# Patient Record
Sex: Female | Born: 1990
Health system: Southern US, Community
[De-identification: ages and names within clinical notes are randomized; demographics above are authoritative.]

## PROBLEM LIST (undated history)

## (undated) DIAGNOSIS — K219 Gastro-esophageal reflux disease without esophagitis: Secondary | ICD-10-CM

## (undated) DIAGNOSIS — B999 Unspecified infectious disease: Secondary | ICD-10-CM

## (undated) DIAGNOSIS — K589 Irritable bowel syndrome without diarrhea: Secondary | ICD-10-CM

## (undated) DIAGNOSIS — E669 Obesity, unspecified: Secondary | ICD-10-CM

## (undated) DIAGNOSIS — F32A Depression, unspecified: Secondary | ICD-10-CM

## (undated) DIAGNOSIS — T8859XA Other complications of anesthesia, initial encounter: Secondary | ICD-10-CM

## (undated) DIAGNOSIS — J189 Pneumonia, unspecified organism: Secondary | ICD-10-CM

## (undated) DIAGNOSIS — R569 Unspecified convulsions: Secondary | ICD-10-CM

## (undated) DIAGNOSIS — I10 Essential (primary) hypertension: Secondary | ICD-10-CM

## (undated) DIAGNOSIS — R7303 Prediabetes: Secondary | ICD-10-CM

## (undated) DIAGNOSIS — R519 Headache, unspecified: Secondary | ICD-10-CM

## (undated) DIAGNOSIS — J45909 Unspecified asthma, uncomplicated: Secondary | ICD-10-CM

## (undated) DIAGNOSIS — G4730 Sleep apnea, unspecified: Secondary | ICD-10-CM

## (undated) DIAGNOSIS — D649 Anemia, unspecified: Secondary | ICD-10-CM

## (undated) DIAGNOSIS — E7800 Pure hypercholesterolemia, unspecified: Secondary | ICD-10-CM

## (undated) DIAGNOSIS — F329 Major depressive disorder, single episode, unspecified: Secondary | ICD-10-CM

## (undated) DIAGNOSIS — R51 Headache: Secondary | ICD-10-CM

## (undated) DIAGNOSIS — N390 Urinary tract infection, site not specified: Secondary | ICD-10-CM

## (undated) DIAGNOSIS — F319 Bipolar disorder, unspecified: Secondary | ICD-10-CM

## (undated) DIAGNOSIS — A419 Sepsis, unspecified organism: Secondary | ICD-10-CM

## (undated) DIAGNOSIS — R160 Hepatomegaly, not elsewhere classified: Secondary | ICD-10-CM

## (undated) DIAGNOSIS — F419 Anxiety disorder, unspecified: Secondary | ICD-10-CM

## (undated) DIAGNOSIS — Z9889 Other specified postprocedural states: Secondary | ICD-10-CM

## (undated) DIAGNOSIS — J449 Chronic obstructive pulmonary disease, unspecified: Secondary | ICD-10-CM

## (undated) HISTORY — PX: CHOLECYSTECTOMY: SHX55

## (undated) HISTORY — PX: APPENDECTOMY: SHX54

## (undated) HISTORY — DX: Urinary tract infection, site not specified: N39.0

## (undated) HISTORY — PX: COLONOSCOPY: SHX174

## (undated) HISTORY — DX: Depression, unspecified: F32.A

## (undated) HISTORY — DX: Headache, unspecified: R51.9

## (undated) HISTORY — DX: Pure hypercholesterolemia, unspecified: E78.00

## (undated) HISTORY — DX: Major depressive disorder, single episode, unspecified: F32.9

## (undated) HISTORY — PX: DIAGNOSTIC LAPAROSCOPY: SUR761

## (undated) HISTORY — DX: Anemia, unspecified: D64.9

## (undated) HISTORY — DX: Headache: R51

## (undated) HISTORY — DX: Essential (primary) hypertension: I10

## (undated) HISTORY — DX: Obesity, unspecified: E66.9

## (undated) HISTORY — DX: Irritable bowel syndrome, unspecified: K58.9

## (undated) HISTORY — DX: Anxiety disorder, unspecified: F41.9

---
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
## 2010-02-12 ENCOUNTER — Ambulatory Visit (HOSPITAL_COMMUNITY): Payer: Self-pay | Admitting: Psychiatry

## 2010-06-03 ENCOUNTER — Ambulatory Visit (HOSPITAL_COMMUNITY): Payer: Self-pay | Admitting: Psychiatry

## 2010-07-13 ENCOUNTER — Ambulatory Visit (HOSPITAL_COMMUNITY): Payer: Self-pay | Admitting: Psychiatry

## 2010-08-05 ENCOUNTER — Ambulatory Visit (HOSPITAL_COMMUNITY)
Admission: RE | Admit: 2010-08-05 | Discharge: 2010-08-05 | Payer: Self-pay | Source: Home / Self Care | Attending: Psychiatry | Admitting: Psychiatry

## 2010-09-01 ENCOUNTER — Ambulatory Visit (INDEPENDENT_AMBULATORY_CARE_PROVIDER_SITE_OTHER): Payer: BC Managed Care – PPO | Admitting: Psychology

## 2010-09-01 DIAGNOSIS — F411 Generalized anxiety disorder: Secondary | ICD-10-CM

## 2010-09-15 ENCOUNTER — Encounter (HOSPITAL_COMMUNITY): Payer: BC Managed Care – PPO | Admitting: Psychology

## 2010-09-30 ENCOUNTER — Encounter (HOSPITAL_COMMUNITY): Payer: BC Managed Care – PPO | Admitting: Psychology

## 2010-10-13 ENCOUNTER — Encounter (HOSPITAL_COMMUNITY): Payer: BC Managed Care – PPO | Admitting: Psychology

## 2010-11-04 ENCOUNTER — Encounter (HOSPITAL_COMMUNITY): Payer: Self-pay | Admitting: Psychiatry

## 2010-11-13 ENCOUNTER — Encounter (INDEPENDENT_AMBULATORY_CARE_PROVIDER_SITE_OTHER): Payer: BC Managed Care – PPO | Admitting: Psychiatry

## 2010-11-13 DIAGNOSIS — F339 Major depressive disorder, recurrent, unspecified: Secondary | ICD-10-CM

## 2010-11-13 DIAGNOSIS — F411 Generalized anxiety disorder: Secondary | ICD-10-CM

## 2010-11-24 ENCOUNTER — Encounter (HOSPITAL_COMMUNITY): Payer: BC Managed Care – PPO | Admitting: Psychology

## 2010-12-02 ENCOUNTER — Encounter (INDEPENDENT_AMBULATORY_CARE_PROVIDER_SITE_OTHER): Payer: BC Managed Care – PPO | Admitting: Psychiatry

## 2010-12-02 DIAGNOSIS — F411 Generalized anxiety disorder: Secondary | ICD-10-CM

## 2010-12-04 ENCOUNTER — Encounter (HOSPITAL_COMMUNITY): Payer: BC Managed Care – PPO | Admitting: Psychology

## 2010-12-10 ENCOUNTER — Encounter (HOSPITAL_COMMUNITY): Payer: BC Managed Care – PPO | Admitting: Psychology

## 2011-01-04 ENCOUNTER — Encounter (HOSPITAL_COMMUNITY): Payer: BC Managed Care – PPO | Admitting: Psychiatry

## 2011-01-12 ENCOUNTER — Encounter (HOSPITAL_COMMUNITY): Payer: BC Managed Care – PPO | Admitting: Psychiatry

## 2011-01-13 ENCOUNTER — Encounter (INDEPENDENT_AMBULATORY_CARE_PROVIDER_SITE_OTHER): Payer: BC Managed Care – PPO | Admitting: Psychiatry

## 2011-01-13 DIAGNOSIS — F411 Generalized anxiety disorder: Secondary | ICD-10-CM

## 2011-04-07 ENCOUNTER — Encounter (INDEPENDENT_AMBULATORY_CARE_PROVIDER_SITE_OTHER): Payer: BC Managed Care – PPO | Admitting: Psychiatry

## 2011-04-07 DIAGNOSIS — F411 Generalized anxiety disorder: Secondary | ICD-10-CM

## 2011-05-06 ENCOUNTER — Encounter (INDEPENDENT_AMBULATORY_CARE_PROVIDER_SITE_OTHER): Payer: BC Managed Care – PPO | Admitting: Psychiatry

## 2011-05-06 DIAGNOSIS — F411 Generalized anxiety disorder: Secondary | ICD-10-CM

## 2011-06-14 ENCOUNTER — Other Ambulatory Visit (HOSPITAL_COMMUNITY): Payer: Self-pay | Admitting: Psychiatry

## 2011-06-14 DIAGNOSIS — Q2545 Double aortic arch: Secondary | ICD-10-CM

## 2011-06-14 MED ORDER — DIVALPROEX SODIUM ER 500 MG PO TB24: 1000.0000 mg | ORAL_TABLET | Freq: Every day | ORAL | Status: DC

## 2011-06-21 ENCOUNTER — Other Ambulatory Visit (HOSPITAL_COMMUNITY): Payer: Self-pay | Admitting: Psychiatry

## 2011-06-21 DIAGNOSIS — Q2545 Double aortic arch: Secondary | ICD-10-CM

## 2011-06-21 MED ORDER — DIVALPROEX SODIUM ER 500 MG PO TB24: 1000.0000 mg | ORAL_TABLET | Freq: Every day | ORAL | Status: DC

## 2011-07-02 ENCOUNTER — Encounter (HOSPITAL_COMMUNITY): Payer: Self-pay

## 2011-07-06 ENCOUNTER — Ambulatory Visit (INDEPENDENT_AMBULATORY_CARE_PROVIDER_SITE_OTHER): Payer: BC Managed Care – PPO | Admitting: Psychiatry

## 2011-07-06 VITALS — BP 98/62 | Ht 59.5 in | Wt 140.0 lb

## 2011-07-06 DIAGNOSIS — F411 Generalized anxiety disorder: Secondary | ICD-10-CM

## 2011-07-06 MED ORDER — CLONAZEPAM 0.5 MG PO TABS: 0.2500 mg | ORAL_TABLET | Freq: Two times a day (BID) | ORAL | Status: DC | PRN

## 2011-07-06 NOTE — Progress Notes (Signed)
   [facility] Health Follow-up Outpatient Visit  Jacqueline Jones [facility] 1991/02/08   Subjective: The patient is a 20 year old female who has been followed by [facility] since September of 2010. She is currently diagnosed with generalized anxiety disorder. Patient had multiple hospitalizations throughout the years. Most recently her mom was diagnosed with lung cancer. She presents today with her stepfather. Her mom is currently in remission. The family only found out this last week. Mom still has issues with her left leg which turned gangrenous with insertion of port. The patient presents with her stepfather who she call the police on 2 days ago. According to stepfather the patient will call either mom or stepdad 30-60 times a day. She will not leave a message. There was a health issue with mom last week. Patient reportedly called sister in [place] and scared her. The patient was up all night on the phone. Stepdad got very frustrated with it. He tried to take the phone away. Patient called police on stepdad. Patient was advised to stay away from stepdad for 48 hour period. She is now staying with her boyfriend for 48 hours. The patient did quit smoking a few weeks ago. She has not stolen anymore medications from her mother. The patient does complain today that her Ativan makes her shake. She does have a mild essential tremor. The patient feels like her anxiety is under control at this point. Another concern of stepdad at this point is the patient will wake mom up frequently. Sometimes in the middle in night. Patient has no boundaries.  Filed Vitals:   07/06/11 1030  BP: 98/62    Mental Status Examination  Appearance: Casual, in her pajamas Alert: Yes Attention: fair  Cooperative: Yes Eye Contact: Good Speech: Regular rate rhythm and volume Psychomotor Activity: Normal Memory/Concentration: Intact Oriented: person, place, time/date and situation Mood: Euthymic Affect:  Restricted Thought Processes and Associations: Logical Fund of Knowledge: Fair Thought Content: No suicidal or homicidal thoughts Insight: Fair Judgement: Fair  Diagnosis: Generalized anxiety disorder  Treatment Plan: At this point I will stop her Ativan. I would her Klonopin 0.25 mg twice a day. Patient is advised that I will not go any higher than this. Family meeting. If things do not resolve will refer them to [facility] for family therapy. Mom is to work harder to set boundaries.  Jamse Mead, MD

## 2011-07-21 ENCOUNTER — Other Ambulatory Visit (HOSPITAL_COMMUNITY): Payer: Self-pay | Admitting: Psychiatry

## 2011-07-21 MED ORDER — TRAZODONE HCL 100 MG PO TABS: 300.0000 mg | ORAL_TABLET | Freq: Every day | ORAL | Status: DC

## 2011-07-23 ENCOUNTER — Telehealth (HOSPITAL_COMMUNITY): Payer: Self-pay

## 2011-07-23 NOTE — Telephone Encounter (Signed)
Return phone call the patient. Patient was only be taking one half Klonopin twice a day. She was taking a full one twice a day. She is out early. I documented that I discussed with her that I would not be increasing her Klonopin. Patient is advised that she will not give any early refills. She is to take her medication as prescribed. I will see her back for one more appointment. If she abuse her medication again she will be discharged to practice.

## 2011-08-02 ENCOUNTER — Other Ambulatory Visit (HOSPITAL_COMMUNITY): Payer: Self-pay | Admitting: Psychiatry

## 2011-08-02 MED ORDER — ARIPIPRAZOLE 5 MG PO TABS: 5.0000 mg | ORAL_TABLET | Freq: Every day | ORAL | Status: DC

## 2011-08-06 ENCOUNTER — Telehealth (HOSPITAL_COMMUNITY): Payer: Self-pay

## 2011-08-06 NOTE — Telephone Encounter (Signed)
Says klonopin is not working and that mom has cancer and isn't doing well. Says needs change in medication

## 2011-08-06 NOTE — Telephone Encounter (Signed)
Return phone call to mom. We'll not change medication. Call with concerns.

## 2011-08-13 ENCOUNTER — Telehealth (HOSPITAL_COMMUNITY): Payer: Self-pay

## 2011-08-13 DIAGNOSIS — F411 Generalized anxiety disorder: Secondary | ICD-10-CM

## 2011-08-13 NOTE — Telephone Encounter (Signed)
PT WANTS TO BE SEEN SOONER THAN Tuesday BECAUSE SHE HAS BEEN SHAKING SO MUCH AND SHE CAN'T TAKE IT. SHE DOESN'T KNOW WHAT IT IS FROM. PLEASE CALL ASAP. THANKS

## 2011-08-16 MED ORDER — CLONAZEPAM 0.5 MG PO TABS: 0.5000 mg | ORAL_TABLET | Freq: Two times a day (BID) | ORAL | Status: DC

## 2011-08-16 NOTE — Telephone Encounter (Signed)
Return phone call to patient. Patient is shaking and can't help it. She is asking for something. I will double her Klonopin. I advised I will not go any higher. If I discovered that she is abusing it, she will be discharged from his practice.

## 2011-08-17 ENCOUNTER — Ambulatory Visit (HOSPITAL_COMMUNITY): Payer: Self-pay | Admitting: Psychiatry

## 2011-08-18 ENCOUNTER — Telehealth (HOSPITAL_COMMUNITY): Payer: Self-pay

## 2011-08-18 NOTE — Telephone Encounter (Signed)
Patient called again while I was main office. Spoke to her. Advised her that I only change her Klonopin 2 days ago. Patient also canceled appointment yesterday which was a same-day canceled. This is considered to be a no-show. I will not make any changes at this time. I will see patient next week.

## 2011-09-02 ENCOUNTER — Encounter (HOSPITAL_COMMUNITY): Payer: Self-pay | Admitting: Psychiatry

## 2011-09-02 ENCOUNTER — Ambulatory Visit (INDEPENDENT_AMBULATORY_CARE_PROVIDER_SITE_OTHER): Payer: Medicaid Other | Admitting: Psychiatry

## 2011-09-02 VITALS — BP 98/62 | Ht 59.5 in | Wt 151.0 lb

## 2011-09-02 DIAGNOSIS — F411 Generalized anxiety disorder: Secondary | ICD-10-CM | POA: Insufficient documentation

## 2011-09-02 MED ORDER — DOXEPIN HCL 10 MG PO CAPS: 10.0000 mg | ORAL_CAPSULE | Freq: Every day | ORAL | Status: DC

## 2011-09-02 NOTE — Progress Notes (Signed)
   [facility] Follow-up Outpatient Visit  Emilyann Banka [facility] 1990/11/27   Subjective: The patient is a 21 year old female who has been followed by [facility] since September of 2010. She is currently diagnosed with generalized anxiety disorder. The patient's last appointment, I switched her from the Ativan back to the Klonopin secondary to anxiety. I told her I would not go any higher than a quarter milligram twice a day. Patient is called 5 times in the interim. I did increase the Klonopin to 0.5 twice a day. She presents today by herself. She reports she is more depressed. She broke up with her boyfriend approximately 3 months ago. She's been staying home and not going out. She's been crying a lot and shaking. She denies any suicidal thoughts. She has not been sleeping well and is not having any. She has an increase in appetite and is up 11 pounds. She is not currently on an antidepressant. She is concerned about mom, because she feels that mom is abusing her pain medication.  Filed Vitals:   09/02/11 1437  BP: 98/62    Mental Status Examination  Appearance: Casual Attention: fair  Cooperative: Yes Eye Contact: Good Speech: Regular rate rhythm and volume Psychomotor Activity: Normal Memory/Concentration: Intact Oriented: person, place, time/date and situation Mood: Euthymic Affect: Restricted Thought Processes and Associations: Logical Fund of Knowledge: Fair Thought Content: No suicidal or homicidal thoughts Insight: Fair Judgement: Fair  Diagnosis: Generalized anxiety disorder  Treatment Plan: We will continue the Klonopin. I have advised patient will not be switching her benzo nor increasing it. We will also continue her Abilify and Depakote ER. I will add doxepin 10 mg daily. I will see her back in one month. Jamse Mead, MD

## 2011-09-29 ENCOUNTER — Other Ambulatory Visit (HOSPITAL_COMMUNITY): Payer: Self-pay | Admitting: Psychiatry

## 2011-09-29 DIAGNOSIS — F411 Generalized anxiety disorder: Secondary | ICD-10-CM

## 2011-09-29 MED ORDER — CLONAZEPAM 0.5 MG PO TABS: 0.5000 mg | ORAL_TABLET | Freq: Two times a day (BID) | ORAL | Status: DC

## 2011-09-30 ENCOUNTER — Encounter (HOSPITAL_COMMUNITY): Payer: Self-pay | Admitting: Psychiatry

## 2011-09-30 ENCOUNTER — Ambulatory Visit (INDEPENDENT_AMBULATORY_CARE_PROVIDER_SITE_OTHER): Payer: Medicaid Other | Admitting: Psychiatry

## 2011-09-30 VITALS — BP 112/72 | Ht 59.5 in | Wt 148.0 lb

## 2011-09-30 DIAGNOSIS — F411 Generalized anxiety disorder: Secondary | ICD-10-CM

## 2011-09-30 MED ORDER — CLONIDINE HCL 0.1 MG PO TABS: 0.1000 mg | ORAL_TABLET | Freq: Every day | ORAL | Status: DC

## 2011-09-30 NOTE — Progress Notes (Signed)
   [facility] Follow-up Outpatient Visit  Jacqueline Jones [facility] Nov 19, 1990   Subjective: The patient is a 21 year old female who has been followed by [facility] since September of 2010. She is currently diagnosed with generalized anxiety disorder. Patient last appointment, I continued her Klonopin, Abilify, and Depakote. I started her on doxepin 10 mg daily. The patient presents today looking like she is taking care of herself. She recently got her hair dyed and cut. She is wearing makeup. She is not wearing pajamas. She reports that her anxiety is under control. She "loves the new medicine". It does not make her tired. She is concerned today because she states that her mother is popping pain pills. Her mother has been falling. Patient realizes she is addicted but mother continues to use. Patient is not sleeping at night. She has tried trazodone up to 300 mg at bedtime which didn't help. Remeron caused weight gain. Melatonin and Benadryl did not help.  Filed Vitals:   09/30/11 1419  BP: 112/72    Mental Status Examination  Appearance: Casual Attention: fair  Cooperative: Yes Eye Contact: Good Speech: Regular rate rhythm and volume Psychomotor Activity: Normal Memory/Concentration: Intact Oriented: person, place, time/date and situation Mood: Euthymic Affect: Restricted Thought Processes and Associations: Logical Fund of Knowledge: Fair Thought Content: No suicidal or homicidal thoughts Insight: Fair Judgement: Fair  Diagnosis: Generalized anxiety disorder  Treatment Plan: We will continue the Klonopin, Abilify, Depakote, and doxepin. I will add clonidine 0.1 mg at night to help with sleep. I will see her back in one month. Patient to call with concerns. Jamse Mead, MD

## 2011-11-01 ENCOUNTER — Telehealth (HOSPITAL_COMMUNITY): Payer: Self-pay

## 2011-11-01 NOTE — Telephone Encounter (Signed)
Mom called report the patient is severely depressed. She walks around crying all the time. She continues to isolate. I gave home from dinner the other night, and the patient had drank a bottle. He along with a bottle of Bailey's. Mom sees this as self-medicating. Mom is asking if we can try changing her medication or going up on the Prozac. Patient is to keep her appointment.

## 2011-11-01 NOTE — Telephone Encounter (Signed)
Mom needs to speak to you before appt on Wednesday would not disclose what about

## 2011-11-03 ENCOUNTER — Ambulatory Visit (INDEPENDENT_AMBULATORY_CARE_PROVIDER_SITE_OTHER): Payer: Medicaid Other | Admitting: Psychiatry

## 2011-11-03 ENCOUNTER — Encounter (HOSPITAL_COMMUNITY): Payer: Self-pay | Admitting: Psychiatry

## 2011-11-03 VITALS — BP 98/62 | Ht 59.5 in | Wt 150.0 lb

## 2011-11-03 DIAGNOSIS — F411 Generalized anxiety disorder: Secondary | ICD-10-CM

## 2011-11-03 DIAGNOSIS — F39 Unspecified mood [affective] disorder: Secondary | ICD-10-CM

## 2011-11-03 LAB — COMPREHENSIVE METABOLIC PANEL
ALT: 9 U/L (ref 0–35)
BUN: 13 mg/dL (ref 6–23)
CO2: 24 mEq/L (ref 19–32)
Creat: 0.78 mg/dL (ref 0.50–1.10)
Total Bilirubin: 0.3 mg/dL (ref 0.3–1.2)

## 2011-11-03 LAB — LIPID PANEL
HDL: 49 mg/dL (ref >39)
LDL Cholesterol: 112 mg/dL — ABNORMAL HIGH (ref 0–99)
Triglycerides: 96 mg/dL (ref <150)

## 2011-11-03 LAB — CBC WITH DIFFERENTIAL/PLATELET
Hemoglobin: 14.1 g/dL (ref 12.0–15.0)
Lymphocytes Relative: 33 % (ref 12–46)
Lymphs Abs: 2.3 10*3/uL (ref 0.7–4.0)
MCH: 29.1 pg (ref 26.0–34.0)
Monocytes Relative: 7 % (ref 3–12)
Neutro Abs: 4 10*3/uL (ref 1.7–7.7)
Neutrophils Relative %: 57 % (ref 43–77)
RBC: 4.84 MIL/uL (ref 3.87–5.11)
WBC: 7.1 10*3/uL (ref 4.0–10.5)

## 2011-11-03 LAB — HEMOGLOBIN A1C
Hgb A1c MFr Bld: 5.1 % (ref <5.7)
Mean Plasma Glucose: 100 mg/dL (ref <117)

## 2011-11-03 MED ORDER — DOXEPIN HCL 10 MG PO CAPS
10.0000 mg | ORAL_CAPSULE | Freq: Two times a day (BID) | ORAL | Status: DC
Start: 2011-11-03 — End: 2012-02-08

## 2011-11-03 MED ORDER — MIRTAZAPINE 15 MG PO TBDP: 15.0000 mg | ORAL_TABLET | Freq: Every day | ORAL | Status: DC

## 2011-11-03 NOTE — Progress Notes (Signed)
   [facility] Health Follow-up Outpatient Visit  Lavina Resor [facility] [birth year]   Subjective: The patient is a 21 year old female who has been followed by [facility] since September of 2010. She is currently diagnosed with generalized anxiety disorder. Her last appointment, I continued her Klonopin, Abilify, Depakote, and doxepin. I added clonidine to help with insomnia. Mom called yesterday and reported the patient was severely depressed and that I needed to go up on her antidepressant. Patient is not on an antidepressant. Patient presents today by herself. She stopped the clonidine because it didn't help her sleep. She does report that she has been very depressed. She's been mopey and sad. There've been no suicidal thoughts. We discussed the fact that Remeron helped her sleep in the past but caused to eat more. She is willing to try it again. I also would do double duty as an antidepressant. She feels that doxepin is helping a little bit. She reports that she went to the emergency room with back pain last night. They gave her a cortisol shot and Ativan to calm down.  Filed Vitals:   11/03/11 0958  BP: 98/62    Mental Status Examination  Appearance: Casual Attention: fair  Cooperative: Yes Eye Contact: Good Speech: Regular rate rhythm and volume Psychomotor Activity: Normal Memory/Concentration: Intact Oriented: person, place, time/date and situation Mood: Euthymic Affect: Restricted Thought Processes and Associations: Logical Fund of Knowledge: Fair Thought Content: No suicidal or homicidal thoughts Insight: Fair Judgement: Fair  Diagnosis: Generalized anxiety disorder  Treatment Plan: We will continue the Klonopin, Abilify, Depakote, and doxepin. We will discontinue the clonidine, since patient is not taking it. We will start Remeron 15 mg at bedtime. Patient may try half tablet first. I will increase her doxepin to twice a day. Patient is not eating this  morning. We will go ahead and get a Depakote level, CBC, CMP, hemoglobin A1c, and lipid panel. I will see the patient back in 2 months. Jamse Mead, MD

## 2011-11-04 LAB — VALPROIC ACID LEVEL: Valproic Acid Lvl: <1 ug/mL — ABNORMAL LOW (ref 50.0–100.0)

## 2011-11-16 ENCOUNTER — Telehealth (HOSPITAL_COMMUNITY): Payer: Self-pay

## 2011-11-16 NOTE — Telephone Encounter (Signed)
Called patient to review labs. Patient reports she has not been taking her Depakote. We will discontinue it. She states that she will work on her cholesterol level. We reviewed medications.

## 2011-11-16 NOTE — Telephone Encounter (Signed)
Would like results of blood work

## 2012-01-03 ENCOUNTER — Other Ambulatory Visit (HOSPITAL_COMMUNITY): Payer: Self-pay | Admitting: Psychiatry

## 2012-01-03 DIAGNOSIS — F411 Generalized anxiety disorder: Secondary | ICD-10-CM

## 2012-01-03 MED ORDER — CLONAZEPAM 0.5 MG PO TABS: 0.5000 mg | ORAL_TABLET | Freq: Two times a day (BID) | ORAL | Status: DC

## 2012-01-04 ENCOUNTER — Encounter (HOSPITAL_COMMUNITY): Payer: Self-pay | Admitting: Psychiatry

## 2012-01-04 ENCOUNTER — Ambulatory Visit (INDEPENDENT_AMBULATORY_CARE_PROVIDER_SITE_OTHER): Payer: Medicaid Other | Admitting: Psychiatry

## 2012-01-04 VITALS — BP 100/62 | Ht 59.5 in | Wt 149.0 lb

## 2012-01-04 DIAGNOSIS — F411 Generalized anxiety disorder: Secondary | ICD-10-CM

## 2012-01-04 MED ORDER — ARIPIPRAZOLE 5 MG PO TABS: 5.0000 mg | ORAL_TABLET | Freq: Every day | ORAL | Status: DC

## 2012-01-04 NOTE — Progress Notes (Signed)
   [facility] Health Follow-up Outpatient Visit  Jacqueline Jones [facility] 1991-05-06   Subjective: The patient is a 21 year old female who has been followed by [facility] since September of 2010. She is currently diagnosed with generalized anxiety disorder. At her last appointment, I increased her doxepin to twice a day. I also started her on Remeron 15 mg at bedtime to help with sleep. I continued her Klonopin and Abilify. We had basic blood work done. Her Depakote level was very low. She reports that she hasn't been taking it. The patient presents today in tears. Her mother's cancer is back. The mother has been given 1 months to live. The patient has been removed from mom's home, and is living with her 56 year old sister. Stepdad does not want her there. The patient reports that she is very upset. She has been taking the Remeron, because it caused weight gain. She is actually down a pound today. Her sister is on Xanax, and has been given the patient some to calm her down.  Filed Vitals:   01/04/12 1051  BP: 100/62    Mental Status Examination  Appearance: Casual Attention: fair  Cooperative: Yes Eye Contact: Good Speech: Regular rate rhythm and volume Psychomotor Activity: Normal Memory/Concentration: Intact Oriented: person, place, time/date and situation Mood: Euthymic Affect: Restricted Thought Processes and Associations: Logical Fund of Knowledge: Fair Thought Content: No suicidal or homicidal thoughts Insight: Fair Judgement: Fair  Diagnosis: Generalized anxiety disorder  Treatment Plan: We will continue the Klonopin, Abilify,and doxepin. I will check on the patient one month. She is advised that she may take Remeron to sporadically if she needs it for sleep. She may call me if necessary.Marland Kitchen   Jamse Mead, MD

## 2012-01-09 ENCOUNTER — Encounter (HOSPITAL_COMMUNITY): Payer: Self-pay | Admitting: *Deleted

## 2012-01-09 ENCOUNTER — Emergency Department (HOSPITAL_COMMUNITY)
Admission: EM | Admit: 2012-01-09 | Discharge: 2012-01-09 | Disposition: A | Discharge status: Home / Self Care | Payer: Medicaid Other | Attending: Emergency Medicine | Admitting: Emergency Medicine

## 2012-01-09 DIAGNOSIS — R109 Unspecified abdominal pain: Secondary | ICD-10-CM

## 2012-01-09 DIAGNOSIS — N3090 Cystitis, unspecified without hematuria: Secondary | ICD-10-CM | POA: Insufficient documentation

## 2012-01-09 DIAGNOSIS — F329 Major depressive disorder, single episode, unspecified: Secondary | ICD-10-CM | POA: Insufficient documentation

## 2012-01-09 DIAGNOSIS — F3289 Other specified depressive episodes: Secondary | ICD-10-CM | POA: Insufficient documentation

## 2012-01-09 DIAGNOSIS — F411 Generalized anxiety disorder: Secondary | ICD-10-CM | POA: Insufficient documentation

## 2012-01-09 LAB — URINALYSIS, ROUTINE W REFLEX MICROSCOPIC
Nitrite: NEGATIVE
Specific Gravity, Urine: 1.027 (ref 1.005–1.030)
Urobilinogen, UA: 0.2 mg/dL (ref 0.0–1.0)
pH: 6 (ref 5.0–8.0)

## 2012-01-09 LAB — URINE MICROSCOPIC-ADD ON

## 2012-01-09 MED ORDER — OXYCODONE-ACETAMINOPHEN 5-325 MG PO TABS
2.0000 | ORAL_TABLET | Freq: Once | ORAL | Status: AC
  Administered 2012-01-09: 2 via ORAL
  Filled 2012-01-09: qty 2

## 2012-01-09 MED ORDER — TRAMADOL HCL 50 MG PO TABS: 50.0000 mg | ORAL_TABLET | Freq: Four times a day (QID) | ORAL | Status: DC | PRN

## 2012-01-09 MED ORDER — TRAMADOL HCL 50 MG PO TABS: 50.0000 mg | ORAL_TABLET | Freq: Four times a day (QID) | ORAL | Status: AC | PRN

## 2012-01-09 NOTE — Discharge Instructions (Signed)
Flank Pain Flank pain refers to pain that is located on the side of the body between the upper abdomen and the back. It can be caused by many things. CAUSES  Some of the more common causes of flank pain include:  Muscle strain.   Muscle spasms.   A disease of your spine (vertebral disk disease).   A lung infection (pneumonia).   Fluid around your lungs (pulmonary edema).   A kidney infection.   Kidney stones.   A very painful skin rash on only one side of your body (shingles).   Gallbladder disease.  DIAGNOSIS  Blood tests, urine tests, and X-rays may help your caregiver determine what is wrong. TREATMENT  The treatment of pain depends on the cause. Your caregiver will determine what treatment will work best for you. HOME CARE INSTRUCTIONS   Home care will depend on the cause of your pain.   Some medications may help relieve the pain. Take medication for relief of pain as directed by your caregiver.   Tell your caregiver about any changes in your pain.   Follow up with your caregiver.  SEEK IMMEDIATE MEDICAL CARE IF:   Your pain is not controlled with medication.   The pain increases.   You have abdominal pain.   You have shortness of breath.   You have persistent nausea or vomiting.   You have swelling in your abdomen.   You feel faint or pass out.   You have a temperature by mouth above 102 F (38.9 C), not controlled by medicine.  MAKE SURE YOU:   Understand these instructions.   Will watch your condition.   Will get help right away if you are not doing well or get worse.  Document Released: 09/02/2005 Document Revised: 07/01/2011 Document Reviewed: 12/27/2009 Exit[employer] Patient Information 2012 [employer]. 

## 2012-01-09 NOTE — ED Provider Notes (Signed)
History     CSN: [id number]  Arrival date & time 01/09/12  2128   First MD Initiated Contact with Patient 01/09/12 2243      Chief Complaint  Patient presents with  . Cystitis    (Consider location/radiation/quality/duration/timing/severity/associated sxs/prior treatment) The history is provided by the patient.   Patient here with flank pain x2 weeks. She has been taking antibiotics for urinary tract infection. Denies any fever, vomiting, vaginal bleeding or discharge. No current dysuria or hematuria. No prior history of renal colic. She has 3 days left of her antibiotic to take. She denies any pain medication currently Past Medical History  Diagnosis Date  . Anxiety   . Depression   . Anxiety   . Depression     History reviewed. No pertinent past surgical history.  Family History  Problem Relation Age of Onset  . Drug abuse Father   . Drug abuse Sister   . Drug abuse Brother     History  Substance Use Topics  . Smoking status: Never Smoker   . Smokeless tobacco: Never Used  . Alcohol Use: No    OB History    Grav Para Term Preterm Abortions TAB SAB Ect Mult Living                  Review of Systems  All other systems reviewed and are negative.    Allergies  Adhesive; Gabapentin; and Latex  Home Medications   Current Outpatient Rx  Name Route Sig Dispense Refill  . ARIPIPRAZOLE 5 MG PO TABS Oral Take 1 tablet (5 mg total) by mouth daily. 30 tablet 2  . CLONAZEPAM 0.5 MG PO TABS Oral Take 1 tablet (0.5 mg total) by mouth 2 (two) times daily. 60 tablet 1  . DOXEPIN HCL 10 MG PO CAPS Oral Take 1 capsule (10 mg total) by mouth 2 (two) times daily. 60 capsule 2  . IBUPROFEN 200 MG PO TABS Oral Take 200 mg by mouth every 6 (six) hours as needed. Pain    . CLONAZEPAM 0.5 MG PO TABS Oral Take 0.5 tablets (0.25 mg total) by mouth 2 (two) times daily as needed for anxiety. 30 tablet 2    BP 118/80  Pulse 105  Temp 98.4 F (36.9 C) (Oral)  Resp 16  Ht 5'  1" (1.549 m)  Wt 152 lb (68.947 kg)  BMI 28.72 kg/m2  SpO2 97%  LMP 12/20/2011  Physical Exam  Nursing note and vitals reviewed. Constitutional: She is oriented to person, place, and time. She appears well-developed and well-nourished.  Non-toxic appearance. No distress.  HENT:  Head: Normocephalic and atraumatic.  Eyes: Conjunctivae, EOM and lids are normal. Pupils are equal, round, and reactive to light.  Neck: Normal range of motion. Neck supple. No tracheal deviation present. No mass present.  Cardiovascular: Regular rhythm and normal heart sounds.  Tachycardia present.  Exam reveals no gallop.   No murmur heard. Pulmonary/Chest: Effort normal and breath sounds normal. No stridor. No respiratory distress. She has no decreased breath sounds. She has no wheezes. She has no rhonchi. She has no rales.  Abdominal: Soft. Normal appearance and bowel sounds are normal. She exhibits no distension. There is no tenderness. There is no rigidity, no rebound, no guarding and no CVA tenderness.  Musculoskeletal: Normal range of motion. She exhibits no edema and no tenderness.  Neurological: She is alert and oriented to person, place, and time. She has normal strength. No cranial nerve deficit or sensory deficit.  GCS eye subscore is 4. GCS verbal subscore is 5. GCS motor subscore is 6.  Skin: Skin is warm and dry. No abrasion and no rash noted.  Psychiatric: She has a normal mood and affect. Her speech is normal and behavior is normal.    ED Course  Procedures (including critical care time)  Labs Reviewed  URINALYSIS, ROUTINE W REFLEX MICROSCOPIC - Abnormal; Notable for the following:    APPearance CLOUDY (*)     Leukocytes, UA SMALL (*)     All other components within normal limits  URINE MICROSCOPIC-ADD ON - Abnormal; Notable for the following:    Squamous Epithelial / LPF MANY (*)     Bacteria, UA FEW (*)     All other components within normal limits  URINE CULTURE   No results  found.   No diagnosis found.    MDM  Pt given percocet for pain--urine culture sent, current ua likely contaminated--will f/u her pcp--urine without blood, doubt renal colic, no concern for pyelo        Toy Baker, MD 01/09/12 2259

## 2012-01-09 NOTE — ED Notes (Signed)
Patient from home with c/o bladder infection. Tenderness upon palpation of both flanks. Sharp pain 10/10 non radiating. No urgency, frequency or problems with urination. Patient currently on cipro for bladder infection diagnosed 2 weeks ago.

## 2012-01-12 LAB — URINE CULTURE

## 2012-01-13 NOTE — ED Notes (Signed)
+   Urine Chart sent to EDP office for review. 

## 2012-01-14 NOTE — ED Notes (Signed)
Chart returned from EDP office. Prescribed Macrobid 100 mg. One tablet po BID x 5 days. Prescribed by Heather VanWingen PA-C. 

## 2012-01-15 NOTE — ED Notes (Signed)
Attempted to contact patient. # is not correct.

## 2012-02-04 ENCOUNTER — Encounter (HOSPITAL_COMMUNITY): Payer: Self-pay | Admitting: Psychiatry

## 2012-02-04 ENCOUNTER — Ambulatory Visit (INDEPENDENT_AMBULATORY_CARE_PROVIDER_SITE_OTHER): Payer: Medicaid Other | Admitting: Psychiatry

## 2012-02-04 VITALS — BP 102/64 | Ht 59.5 in | Wt 156.0 lb

## 2012-02-04 DIAGNOSIS — F411 Generalized anxiety disorder: Secondary | ICD-10-CM

## 2012-02-04 NOTE — Progress Notes (Signed)
   [facility] Health Follow-up Outpatient Visit  Burnadette Baskett [facility] June 20, 1991   Subjective: The patient is a 21 year old female who has been followed by [facility] since September of 2010. She is currently diagnosed with generalized anxiety disorder. At her last appointment, I did not make any changes. She presents today alone. Her mom is now in hospice. She is living at the hospice facility. The patient is living with her sister. Her sister has 3 stepchildren in their late teens early 15s, and one son 68. The patient has her own room there. She likes being there. She has seen her stepdad some, but her stepdad does not want her living at the house. She feels more depressed and anxious. 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 She is now back with her boyfriend. This appears to be going well. She is willing to try to go up on the doxepin.  Filed Vitals:   02/04/12 1124  BP: 102/64    Mental Status Examination  Appearance: Casual Attention: fair  Cooperative: Yes Eye Contact: Good Speech: Regular rate rhythm and volume Psychomotor Activity: Normal Memory/Concentration: Intact Oriented: person, place, time/date and situation Mood: Euthymic Affect: Restricted Thought Processes and Associations: Logical Fund of Knowledge: Fair Thought Content: No suicidal or homicidal thoughts Insight: Fair Judgement: Fair  Diagnosis: Generalized anxiety disorder  Treatment Plan: I will increase the patient's doxepin to 20 mg twice a day. We will continue the Klonopin, Abilify, and trazodone the patient is taking. I will see her back in one month.  Jamse Mead, MD

## 2012-02-08 ENCOUNTER — Other Ambulatory Visit (HOSPITAL_COMMUNITY): Payer: Self-pay | Admitting: Psychiatry

## 2012-02-24 ENCOUNTER — Ambulatory Visit (INDEPENDENT_AMBULATORY_CARE_PROVIDER_SITE_OTHER): Payer: Medicaid Other | Admitting: Behavioral Health

## 2012-02-24 ENCOUNTER — Telehealth (HOSPITAL_COMMUNITY): Payer: Self-pay

## 2012-02-24 DIAGNOSIS — F411 Generalized anxiety disorder: Secondary | ICD-10-CM

## 2012-02-24 NOTE — Telephone Encounter (Signed)
Patient is scheduled with Dr. Demetrius Charity. on Monday.

## 2012-02-25 ENCOUNTER — Encounter (HOSPITAL_COMMUNITY): Payer: Self-pay | Admitting: Behavioral Health
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
## 2012-02-28 ENCOUNTER — Ambulatory Visit (INDEPENDENT_AMBULATORY_CARE_PROVIDER_SITE_OTHER): Payer: Medicaid Other | Admitting: Psychiatry

## 2012-02-28 ENCOUNTER — Ambulatory Visit (HOSPITAL_COMMUNITY): Payer: Self-pay | Admitting: Psychology

## 2012-02-28 ENCOUNTER — Encounter (HOSPITAL_COMMUNITY): Payer: Self-pay | Admitting: Psychiatry

## 2012-02-28 VITALS — BP 122/80 | HR 99 | Ht 59.5 in | Wt 152.0 lb

## 2012-02-28 DIAGNOSIS — F411 Generalized anxiety disorder: Secondary | ICD-10-CM

## 2012-02-28 DIAGNOSIS — F1920 Other psychoactive substance dependence, uncomplicated: Secondary | ICD-10-CM | POA: Insufficient documentation

## 2012-02-28 MED ORDER — CLONAZEPAM 0.5 MG PO TABS: ORAL_TABLET | ORAL | Status: DC

## 2012-02-28 MED ORDER — HYDROXYZINE PAMOATE 25 MG PO CAPS: 25.0000 mg | ORAL_CAPSULE | Freq: Three times a day (TID) | ORAL | Status: AC | PRN

## 2012-02-28 MED ORDER — ARIPIPRAZOLE 10 MG PO TABS: 10.0000 mg | ORAL_TABLET | Freq: Every day | ORAL | Status: DC
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
## 2012-03-06 ENCOUNTER — Ambulatory Visit (HOSPITAL_COMMUNITY): Payer: Self-pay | Admitting: Psychiatry

## 2012-03-13 ENCOUNTER — Ambulatory Visit (HOSPITAL_COMMUNITY): Payer: Self-pay | Admitting: Behavioral Health

## 2012-03-17 ENCOUNTER — Other Ambulatory Visit (HOSPITAL_COMMUNITY): Payer: Self-pay | Admitting: Psychiatry

## 2012-03-17 ENCOUNTER — Telehealth (HOSPITAL_COMMUNITY): Payer: Self-pay

## 2012-03-17 MED ORDER — DOXEPIN HCL 10 MG PO CAPS: 40.0000 mg | ORAL_CAPSULE | Freq: Two times a day (BID) | ORAL | Status: DC

## 2012-03-17 NOTE — Telephone Encounter (Signed)
Double Doxepin.  Check in next week.

## 2012-03-17 NOTE — Telephone Encounter (Signed)
Hydroxyzine is hyping patient up. We will stop it.

## 2012-03-21 ENCOUNTER — Telehealth (HOSPITAL_COMMUNITY): Payer: Self-pay

## 2012-03-21 NOTE — Telephone Encounter (Signed)
Called patient, she denies any SI/HI/AVH. She reports some "jitteriness" and "agitation".  She believes it is most likely due to her taper of clonazepam.  PLAN:  Taper doxepin to original dose of 10 mg BID, by 10 mg daily.  Will ascertain if a different AA is required.

## 2012-03-21 NOTE — Telephone Encounter (Signed)
Needs to discuss meds 

## 2012-03-24 ENCOUNTER — Telehealth (HOSPITAL_COMMUNITY): Payer: Self-pay

## 2012-03-24 MED ORDER — BUSPIRONE HCL 10 MG PO TABS: 10.0000 mg | ORAL_TABLET | Freq: Three times a day (TID) | ORAL | Status: DC

## 2012-03-24 NOTE — Telephone Encounter (Signed)
The patient's mother reports that the patient is not sleeping and appears to be depressed. Patient and patient's mother would like a medication change.   1. Increase trazodone 250 mg 2. Abilify 15 mg daily 3. Start Buspirone 10 mg BID.

## 2012-03-28 ENCOUNTER — Encounter (HOSPITAL_COMMUNITY): Payer: Self-pay | Admitting: Behavioral Health

## 2012-03-28 ENCOUNTER — Ambulatory Visit (HOSPITAL_COMMUNITY): Payer: Self-pay | Admitting: Psychiatry

## 2012-03-29 ENCOUNTER — Telehealth (HOSPITAL_COMMUNITY): Payer: Self-pay | Admitting: Behavioral Health

## 2012-03-29 ENCOUNTER — Encounter (HOSPITAL_COMMUNITY): Payer: Self-pay | Admitting: Behavioral Health

## 2012-03-29 NOTE — Telephone Encounter (Signed)
There was no actual phone call made.

## 2012-03-31 ENCOUNTER — Telehealth (HOSPITAL_COMMUNITY): Payer: Self-pay | Admitting: Psychiatry

## 2012-03-31 DIAGNOSIS — G4700 Insomnia, unspecified: Secondary | ICD-10-CM

## 2012-03-31 DIAGNOSIS — F411 Generalized anxiety disorder: Secondary | ICD-10-CM

## 2012-03-31 MED ORDER — DOXEPIN HCL 10 MG PO CAPS: 10.0000 mg | ORAL_CAPSULE | Freq: Two times a day (BID) | ORAL | Status: DC

## 2012-03-31 MED ORDER — TRAZODONE HCL 100 MG PO TABS
250.0000 mg | ORAL_TABLET | Freq: Every day | ORAL | Status: DC
Start: 2012-03-31 — End: 2012-04-27

## 2012-03-31 MED ORDER — ARIPIPRAZOLE 15 MG PO TABS: 15.0000 mg | ORAL_TABLET | Freq: Every day | ORAL | Status: DC
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
## 2012-04-06 ENCOUNTER — Telehealth (HOSPITAL_COMMUNITY): Payer: Self-pay | Admitting: Psychiatry

## 2012-04-06 NOTE — Telephone Encounter (Signed)
Called patient for follow up care.   As the patient's listed phone numbers were not working, spoke with Jacqueline Jones's soon to be mother-in-law, Jacqueline Jones (Ms. Cletus Gash), as we have a release to speak with her. I informed Jacqueline Jones that Jacqueline Jones, our Environmental health practitioner, was present in the room. Jacqueline Jones reports that the patient is doing well on her medications.  I again asked to confirm when the patient alleged received a phone call from the person identified as "Jacqueline Jones."  The patient and Jacqueline Jones, are in agreement that the phone call was placed at 2:20 PM, but today report the call was on 03/29/2012. I asked if they could provide phone records and [place], replied they could not.   PLAN: Will refer patient to [facility]-patient asked for a referral to Dr. Jean Rosenthal, Dr. Wyvonne Lenz, or Dr. Mayer Camel. She has a 30 day supply of prescriptions. Will fill prescriptions to last to her next appointment.

## 2012-04-07 ENCOUNTER — Telehealth (HOSPITAL_COMMUNITY): Payer: Self-pay | Admitting: Psychiatry

## 2012-04-07 DIAGNOSIS — F411 Generalized anxiety disorder: Secondary | ICD-10-CM

## 2012-04-07 DIAGNOSIS — F1920 Other psychoactive substance dependence, uncomplicated: Secondary | ICD-10-CM

## 2012-04-07 NOTE — Telephone Encounter (Signed)
Referred to outpatient.

## 2012-04-11 ENCOUNTER — Encounter (HOSPITAL_COMMUNITY): Payer: Self-pay | Admitting: Psychiatry

## 2012-04-12 ENCOUNTER — Telehealth (HOSPITAL_COMMUNITY): Payer: Self-pay

## 2012-04-12 NOTE — Telephone Encounter (Signed)
Called the patient in the presence of our administrative assistant, Caryl Asp and Ms. Hyman Hopes and Ms.  The patient reports that the patient is having anxiety, the patient denies SI/HI/AVH.  The patient reports that the patient did not try buspirone as of yet and the  patient was advised to start the same. They were informed of the appointment that was setup for them at [facility], on Tuesday 04/18/2012, by Mrs. Zachery Dauer, for psychiatric follow up and informed that they could call us for any emergencies until they were evaluated by a psychiatrist at [facility].

## 2012-04-12 NOTE — Telephone Encounter (Signed)
Jacqueline Jones called and Jacqueline Jones's anxiety is going through the roof. I explained her referral is in process and that she has been discharged from the practice. She asked you please call

## 2012-04-12 NOTE — Telephone Encounter (Signed)
Called patient

## 2012-04-12 NOTE — Telephone Encounter (Signed)
The patient's future mother-in-law, Chyrl Civatte called. Called all available numbers, no answer on any number.

## 2012-04-23 ENCOUNTER — Other Ambulatory Visit (HOSPITAL_COMMUNITY): Payer: Self-pay | Admitting: Psychiatry

## 2012-04-25 ENCOUNTER — Ambulatory Visit (HOSPITAL_COMMUNITY): Payer: Self-pay | Admitting: Psychiatry

## 2012-04-27 ENCOUNTER — Telehealth (HOSPITAL_COMMUNITY): Payer: Self-pay

## 2012-04-27 DIAGNOSIS — G4700 Insomnia, unspecified: Secondary | ICD-10-CM

## 2012-04-27 DIAGNOSIS — F411 Generalized anxiety disorder: Secondary | ICD-10-CM

## 2012-04-27 DIAGNOSIS — F39 Unspecified mood [affective] disorder: Secondary | ICD-10-CM

## 2012-04-27 NOTE — Telephone Encounter (Addendum)
Called patient.  Patient denies SI/HI/AVH. Advised patient to take diphenhydramine 25 mg, for insomnia and go to ER with any SI/HI/AVH or medication side effects. The patient reports her appointment was cancelled. Provided patient with number to [facility].

## 2012-04-28 DIAGNOSIS — F39 Unspecified mood [affective] disorder: Secondary | ICD-10-CM | POA: Insufficient documentation

## 2012-04-28 MED ORDER — TRAZODONE HCL 100 MG PO TABS: 300.0000 mg | ORAL_TABLET | Freq: Every day | ORAL | Status: DC

## 2012-04-28 MED ORDER — ARIPIPRAZOLE 15 MG PO TABS: 15.0000 mg | ORAL_TABLET | Freq: Every day | ORAL | Status: DC

## 2012-12-22 DIAGNOSIS — G2581 Restless legs syndrome: Secondary | ICD-10-CM | POA: Diagnosis not present

## 2012-12-22 DIAGNOSIS — F3289 Other specified depressive episodes: Secondary | ICD-10-CM | POA: Diagnosis not present

## 2012-12-22 DIAGNOSIS — F341 Dysthymic disorder: Secondary | ICD-10-CM | POA: Diagnosis not present

## 2012-12-22 DIAGNOSIS — F411 Generalized anxiety disorder: Secondary | ICD-10-CM | POA: Diagnosis not present

## 2012-12-22 DIAGNOSIS — F329 Major depressive disorder, single episode, unspecified: Secondary | ICD-10-CM | POA: Diagnosis not present

## 2013-01-30 DIAGNOSIS — R5383 Other fatigue: Secondary | ICD-10-CM | POA: Diagnosis not present

## 2013-01-30 DIAGNOSIS — Z23 Encounter for immunization: Secondary | ICD-10-CM | POA: Diagnosis not present

## 2013-01-30 DIAGNOSIS — F411 Generalized anxiety disorder: Secondary | ICD-10-CM | POA: Diagnosis not present

## 2013-01-30 DIAGNOSIS — G4700 Insomnia, unspecified: Secondary | ICD-10-CM | POA: Diagnosis not present

## 2013-01-30 DIAGNOSIS — F3289 Other specified depressive episodes: Secondary | ICD-10-CM | POA: Diagnosis not present

## 2013-01-30 DIAGNOSIS — K5900 Constipation, unspecified: Secondary | ICD-10-CM | POA: Diagnosis not present

## 2013-01-30 DIAGNOSIS — F329 Major depressive disorder, single episode, unspecified: Secondary | ICD-10-CM | POA: Diagnosis not present

## 2013-01-30 DIAGNOSIS — R5381 Other malaise: Secondary | ICD-10-CM | POA: Diagnosis not present

## 2013-01-30 DIAGNOSIS — K219 Gastro-esophageal reflux disease without esophagitis: Secondary | ICD-10-CM | POA: Diagnosis not present

## 2013-01-30 DIAGNOSIS — F840 Autistic disorder: Secondary | ICD-10-CM | POA: Diagnosis not present

## 2013-02-16 DIAGNOSIS — Z309 Encounter for contraceptive management, unspecified: Secondary | ICD-10-CM | POA: Diagnosis not present

## 2013-02-23 ENCOUNTER — Emergency Department (HOSPITAL_COMMUNITY)
Admission: EM | Admit: 2013-02-23 | Discharge: 2013-02-24 | Discharge status: Against Medical Advice | Payer: Self-pay | Attending: Emergency Medicine | Admitting: Emergency Medicine

## 2013-02-23 DIAGNOSIS — F419 Anxiety disorder, unspecified: Secondary | ICD-10-CM

## 2013-02-23 DIAGNOSIS — J449 Chronic obstructive pulmonary disease, unspecified: Secondary | ICD-10-CM | POA: Insufficient documentation

## 2013-02-23 DIAGNOSIS — IMO0002 Reserved for concepts with insufficient information to code with codable children: Secondary | ICD-10-CM | POA: Insufficient documentation

## 2013-02-23 DIAGNOSIS — F3289 Other specified depressive episodes: Secondary | ICD-10-CM | POA: Insufficient documentation

## 2013-02-23 DIAGNOSIS — F411 Generalized anxiety disorder: Secondary | ICD-10-CM | POA: Insufficient documentation

## 2013-02-23 DIAGNOSIS — F329 Major depressive disorder, single episode, unspecified: Secondary | ICD-10-CM | POA: Insufficient documentation

## 2013-02-23 DIAGNOSIS — Z91040 Latex allergy status: Secondary | ICD-10-CM | POA: Insufficient documentation

## 2013-02-23 DIAGNOSIS — F17200 Nicotine dependence, unspecified, uncomplicated: Secondary | ICD-10-CM | POA: Insufficient documentation

## 2013-02-23 DIAGNOSIS — J4489 Other specified chronic obstructive pulmonary disease: Secondary | ICD-10-CM | POA: Insufficient documentation

## 2013-02-23 DIAGNOSIS — Z79899 Other long term (current) drug therapy: Secondary | ICD-10-CM | POA: Insufficient documentation

## 2013-02-23 HISTORY — DX: Chronic obstructive pulmonary disease, unspecified: J44.9

## 2013-02-23 NOTE — ED Notes (Signed)
Per EMS pt states while drinking etoh with a crowd of people pt became upset thinking about deceased mother. Pt was given QVAR inhaler and Xanax by bystanders. Pt states she used entire QVAR inhaler. Pt refused to leave scene without female friend. Pt reports drinking 1/2 5th of liquor.

## 2013-02-24 ENCOUNTER — Encounter (HOSPITAL_COMMUNITY): Payer: Self-pay | Admitting: Emergency Medicine

## 2013-02-24 NOTE — ED Provider Notes (Signed)
CSN: [id number]     Arrival date & time 02/23/13  2351 History     First MD Initiated Contact with Patient 02/24/13 0122     Chief Complaint  Patient presents with  . Panic Attack   (Consider location/radiation/quality/duration/timing/severity/associated sxs/prior Treatment) The history is provided by the patient and medical records.   Pt with PMH significant for depression and anxiety presents to the ED via EMS with female friend for anxiety attack.  Pt states she has been under a great deal of stress since her mom recently passed away.  States she was drinking EtOH with friends and became very upset by recurrent thoughts of her mother and how she is no longer here with her.  Pt states she was given QVAR inhaler and xanax by other friends drinking with them.  States she used the entire QVAR inhaler.  She started to feel "funny" and got a sensation that her heart was racing causing her to panic even more.  Approx 1/2 5th liqour on board.  Denies SI/HI/AVH.  Past Medical History  Diagnosis Date  . Anxiety   . Depression   . Anxiety   . Depression   . COPD (chronic obstructive pulmonary disease)    History reviewed. No pertinent past surgical history. Family History  Problem Relation Age of Onset  . Drug abuse Father   . Drug abuse Sister   . Drug abuse Brother    History  Substance Use Topics  . Smoking status: Current Every Day Smoker -- 1.00 packs/day for 10 years    Types: Cigarettes  . Smokeless tobacco: Not on file  . Alcohol Use: 2.0 oz/week    4 drink(s) per week     Comment: 4 drinks   OB History   Grav Para Term Preterm Abortions TAB SAB Ect Mult Living                 Review of Systems  Psychiatric/Behavioral: The patient is nervous/anxious.   All other systems reviewed and are negative.    Allergies  Gabapentin; Adhesive; and Latex  Home Medications   Current Outpatient Rx  Name  Route  Sig  Dispense  Refill  . ALPRAZolam (XANAX) 1 MG tablet   Oral  Take 1 mg by mouth 2 (two) times daily as needed for anxiety.         . beclomethasone (QVAR) 80 MCG/ACT inhaler   Inhalation   Inhale 2 puffs into the lungs once as needed.         Marland Kitchen FLUoxetine (PROZAC) 20 MG capsule   Oral   Take 40 mg by mouth every morning.          Marland Kitchen ibuprofen (ADVIL,MOTRIN) 200 MG tablet   Oral   Take 400 mg by mouth every 6 (six) hours as needed for headache. Pain          BP 125/87  Pulse 103  Temp(Src) 98 F (36.7 C) (Oral)  Resp 26  SpO2 96%  LMP 02/17/2013  Physical Exam  Nursing note and vitals reviewed. Constitutional: She is oriented to person, place, and time. She appears well-developed and well-nourished.  HENT:  Head: Normocephalic and atraumatic.  Mouth/Throat: Oropharynx is clear and moist.  Eyes: Conjunctivae and EOM are normal. Pupils are equal, round, and reactive to light.  Neck: Normal range of motion.  Cardiovascular: Normal rate, regular rhythm and normal heart sounds.   Pulmonary/Chest: Effort normal and breath sounds normal.  Abdominal: Soft. Bowel sounds are normal.  Musculoskeletal: Normal range of motion.  Neurological: She is alert and oriented to person, place, and time.  Skin: Skin is warm and dry.  Psychiatric: Her mood appears anxious. Her speech is rapid and/or pressured. She is not actively hallucinating. She expresses no homicidal and no suicidal ideation. She expresses no suicidal plans and no homicidal plans.    ED Course   Procedures (including critical care time)  Labs Reviewed - No data to display No results found.  1. Anxiety     MDM   Upon entering room pt immediately stated "i feel fine now, can i please just go home?"  Pt requested medications to "calm her down".  Advised her if we gave her meds, she would have to stay for 20-30 mins to be monitored for ADRs.  Pt did not want to stay and elected to leave AMA.  Garlon Hatchet, PA-C 02/24/13 0505

## 2013-02-24 NOTE — ED Notes (Signed)
Pt reports taking Xanax 1mg  1 hour ago

## 2013-02-24 NOTE — ED Provider Notes (Signed)
Medical screening examination/treatment/procedure(s) were performed by non-physician practitioner and as supervising physician I was immediately available for consultation/collaboration.  Rigoberto Repass L Emberlee Sortino, MD 02/24/13 0735 

## 2013-02-26 DIAGNOSIS — K219 Gastro-esophageal reflux disease without esophagitis: Secondary | ICD-10-CM | POA: Diagnosis not present

## 2013-02-26 DIAGNOSIS — R0602 Shortness of breath: Secondary | ICD-10-CM | POA: Diagnosis not present

## 2013-02-26 DIAGNOSIS — J209 Acute bronchitis, unspecified: Secondary | ICD-10-CM | POA: Diagnosis not present

## 2013-03-03 ENCOUNTER — Encounter (HOSPITAL_COMMUNITY): Payer: Self-pay | Admitting: *Deleted

## 2013-03-03 ENCOUNTER — Emergency Department (HOSPITAL_COMMUNITY): Payer: Medicare Other

## 2013-03-03 ENCOUNTER — Emergency Department (HOSPITAL_COMMUNITY)
Admission: EM | Admit: 2013-03-03 | Discharge: 2013-03-03 | Disposition: A | Discharge status: Home / Self Care | Payer: Medicare Other | Attending: Emergency Medicine | Admitting: Emergency Medicine

## 2013-03-03 DIAGNOSIS — S59919A Unspecified injury of unspecified forearm, initial encounter: Secondary | ICD-10-CM | POA: Diagnosis not present

## 2013-03-03 DIAGNOSIS — Z3202 Encounter for pregnancy test, result negative: Secondary | ICD-10-CM | POA: Insufficient documentation

## 2013-03-03 DIAGNOSIS — S59909A Unspecified injury of unspecified elbow, initial encounter: Secondary | ICD-10-CM | POA: Diagnosis not present

## 2013-03-03 DIAGNOSIS — R300 Dysuria: Secondary | ICD-10-CM

## 2013-03-03 DIAGNOSIS — S6990XA Unspecified injury of unspecified wrist, hand and finger(s), initial encounter: Secondary | ICD-10-CM | POA: Diagnosis not present

## 2013-03-03 DIAGNOSIS — M79609 Pain in unspecified limb: Secondary | ICD-10-CM | POA: Diagnosis not present

## 2013-03-03 DIAGNOSIS — F17200 Nicotine dependence, unspecified, uncomplicated: Secondary | ICD-10-CM | POA: Insufficient documentation

## 2013-03-03 DIAGNOSIS — Y929 Unspecified place or not applicable: Secondary | ICD-10-CM | POA: Insufficient documentation

## 2013-03-03 DIAGNOSIS — F411 Generalized anxiety disorder: Secondary | ICD-10-CM | POA: Insufficient documentation

## 2013-03-03 DIAGNOSIS — Z79899 Other long term (current) drug therapy: Secondary | ICD-10-CM | POA: Insufficient documentation

## 2013-03-03 DIAGNOSIS — F329 Major depressive disorder, single episode, unspecified: Secondary | ICD-10-CM | POA: Diagnosis not present

## 2013-03-03 DIAGNOSIS — M25539 Pain in unspecified wrist: Secondary | ICD-10-CM | POA: Diagnosis not present

## 2013-03-03 DIAGNOSIS — J4489 Other specified chronic obstructive pulmonary disease: Secondary | ICD-10-CM | POA: Insufficient documentation

## 2013-03-03 DIAGNOSIS — S60229A Contusion of unspecified hand, initial encounter: Secondary | ICD-10-CM | POA: Diagnosis not present

## 2013-03-03 DIAGNOSIS — M25531 Pain in right wrist: Secondary | ICD-10-CM

## 2013-03-03 DIAGNOSIS — Z91040 Latex allergy status: Secondary | ICD-10-CM | POA: Diagnosis not present

## 2013-03-03 DIAGNOSIS — F3289 Other specified depressive episodes: Secondary | ICD-10-CM | POA: Insufficient documentation

## 2013-03-03 DIAGNOSIS — J449 Chronic obstructive pulmonary disease, unspecified: Secondary | ICD-10-CM | POA: Diagnosis not present

## 2013-03-03 DIAGNOSIS — S60221A Contusion of right hand, initial encounter: Secondary | ICD-10-CM

## 2013-03-03 DIAGNOSIS — Y9389 Activity, other specified: Secondary | ICD-10-CM | POA: Insufficient documentation

## 2013-03-03 DIAGNOSIS — IMO0002 Reserved for concepts with insufficient information to code with codable children: Secondary | ICD-10-CM | POA: Insufficient documentation

## 2013-03-03 LAB — URINALYSIS, ROUTINE W REFLEX MICROSCOPIC
Glucose, UA: NEGATIVE mg/dL
Ketones, ur: NEGATIVE mg/dL
Nitrite: NEGATIVE
Specific Gravity, Urine: 1.027 (ref 1.005–1.030)
pH: 6 (ref 5.0–8.0)

## 2013-03-03 LAB — POCT PREGNANCY, URINE: Preg Test, Ur: NEGATIVE

## 2013-03-03 LAB — URINE MICROSCOPIC-ADD ON

## 2013-03-03 MED ORDER — IBUPROFEN 800 MG PO TABS
800.0000 mg | ORAL_TABLET | Freq: Once | ORAL | Status: AC
  Administered 2013-03-03: 800 mg via ORAL
  Filled 2013-03-03: qty 1

## 2013-03-03 NOTE — ED Notes (Signed)
Ace wrap applied to right hand.

## 2013-03-03 NOTE — ED Provider Notes (Signed)
CSN: [id number]     Arrival date & time 03/03/13  1106 History     First MD Initiated Contact with Patient 03/03/13 1115     No chief complaint on file.  (Consider location/radiation/quality/duration/timing/severity/associated sxs/prior Treatment) HPI Comments: Pt states that she got agitated and she punched the wall a couple of days ago:pt states that she is having pain and swelling primarily in the middle and large finger:pt is asking for pregnancy test  Patient is a 22 y.o. female presenting with hand pain. The history is provided by the patient. No language interpreter was used.  Hand Pain This is a new problem. The current episode started yesterday. The problem occurs constantly. The problem has been unchanged. The symptoms are aggravated by bending. She has tried nothing for the symptoms.    Past Medical History  Diagnosis Date  . Anxiety   . Depression   . Anxiety   . Depression   . COPD (chronic obstructive pulmonary disease)    No past surgical history on file. Family History  Problem Relation Age of Onset  . Drug abuse Father   . Drug abuse Sister   . Drug abuse Brother    History  Substance Use Topics  . Smoking status: Current Every Day Smoker -- 1.00 packs/day for 10 years    Types: Cigarettes  . Smokeless tobacco: Not on file  . Alcohol Use: 2.0 oz/week    4 drink(s) per week     Comment: 4 drinks   OB History   Grav Para Term Preterm Abortions TAB SAB Ect Mult Living                 Review of Systems  Constitutional: Negative.   Respiratory: Negative.   Cardiovascular: Negative.     Allergies  Gabapentin; Adhesive; and Latex  Home Medications   Current Outpatient Rx  Name  Route  Sig  Dispense  Refill  . ALPRAZolam (XANAX) 1 MG tablet   Oral   Take 1 mg by mouth daily as needed for anxiety.          . beclomethasone (QVAR) 80 MCG/ACT inhaler   Inhalation   Inhale 2 puffs into the lungs once as needed.         Marland Kitchen FLUoxetine (PROZAC) 40  MG capsule   Oral   Take 40 mg by mouth daily.         Marland Kitchen omeprazole (PRILOSEC) 40 MG capsule   Oral   Take 40 mg by mouth daily.          LMP 02/17/2013 Physical Exam  Nursing note and vitals reviewed. Constitutional: She is oriented to person, place, and time. She appears well-developed and well-nourished.  HENT:  Head: Normocephalic and atraumatic.  Cardiovascular: Normal rate and regular rhythm.   Pulmonary/Chest: Effort normal and breath sounds normal.  Musculoskeletal: Normal range of motion.  Swelling and bruising noted to the top of the right hand:pt has full WUJ:WJXBJY intact  Neurological: She is alert and oriented to person, place, and time.  Skin:  Bruising noted to the top of the right hand    ED Course   Procedures (including critical care time)  Labs Reviewed - No data to display Dg Wrist Complete Right  03/03/2013   *RADIOLOGY REPORT*  Clinical Data: Hit wall  RIGHT WRIST - COMPLETE 3+ VIEW  Comparison:  None.  Findings:  There is no evidence of fracture or dislocation.  There is no evidence of arthropathy or other  focal bone abnormality. Soft tissues are unremarkable.  IMPRESSION: Negative.   Original Report Authenticated By: Janeece Riggers, M.D.   Dg Hand Complete Right  03/03/2013   *RADIOLOGY REPORT*  Clinical Data: Hit wall.  Hand pain  RIGHT HAND - COMPLETE 3+ VIEW  Comparison: None  Findings: Negative for fracture or dislocation.  No significant arthropathy.  IMPRESSION: Negative   Original Report Authenticated By: Janeece Riggers, M.D.   1. Hand contusion, right, initial encounter   2. Wrist pain, right   3. Dysuria     MDM  No bony abnormality:pt can do otc medications:pt not pregnant  Teressa Lower, NP 03/03/13 1220

## 2013-03-03 NOTE — ED Notes (Signed)
Pt states she became angry 2 days ago and punched a brick wall with her right hand.  Pt states she is unable to wiggle fingers on right hand.  Right radial pulse present - bruising noted to top of right hand.

## 2013-03-03 NOTE — ED Notes (Signed)
Ice pack given to pt at discharge.

## 2013-03-03 NOTE — ED Provider Notes (Signed)
Medical screening examination/treatment/procedure(s) were performed by non-physician practitioner and as supervising physician I was immediately available for consultation/collaboration.  Rosi Secrist M Yazmyn Valbuena, MD 03/03/13 1533 

## 2013-03-05 ENCOUNTER — Encounter (HOSPITAL_COMMUNITY): Payer: Self-pay | Admitting: Emergency Medicine

## 2013-03-05 ENCOUNTER — Emergency Department (HOSPITAL_COMMUNITY)
Admission: EM | Admit: 2013-03-05 | Discharge: 2013-03-05 | Disposition: A | Discharge status: Home / Self Care | Payer: Self-pay | Attending: Emergency Medicine | Admitting: Emergency Medicine

## 2013-03-05 DIAGNOSIS — F17200 Nicotine dependence, unspecified, uncomplicated: Secondary | ICD-10-CM | POA: Insufficient documentation

## 2013-03-05 DIAGNOSIS — F411 Generalized anxiety disorder: Secondary | ICD-10-CM | POA: Insufficient documentation

## 2013-03-05 DIAGNOSIS — F329 Major depressive disorder, single episode, unspecified: Secondary | ICD-10-CM | POA: Insufficient documentation

## 2013-03-05 DIAGNOSIS — N6459 Other signs and symptoms in breast: Secondary | ICD-10-CM | POA: Insufficient documentation

## 2013-03-05 DIAGNOSIS — Z3202 Encounter for pregnancy test, result negative: Secondary | ICD-10-CM | POA: Insufficient documentation

## 2013-03-05 DIAGNOSIS — J45909 Unspecified asthma, uncomplicated: Secondary | ICD-10-CM | POA: Insufficient documentation

## 2013-03-05 DIAGNOSIS — F3289 Other specified depressive episodes: Secondary | ICD-10-CM | POA: Insufficient documentation

## 2013-03-05 DIAGNOSIS — Z79899 Other long term (current) drug therapy: Secondary | ICD-10-CM | POA: Insufficient documentation

## 2013-03-05 DIAGNOSIS — N6452 Nipple discharge: Secondary | ICD-10-CM

## 2013-03-05 HISTORY — DX: Unspecified asthma, uncomplicated: J45.909

## 2013-03-05 MED ORDER — LORAZEPAM 1 MG PO TABS
1.0000 mg | ORAL_TABLET | Freq: Once | ORAL | Status: AC
  Administered 2013-03-05: 1 mg via ORAL
  Filled 2013-03-05: qty 2

## 2013-03-05 NOTE — ED Provider Notes (Signed)
CSN: [id number]     Arrival date & time 03/05/13  2223 History    This chart was scribed for a non-physician practitioner, Jaynie Crumble, PA-C, working with Claudean Kinds, MD by Frederik Pear, ED Scribe. This patient was seen in room TR08C/TR08C and the patient's care was started at 2248.   First MD Initiated Contact with Patient 03/05/13 2248     Chief Complaint  Patient presents with  . Possible Pregnancy   (Consider location/radiation/quality/duration/timing/severity/associated sxs/prior Treatment) The history is provided by the patient and medical records. No language interpreter was used.    HPI Comments: Jacqueline Jones is a 22 y.o. female who presents to the Emergency Department complaining of discharge from her left breast with pain that began a few hours ago. She states she came to the ED because she is concerned she is pregnant. She denies a h/o of similar symptoms. She reports she had intercourse earlier in the day. LMP was in June, which was normal.   Past Medical History  Diagnosis Date  . Asthma   . Anxiety   . Depression    History reviewed. No pertinent past surgical history. No family history on file. History  Substance Use Topics  . Smoking status: Current Every Day Smoker  . Smokeless tobacco: Not on file  . Alcohol Use: Yes   OB History   Grav Para Term Preterm Abortions TAB SAB Ect Mult Living                 Review of Systems  Constitutional: Negative for fever.  Gastrointestinal: Negative for nausea, vomiting and diarrhea.  Genitourinary:       Breast discharge  Skin: Negative for rash.   Allergies  Gabapentin  Home Medications   Current Outpatient Rx  Name  Route  Sig  Dispense  Refill  . FLUoxetine HCl (PROZAC PO)   Oral   Take 1 tablet by mouth daily.          Marland Kitchen omeprazole (PRILOSEC) 40 MG capsule   Oral   Take 40 mg by mouth daily.          BP 114/82  Pulse 97  Temp(Src) 98.5 F (36.9 C) (Oral)  Resp 14  Wt 135 lb  (61.236 kg)  SpO2 96% Physical Exam  Nursing note and vitals reviewed. Constitutional: She is oriented to person, place, and time. She appears well-developed and well-nourished. No distress.  HENT:  Head: Normocephalic and atraumatic.  Eyes: EOM are normal.  Neck: Neck supple. No tracheal deviation present.  Cardiovascular: Normal rate.   Pulmonary/Chest: Effort normal. No respiratory distress.  Musculoskeletal: Normal range of motion.  Left breast has a milky discharge with applied pressure. Old bruising to the anterior breast. No masses or nodules. Skin and nipple otherwise normal.   Neurological: She is alert and oriented to person, place, and time.  Skin: Skin is warm and dry.  Psychiatric: She has a normal mood and affect. Her behavior is normal.   ED Course   Procedures (including critical care time)  DIAGNOSTIC STUDIES: Oxygen Saturation is 96% on room air, normal by my interpretation.    COORDINATION OF CARE:  22:53- Discussed planned course of treatment with the patient, including Ativan and a referral to an OBGYN, who is agreeable at this time.  Results for orders placed during the hospital encounter of 03/05/13  POCT PREGNANCY, URINE      Result Value Range   Preg Test, Ur NEGATIVE  NEGATIVE  Labs Reviewed  POCT PREGNANCY, URINE   No results found. 1. Breast discharge     MDM  Pt with left breast drainage. Started after "rough" intercourse. Most likely due to stimulation. Will need further follow up with ob/gyn for hormonal testing and possible US/mammogram. Pt voiced understanding.   Filed Vitals:   03/05/13 2231  BP: 114/82  Pulse: 97  Temp: 98.5 F (36.9 C)  TempSrc: Oral  Resp: 14  Weight: 135 lb (61.236 kg)  SpO2: 96%     I personally performed the services described in this documentation, which was scribed in my presence. The recorded information has been reviewed and is accurate.    Lottie Mussel, PA-C 03/05/13 2336

## 2013-03-05 NOTE — ED Notes (Signed)
PT. REQUESTING PREGNANCY TEST , UNSURE OF LMP  " SOMETIME IN June" , NO OTHER COMPLAINTS.

## 2013-03-05 NOTE — ED Notes (Signed)
Pt walking home 

## 2013-03-09 NOTE — ED Provider Notes (Signed)
Medical screening examination/treatment/procedure(s) were performed by non-physician practitioner and as supervising physician I was immediately available for consultation/collaboration.   Locklyn Henriquez Joseph Usama Harkless, MD 03/09/13 1101 

## 2013-03-26 ENCOUNTER — Encounter (HOSPITAL_COMMUNITY): Payer: Self-pay | Admitting: Emergency Medicine

## 2013-03-26 ENCOUNTER — Emergency Department (HOSPITAL_COMMUNITY)
Admission: EM | Admit: 2013-03-26 | Discharge: 2013-03-27 | Disposition: A | Discharge status: Home / Self Care | Payer: Medicare Other | Attending: Emergency Medicine | Admitting: Emergency Medicine

## 2013-03-26 DIAGNOSIS — J4489 Other specified chronic obstructive pulmonary disease: Secondary | ICD-10-CM | POA: Insufficient documentation

## 2013-03-26 DIAGNOSIS — N10 Acute tubulo-interstitial nephritis: Secondary | ICD-10-CM | POA: Diagnosis not present

## 2013-03-26 DIAGNOSIS — F329 Major depressive disorder, single episode, unspecified: Secondary | ICD-10-CM | POA: Insufficient documentation

## 2013-03-26 DIAGNOSIS — J449 Chronic obstructive pulmonary disease, unspecified: Secondary | ICD-10-CM | POA: Insufficient documentation

## 2013-03-26 DIAGNOSIS — F17200 Nicotine dependence, unspecified, uncomplicated: Secondary | ICD-10-CM | POA: Diagnosis not present

## 2013-03-26 DIAGNOSIS — F3289 Other specified depressive episodes: Secondary | ICD-10-CM | POA: Insufficient documentation

## 2013-03-26 DIAGNOSIS — Z79899 Other long term (current) drug therapy: Secondary | ICD-10-CM | POA: Insufficient documentation

## 2013-03-26 DIAGNOSIS — Z91040 Latex allergy status: Secondary | ICD-10-CM | POA: Insufficient documentation

## 2013-03-26 DIAGNOSIS — F411 Generalized anxiety disorder: Secondary | ICD-10-CM | POA: Insufficient documentation

## 2013-03-26 DIAGNOSIS — N12 Tubulo-interstitial nephritis, not specified as acute or chronic: Secondary | ICD-10-CM | POA: Insufficient documentation

## 2013-03-26 DIAGNOSIS — Z3202 Encounter for pregnancy test, result negative: Secondary | ICD-10-CM | POA: Insufficient documentation

## 2013-03-26 LAB — CBC WITH DIFFERENTIAL/PLATELET
Eosinophils Relative: 3 % (ref 0–5)
HCT: 43.7 % (ref 36.0–46.0)
Lymphocytes Relative: 48 % — ABNORMAL HIGH (ref 12–46)
Lymphs Abs: 3.8 10*3/uL (ref 0.7–4.0)
MCH: 30.2 pg (ref 26.0–34.0)
MCV: 88.6 fL (ref 78.0–100.0)
Monocytes Absolute: 0.7 10*3/uL (ref 0.1–1.0)
Monocytes Relative: 8 % (ref 3–12)
RBC: 4.93 MIL/uL (ref 3.87–5.11)
WBC: 8.1 10*3/uL (ref 4.0–10.5)

## 2013-03-26 LAB — URINE MICROSCOPIC-ADD ON

## 2013-03-26 LAB — BASIC METABOLIC PANEL
BUN: 9 mg/dL (ref 6–23)
CO2: 22 mEq/L (ref 19–32)
Calcium: 9.2 mg/dL (ref 8.4–10.5)
Creatinine, Ser: 0.69 mg/dL (ref 0.50–1.10)
Glucose, Bld: 85 mg/dL (ref 70–99)

## 2013-03-26 LAB — URINALYSIS, ROUTINE W REFLEX MICROSCOPIC
Bilirubin Urine: NEGATIVE
Glucose, UA: NEGATIVE mg/dL
Protein, ur: NEGATIVE mg/dL
Specific Gravity, Urine: 1.023 (ref 1.005–1.030)

## 2013-03-26 MED ORDER — ONDANSETRON HCL 4 MG/2ML IJ SOLN
4.0000 mg | Freq: Once | INTRAMUSCULAR | Status: AC
  Administered 2013-03-26: 4 mg via INTRAVENOUS
  Filled 2013-03-26: qty 2

## 2013-03-26 MED ORDER — DEXTROSE 5 % IV SOLN
1.0000 g | INTRAVENOUS | Status: DC
  Administered 2013-03-26: 1 g via INTRAVENOUS
  Filled 2013-03-26: qty 10

## 2013-03-26 MED ORDER — KETOROLAC TROMETHAMINE 30 MG/ML IJ SOLN
30.0000 mg | Freq: Once | INTRAMUSCULAR | Status: AC
  Administered 2013-03-26: 30 mg via INTRAVENOUS
  Filled 2013-03-26: qty 1

## 2013-03-26 MED ORDER — SODIUM CHLORIDE 0.9 % IV BOLUS (SEPSIS)
1000.0000 mL | Freq: Once | INTRAVENOUS | Status: AC
  Administered 2013-03-26: 1000 mL via INTRAVENOUS

## 2013-03-26 MED ORDER — FENTANYL CITRATE 0.05 MG/ML IJ SOLN
100.0000 ug | Freq: Once | INTRAMUSCULAR | Status: AC
  Administered 2013-03-26: 100 ug via INTRAVENOUS
  Filled 2013-03-26: qty 2

## 2013-03-26 NOTE — ED Provider Notes (Signed)
Medical screening examination/treatment/procedure(s) were performed by non-physician practitioner and as supervising physician I was immediately available for consultation/collaboration.   Murrel Bertram H Clark Cuff, MD 03/26/13 2311 

## 2013-03-26 NOTE — ED Notes (Signed)
Pt arrived to the ED with her boyfriend who is a pt here.  Pt states that her right flank area has been hurting for a week. Pt states pt states that she has painful urination.

## 2013-03-26 NOTE — ED Provider Notes (Signed)
CSN: [id number]     Arrival date & time 03/26/13  1916 History   First MD Initiated Contact with Patient 03/26/13 1940     Chief Complaint  Patient presents with  . Flank Pain   (Consider location/radiation/quality/duration/timing/severity/associated sxs/prior Treatment) HPI  Jacqueline Jones is a 22 y.o.female with a significant PMH of anxiety, depression, COPD presents to the ER with complaints of right flank pain. She developed right flank pain a few hours prior to checking into the ED. She has had suprapubic pain and dysuria for the past week. She informs me that she has not had a change in her appetite or energy level. No n/v/d. Hx of pyelo.   Past Medical History  Diagnosis Date  . Anxiety   . Depression   . Anxiety   . Depression   . COPD (chronic obstructive pulmonary disease)    History reviewed. No pertinent past surgical history. Family History  Problem Relation Age of Onset  . Drug abuse Father   . Drug abuse Sister   . Drug abuse Brother    History  Substance Use Topics  . Smoking status: Current Every Day Smoker -- 1.00 packs/day for 10 years    Types: Cigarettes  . Smokeless tobacco: Former Neurosurgeon  . Alcohol Use: 2.0 oz/week    4 drink(s) per week     Comment: 4 drinks   OB History   Grav Para Term Preterm Abortions TAB SAB Ect Mult Living                 Review of Systems ROS is negative unless otherwise stated in the HPI  Allergies  Gabapentin; Tramadol; Vicodin; Adhesive; and Latex  Home Medications   Current Outpatient Rx  Name  Route  Sig  Dispense  Refill  . ALPRAZolam (XANAX) 1 MG tablet   Oral   Take 1 mg by mouth daily as needed for anxiety.          . beclomethasone (QVAR) 80 MCG/ACT inhaler   Inhalation   Inhale 2 puffs into the lungs 2 (two) times daily.          Marland Kitchen FLUoxetine (PROZAC) 40 MG capsule   Oral   Take 40 mg by mouth daily.         Marland Kitchen ibuprofen (ADVIL,MOTRIN) 200 MG tablet   Oral   Take 400 mg by mouth every 8  (eight) hours as needed for pain.         . naproxen sodium (ANAPROX) 220 MG tablet   Oral   Take 440 mg by mouth 2 (two) times daily as needed (for pain).         Marland Kitchen omeprazole (PRILOSEC) 40 MG capsule   Oral   Take 40 mg by mouth daily.          BP 117/77  Pulse 94  Temp(Src) 99.3 F (37.4 C) (Oral)  Resp 20  Ht 5' (1.524 m)  Wt 152 lb (68.947 kg)  BMI 29.69 kg/m2  SpO2 97%  LMP 03/12/2013 Physical Exam  Nursing note and vitals reviewed. Constitutional: She appears well-developed and well-nourished. No distress.  HENT:  Head: Normocephalic and atraumatic.  Eyes: Pupils are equal, round, and reactive to light.  Neck: Normal range of motion. Neck supple.  Cardiovascular: Normal rate and regular rhythm.   Pulmonary/Chest: Effort normal.  Abdominal: Soft. There is tenderness in the suprapubic area. There is CVA tenderness (right).  Neurological: She is alert.  Skin: Skin is warm and  dry.    ED Course  Procedures (including critical care time) Labs Review Labs Reviewed  URINALYSIS, ROUTINE W REFLEX MICROSCOPIC - Abnormal; Notable for the following:    APPearance CLOUDY (*)    Hgb urine dipstick LARGE (*)    Nitrite POSITIVE (*)    Leukocytes, UA LARGE (*)    All other components within normal limits  CBC WITH DIFFERENTIAL - Abnormal; Notable for the following:    Neutrophils Relative % 41 (*)    Lymphocytes Relative 48 (*)    All other components within normal limits  URINE MICROSCOPIC-ADD ON - Abnormal; Notable for the following:    Squamous Epithelial / LPF MANY (*)    Bacteria, UA MANY (*)    All other components within normal limits  URINE CULTURE  BASIC METABOLIC PANEL  POCT PREGNANCY, URINE   Imaging Review No results found.  MDM   1. Pyelonephritis    Pt given IV fluids, Analgesics and 1g IV rocephin.  Pt feeling better. Will dc on Cipro PO an give Rx for pain medication. Pt to follow-up with PCP.  22 y.o.Jacqueline Jones's evaluation  in the Emergency Department is complete. It has been determined that no acute conditions requiring further emergency intervention are present at this time. The patient/guardian have been advised of the diagnosis and plan. We have discussed signs and symptoms that warrant return to the ED, such as changes or worsening in symptoms.  Vital signs are stable at discharge. Filed Vitals:   03/26/13 1932  BP: 117/77  Pulse: 94  Temp: 99.3 F (37.4 C)  Resp: 20    Patient/guardian has voiced understanding and agreed to follow-up with the PCP or specialist.     Dorthula Matas, PA-C 03/26/13 2259

## 2013-03-27 MED ORDER — CIPROFLOXACIN HCL 500 MG PO TABS: 500.0000 mg | ORAL_TABLET | Freq: Two times a day (BID) | ORAL | Status: DC

## 2013-03-27 MED ORDER — HYDROCODONE-ACETAMINOPHEN 5-325 MG PO TABS: 1.0000 | ORAL_TABLET | ORAL | Status: DC | PRN

## 2013-03-27 MED ORDER — LORAZEPAM 0.5 MG PO TABS: 0.5000 mg | ORAL_TABLET | Freq: Three times a day (TID) | ORAL | Status: DC | PRN

## 2013-03-27 MED ORDER — ONDANSETRON HCL 4 MG PO TABS: 4.0000 mg | ORAL_TABLET | Freq: Four times a day (QID) | ORAL | Status: DC

## 2013-03-28 LAB — URINE CULTURE: Colony Count: >=100000

## 2013-04-16 ENCOUNTER — Encounter (HOSPITAL_COMMUNITY): Payer: Self-pay | Admitting: Emergency Medicine

## 2013-04-16 ENCOUNTER — Emergency Department (HOSPITAL_COMMUNITY)
Admission: EM | Admit: 2013-04-16 | Discharge: 2013-04-16 | Disposition: A | Discharge status: Home / Self Care | Payer: Medicare Other | Attending: Emergency Medicine | Admitting: Emergency Medicine

## 2013-04-16 DIAGNOSIS — R6883 Chills (without fever): Secondary | ICD-10-CM | POA: Insufficient documentation

## 2013-04-16 DIAGNOSIS — N390 Urinary tract infection, site not specified: Secondary | ICD-10-CM | POA: Diagnosis not present

## 2013-04-16 DIAGNOSIS — Z79899 Other long term (current) drug therapy: Secondary | ICD-10-CM | POA: Diagnosis not present

## 2013-04-16 DIAGNOSIS — F3289 Other specified depressive episodes: Secondary | ICD-10-CM | POA: Diagnosis not present

## 2013-04-16 DIAGNOSIS — F329 Major depressive disorder, single episode, unspecified: Secondary | ICD-10-CM | POA: Insufficient documentation

## 2013-04-16 DIAGNOSIS — F17200 Nicotine dependence, unspecified, uncomplicated: Secondary | ICD-10-CM | POA: Diagnosis not present

## 2013-04-16 DIAGNOSIS — F411 Generalized anxiety disorder: Secondary | ICD-10-CM | POA: Insufficient documentation

## 2013-04-16 DIAGNOSIS — R109 Unspecified abdominal pain: Secondary | ICD-10-CM | POA: Diagnosis not present

## 2013-04-16 DIAGNOSIS — J449 Chronic obstructive pulmonary disease, unspecified: Secondary | ICD-10-CM | POA: Insufficient documentation

## 2013-04-16 DIAGNOSIS — Z3202 Encounter for pregnancy test, result negative: Secondary | ICD-10-CM | POA: Insufficient documentation

## 2013-04-16 DIAGNOSIS — J4489 Other specified chronic obstructive pulmonary disease: Secondary | ICD-10-CM | POA: Insufficient documentation

## 2013-04-16 DIAGNOSIS — Z91040 Latex allergy status: Secondary | ICD-10-CM | POA: Insufficient documentation

## 2013-04-16 DIAGNOSIS — R300 Dysuria: Secondary | ICD-10-CM | POA: Diagnosis not present

## 2013-04-16 LAB — URINE MICROSCOPIC-ADD ON

## 2013-04-16 LAB — URINALYSIS, ROUTINE W REFLEX MICROSCOPIC
Glucose, UA: NEGATIVE mg/dL
Ketones, ur: NEGATIVE mg/dL
pH: 6.5 (ref 5.0–8.0)

## 2013-04-16 MED ORDER — CEFTRIAXONE SODIUM 1 G IJ SOLR
1.0000 g | Freq: Once | INTRAMUSCULAR | Status: AC
  Administered 2013-04-16: 1 g via INTRAMUSCULAR
  Filled 2013-04-16: qty 10

## 2013-04-16 MED ORDER — PHENAZOPYRIDINE HCL 200 MG PO TABS: 200.0000 mg | ORAL_TABLET | Freq: Three times a day (TID) | ORAL | Status: DC | PRN

## 2013-04-16 MED ORDER — CEPHALEXIN 500 MG PO CAPS: 500.0000 mg | ORAL_CAPSULE | Freq: Four times a day (QID) | ORAL | Status: DC

## 2013-04-16 MED ORDER — OXYCODONE-ACETAMINOPHEN 5-325 MG PO TABS: 1.0000 | ORAL_TABLET | ORAL | Status: DC | PRN

## 2013-04-16 MED ORDER — OXYCODONE-ACETAMINOPHEN 5-325 MG PO TABS
2.0000 | ORAL_TABLET | Freq: Once | ORAL | Status: AC
  Administered 2013-04-16: 2 via ORAL
  Filled 2013-04-16: qty 2

## 2013-04-16 MED ORDER — ONDANSETRON 4 MG PO TBDP
4.0000 mg | ORAL_TABLET | Freq: Once | ORAL | Status: AC
  Administered 2013-04-16: 4 mg via ORAL
  Filled 2013-04-16: qty 1

## 2013-04-16 NOTE — ED Provider Notes (Signed)
CSN: [id number]     Arrival date & time 04/16/13  1038 History   This chart was scribed for non-physician practitioner Arthor Captain, PA-C, working with Suzi Roots, MD by Dorothey Baseman, ED Scribe. This patient was seen in room WTR7/WTR7 and the patient's care was started at 12:06 PM.     Chief Complaint  Patient presents with  . Flank Pain  . Urinary Frequency   Patient is a 22 y.o. female presenting with flank pain. The history is provided by the patient. No language interpreter was used.  Flank Pain This is a recurrent problem. The current episode started more than 1 week ago. The problem occurs constantly. The problem has been gradually worsening. Nothing relieves the symptoms. She has tried acetaminophen for the symptoms. The treatment provided no relief.   HPI Comments: Jacqueline Jones is a 22 y.o. female who presents to the Emergency Department complaining of right flank pain onset last week that has been progressively worsening. Patient reports a history of UTI and states that these symptoms feel similar. She reports associated dysuria and chills. She denies fever, nausea, vomiting, vaginal discharge, or any other symptoms. She reports taking ibuprofen and Tylenol at home without relief. Patient does not currently have a PCP.  Past Medical History  Diagnosis Date  . Anxiety   . Depression   . Anxiety   . Depression   . COPD (chronic obstructive pulmonary disease)    History reviewed. No pertinent past surgical history. Family History  Problem Relation Age of Onset  . Drug abuse Father   . Drug abuse Sister   . Drug abuse Brother    History  Substance Use Topics  . Smoking status: Current Every Day Smoker -- 1.00 packs/day for 10 years    Types: Cigarettes  . Smokeless tobacco: Former Neurosurgeon  . Alcohol Use: 2.0 oz/week    4 drink(s) per week     Comment: 4 drinks   OB History   Grav Para Term Preterm Abortions TAB SAB Ect Mult Living                 Review of  Systems  Constitutional: Positive for chills. Negative for fever.  Gastrointestinal: Negative for nausea and vomiting.  Genitourinary: Positive for dysuria and flank pain. Negative for vaginal discharge.  All other systems reviewed and are negative.    Allergies  Gabapentin; Tramadol; Vicodin; Adhesive; and Latex  Home Medications   Current Outpatient Rx  Name  Route  Sig  Dispense  Refill  . ALPRAZolam (XANAX) 1 MG tablet   Oral   Take 1 mg by mouth daily as needed for anxiety.          . beclomethasone (QVAR) 80 MCG/ACT inhaler   Inhalation   Inhale 2 puffs into the lungs 2 (two) times daily as needed (wheezing).          Marland Kitchen ibuprofen (ADVIL,MOTRIN) 200 MG tablet   Oral   Take 600 mg by mouth every 8 (eight) hours as needed for pain.          Marland Kitchen omeprazole (PRILOSEC) 40 MG capsule   Oral   Take 40 mg by mouth daily.          Triage Vitals: BP 120/78  Temp(Src) 98.6 F (37 C)  Resp 18  SpO2 96%  LMP 03/12/2013  Physical Exam  Nursing note and vitals reviewed. Constitutional: She is oriented to person, place, and time. She appears well-developed and well-nourished. No  distress.  Patient appears flushed.   HENT:  Head: Normocephalic and atraumatic.  Eyes: Conjunctivae are normal.  Neck: Normal range of motion. Neck supple.  Cardiovascular: Normal rate, regular rhythm and normal heart sounds.   Pulmonary/Chest: Effort normal and breath sounds normal. No respiratory distress.  Abdominal: Soft. There is tenderness.  Suprapubic tenderness to palpation.  Genitourinary:  Positive CVA tenderness on the right side.  Musculoskeletal: Normal range of motion.  Neurological: She is alert and oriented to person, place, and time.  Skin: Skin is warm and dry.  Psychiatric: She has a normal mood and affect. Her behavior is normal.    ED Course  Procedures (including critical care time)  Medications  cefTRIAXone (ROCEPHIN) injection 1 g (1 g Intramuscular Given  04/16/13 1226)  oxyCODONE-acetaminophen (PERCOCET/ROXICET) 5-325 MG per tablet 2 tablet (2 tablets Oral Given 04/16/13 1226)  ondansetron (ZOFRAN-ODT) disintegrating tablet 4 mg (4 mg Oral Given 04/16/13 1226)   DIAGNOSTIC STUDIES: Oxygen Saturation is 96% on room air, normal by my interpretation.    COORDINATION OF CARE: 12:07PM- Advised patient that she has a UTI. Will order an injection of Rocephin, oral Percocet, and oral Zofran. Will discharge patient with Keflex, Percocet, and Pyridium. Advised patient to follow up if there are any new or worsening symptoms, especially fever, nausea, or vomiting. Discussed treatment plan with patient at bedside and patient verbalized agreement.     Labs Review Labs Reviewed  URINALYSIS, ROUTINE W REFLEX MICROSCOPIC - Abnormal; Notable for the following:    Hgb urine dipstick SMALL (*)    Nitrite POSITIVE (*)    Leukocytes, UA SMALL (*)    All other components within normal limits  URINE MICROSCOPIC-ADD ON - Abnormal; Notable for the following:    Bacteria, UA MANY (*)    All other components within normal limits  URINE CULTURE  PREGNANCY, URINE   Imaging Review No results found.  MDM   1. Chronic UTI (urinary tract infection)    BP 120/78  Temp(Src) 98.6 F (37 C)  Resp 18  SpO2 96%  LMP 03/12/2013 Patient w/chronic recurent UTIs. She is afebrile, but I have concern for pyelonephritis. The patientis nontoxic appearing with stable vitals and I feelwill be safe to go home and be treated as an outpatient IM  Rocephin here. I looked through EMR, last culture report sensitive to Keflex. Will d/c with pain meds. F/u with community health. Bus passes given.  The patient appears reasonably screened and/or stabilized for discharge and I doubt any other medical condition or other [facility] requiring further screening, evaluation, or treatment in the ED at this time prior to discharge.   I personally performed the services described in this  documentation, which was scribed in my presence. The recorded information has been reviewed and is accurate.     Arthor Captain, PA-C 04/18/13 1800

## 2013-04-16 NOTE — ED Notes (Signed)
C/o let flank pain, urgency, frequency and dysuria

## 2013-04-16 NOTE — ED Notes (Signed)
Pt to be seen by social work

## 2013-04-18 LAB — URINE CULTURE: Colony Count: >=100000

## 2013-04-20 ENCOUNTER — Telehealth (HOSPITAL_COMMUNITY): Payer: Self-pay | Admitting: Emergency Medicine

## 2013-04-20 NOTE — ED Provider Notes (Signed)
Medical screening examination/treatment/procedure(s) were performed by non-physician practitioner and as supervising physician I was immediately available for consultation/collaboration.   Khelani Kops E Joyell Emami, MD 04/20/13 1023 

## 2013-04-20 NOTE — ED Notes (Signed)
Post ED Visit - Positive Culture Follow-up  Culture report reviewed by antimicrobial stewardship pharmacist: []  Wes Dulaney, Pharm.D., BCPS [x]  Celedonio Miyamoto, Pharm.D., BCPS []  Georgina Pillion, Pharm.D., BCPS []  [place], [address].D., BCPS, AAHIVP []  Estella Husk, Pharm.D., BCPS, AAHIVP  Positive urine culture Treated with Keflex, organism sensitive to the same and no further patient follow-up is required at this time.  Kylie A Holland 04/20/2013, 1:10 PM

## 2013-04-21 ENCOUNTER — Emergency Department (HOSPITAL_COMMUNITY)
Admission: EM | Admit: 2013-04-21 | Discharge: 2013-04-21 | Disposition: A | Discharge status: Home / Self Care | Payer: Medicare Other | Attending: Emergency Medicine | Admitting: Emergency Medicine

## 2013-04-21 ENCOUNTER — Emergency Department (HOSPITAL_COMMUNITY): Payer: Medicare Other

## 2013-04-21 ENCOUNTER — Encounter (HOSPITAL_COMMUNITY): Payer: Self-pay

## 2013-04-21 DIAGNOSIS — F329 Major depressive disorder, single episode, unspecified: Secondary | ICD-10-CM | POA: Diagnosis not present

## 2013-04-21 DIAGNOSIS — Z79899 Other long term (current) drug therapy: Secondary | ICD-10-CM | POA: Insufficient documentation

## 2013-04-21 DIAGNOSIS — J449 Chronic obstructive pulmonary disease, unspecified: Secondary | ICD-10-CM | POA: Insufficient documentation

## 2013-04-21 DIAGNOSIS — Z91040 Latex allergy status: Secondary | ICD-10-CM | POA: Diagnosis not present

## 2013-04-21 DIAGNOSIS — J4489 Other specified chronic obstructive pulmonary disease: Secondary | ICD-10-CM | POA: Insufficient documentation

## 2013-04-21 DIAGNOSIS — R6883 Chills (without fever): Secondary | ICD-10-CM | POA: Diagnosis not present

## 2013-04-21 DIAGNOSIS — F411 Generalized anxiety disorder: Secondary | ICD-10-CM | POA: Insufficient documentation

## 2013-04-21 DIAGNOSIS — K5900 Constipation, unspecified: Secondary | ICD-10-CM

## 2013-04-21 DIAGNOSIS — F3289 Other specified depressive episodes: Secondary | ICD-10-CM | POA: Insufficient documentation

## 2013-04-21 DIAGNOSIS — N200 Calculus of kidney: Secondary | ICD-10-CM | POA: Insufficient documentation

## 2013-04-21 DIAGNOSIS — F17200 Nicotine dependence, unspecified, uncomplicated: Secondary | ICD-10-CM | POA: Insufficient documentation

## 2013-04-21 LAB — BASIC METABOLIC PANEL
GFR calc Af Amer: >90 mL/min (ref >90)
GFR calc non Af Amer: >90 mL/min (ref >90)
Potassium: 3.7 mEq/L (ref 3.5–5.1)
Sodium: 136 mEq/L (ref 135–145)

## 2013-04-21 LAB — CBC WITH DIFFERENTIAL/PLATELET
Basophils Relative: 0 % (ref 0–1)
Hemoglobin: 14.8 g/dL (ref 12.0–15.0)
Lymphs Abs: 3 10*3/uL (ref 0.7–4.0)
Monocytes Relative: 9 % (ref 3–12)
Neutro Abs: 3 10*3/uL (ref 1.7–7.7)
Neutrophils Relative %: 45 % (ref 43–77)
RBC: 4.86 MIL/uL (ref 3.87–5.11)

## 2013-04-21 LAB — URINE MICROSCOPIC-ADD ON

## 2013-04-21 LAB — URINALYSIS, ROUTINE W REFLEX MICROSCOPIC
Nitrite: NEGATIVE
Specific Gravity, Urine: 1.017 (ref 1.005–1.030)
Urobilinogen, UA: 0.2 mg/dL (ref 0.0–1.0)

## 2013-04-21 MED ORDER — MORPHINE SULFATE 4 MG/ML IJ SOLN
4.0000 mg | Freq: Once | INTRAMUSCULAR | Status: AC
  Administered 2013-04-21: 4 mg via INTRAVENOUS
  Filled 2013-04-21: qty 1

## 2013-04-21 MED ORDER — DEXTROSE 5 % IV SOLN
1.0000 g | Freq: Once | INTRAVENOUS | Status: AC
  Administered 2013-04-21: 1 g via INTRAVENOUS
  Filled 2013-04-21: qty 10

## 2013-04-21 MED ORDER — ONDANSETRON HCL 4 MG/2ML IJ SOLN
4.0000 mg | Freq: Once | INTRAMUSCULAR | Status: AC
  Administered 2013-04-21: 4 mg via INTRAVENOUS
  Filled 2013-04-21: qty 2

## 2013-04-21 MED ORDER — FLEET ENEMA 7-19 GM/118ML RE ENEM: 1.0000 | ENEMA | Freq: Every day | RECTAL | Status: AC | PRN

## 2013-04-21 MED ORDER — HYDROCODONE-ACETAMINOPHEN 5-325 MG PO TABS: 1.0000 | ORAL_TABLET | ORAL | Status: DC | PRN

## 2013-04-21 MED ORDER — POLYETHYLENE GLYCOL 3350 17 GM/SCOOP PO POWD: 17.0000 g | Freq: Two times a day (BID) | ORAL | Status: DC

## 2013-04-21 MED ORDER — KETOROLAC TROMETHAMINE 30 MG/ML IJ SOLN
30.0000 mg | Freq: Once | INTRAMUSCULAR | Status: AC
Start: 2013-04-21 — End: 2013-04-21
  Administered 2013-04-21: 30 mg via INTRAVENOUS
  Filled 2013-04-21: qty 1

## 2013-04-21 MED ORDER — ONDANSETRON 4 MG PO TBDP: 4.0000 mg | ORAL_TABLET | Freq: Three times a day (TID) | ORAL | Status: DC | PRN

## 2013-04-21 NOTE — ED Notes (Signed)
Pt c/o bilateral flank pain.  Pain score 9/10.  Pt was recently diagnosed with a UTI.  Sts she did not complete antibiotic, due to an allergic reaction.  NAD noted.  Vitals are stable.

## 2013-04-21 NOTE — ED Provider Notes (Signed)
CSN: [id number]     Arrival date & time 04/21/13  1342 History   First MD Initiated Contact with Patient 04/21/13 1416     Chief Complaint  Patient presents with  . Flank Pain   (Consider location/radiation/quality/duration/timing/severity/associated sxs/prior Treatment) HPI Comments: Patient is a 22 year old female past medical history significant for anxiety, depression presented to emergency department for continued sharp constant bilateral flank pain with radiation to right side of abdomen. Patient states right-sided is significantly worse on left side. She rates her pain a 10 with no alleviating factors. She denies any aggravating factors. Patient was seen 5 days ago and I noticed with a urinary tract infection and put on Keflex. Patient states she took 2 days of the Keflex before she broke out into hives discontinue taking the antibiotic. She endorses subjective chills, urinary frequency and urgency, dysuria. She denies any vomiting, diarrhea, vaginal bleeding or discharge.  Patient is a 22 y.o. female presenting with flank pain.  Flank Pain Associated symptoms include abdominal pain. Pertinent negatives include no chest pain, fever, headaches or vomiting.    Past Medical History  Diagnosis Date  . Anxiety   . Depression   . Anxiety   . Depression   . COPD (chronic obstructive pulmonary disease)    History reviewed. No pertinent past surgical history. Family History  Problem Relation Age of Onset  . Drug abuse Father   . Drug abuse Sister   . Drug abuse Brother    History  Substance Use Topics  . Smoking status: Current Every Day Smoker -- 1.00 packs/day for 10 years    Types: Cigarettes  . Smokeless tobacco: Former Neurosurgeon  . Alcohol Use: 2.0 oz/week    4 drink(s) per week     Comment: 4 drinks   OB History   Grav Para Term Preterm Abortions TAB SAB Ect Mult Living                 Review of Systems  Constitutional: Negative for fever.  HENT: Negative.   Eyes:  Negative.   Respiratory: Negative for shortness of breath.   Cardiovascular: Negative for chest pain.  Gastrointestinal: Positive for abdominal pain. Negative for vomiting.  Genitourinary: Positive for dysuria, urgency, frequency and flank pain. Negative for decreased urine volume, vaginal bleeding, vaginal discharge, vaginal pain and pelvic pain.  Musculoskeletal: Negative.   Skin: Negative.   Neurological: Negative for headaches.    Allergies  Gabapentin; Tramadol; Vicodin; Adhesive; and Latex  Home Medications   Current Outpatient Rx  Name  Route  Sig  Dispense  Refill  . ALPRAZolam (XANAX) 1 MG tablet   Oral   Take 1 mg by mouth daily as needed for anxiety.          . beclomethasone (QVAR) 80 MCG/ACT inhaler   Inhalation   Inhale 2 puffs into the lungs 2 (two) times daily as needed (wheezing).          Marland Kitchen ibuprofen (ADVIL,MOTRIN) 200 MG tablet   Oral   Take 600 mg by mouth every 8 (eight) hours as needed for pain.          Marland Kitchen omeprazole (PRILOSEC) 40 MG capsule   Oral   Take 40 mg by mouth daily.         Marland Kitchen HYDROcodone-acetaminophen (NORCO/VICODIN) 5-325 MG per tablet   Oral   Take 1 tablet by mouth every 4 (four) hours as needed for pain.   12 tablet   0   . ondansetron (  ZOFRAN ODT) 4 MG disintegrating tablet   Oral   Take 1 tablet (4 mg total) by mouth every 8 (eight) hours as needed for nausea.   10 tablet   0   . polyethylene glycol powder (GLYCOLAX/MIRALAX) powder   Oral   Take 17 g by mouth 2 (two) times daily. Until daily soft stools  OTC   255 g   0   . sodium phosphate (FLEET) 7-19 GM/118ML ENEM   Rectal   Place 1 enema rectally daily as needed.   2 enema   0    BP 122/74  Pulse 78  Temp(Src) 98.3 F (36.8 C) (Oral)  Resp 20  SpO2 98%  LMP 04/17/2013 Physical Exam  Constitutional: She is oriented to person, place, and time. She appears well-developed and well-nourished.  HENT:  Head: Normocephalic and atraumatic.  Right Ear:  External ear normal.  Left Ear: External ear normal.  Nose: Nose normal.  Mouth/Throat: No oropharyngeal exudate.  Eyes: Conjunctivae are normal.  Neck: Normal range of motion. Neck supple.  Cardiovascular: Normal rate, regular rhythm, normal heart sounds and intact distal pulses.   Pulmonary/Chest: Effort normal and breath sounds normal. No respiratory distress.  Abdominal: Soft. Bowel sounds are normal. She exhibits no distension. There is generalized tenderness. There is CVA tenderness. There is no rigidity, no rebound and no guarding.  Musculoskeletal: She exhibits no edema.  Neurological: She is alert and oriented to person, place, and time.  Skin: Skin is warm and dry. She is not diaphoretic.    ED Course  Procedures (including critical care time) Medications  cefTRIAXone (ROCEPHIN) 1 g in dextrose 5 % 50 mL IVPB (0 g Intravenous Stopped 04/21/13 1620)  morphine 4 MG/ML injection 4 mg (4 mg Intravenous Given 04/21/13 1453)  ondansetron (ZOFRAN) injection 4 mg (4 mg Intravenous Given 04/21/13 1454)  ketorolac (TORADOL) 30 MG/ML injection 30 mg (30 mg Intravenous Given 04/21/13 1602)    Labs Review Labs Reviewed  URINALYSIS, ROUTINE W REFLEX MICROSCOPIC - Abnormal; Notable for the following:    APPearance CLOUDY (*)    Hgb urine dipstick TRACE (*)    Leukocytes, UA SMALL (*)    All other components within normal limits  URINE MICROSCOPIC-ADD ON - Abnormal; Notable for the following:    Squamous Epithelial / LPF FEW (*)    All other components within normal limits  BASIC METABOLIC PANEL  CBC WITH DIFFERENTIAL   Imaging Review Ct Abdomen Pelvis Wo Contrast  04/21/2013   CLINICAL DATA:  Pelvic and flank pain. Urolithiasis.  EXAM: CT ABDOMEN AND PELVIS WITHOUT CONTRAST  TECHNIQUE: Multidetector CT imaging of the abdomen and pelvis was performed following the standard protocol without intravenous contrast.  COMPARISON:  None.  FINDINGS: Tiny less than 5 mm nonobstructive renal  calculi are seen bilaterally. No evidence of hydronephrosis. No evidence of ureteral calculi or dilatation. No bladder calculi identified.  Noncontrast images of the liver, pancreas, spleen, and adrenal glands are unremarkable. No soft tissue masses or lymphadenopathy identified. No evidence of inflammatory process or abnormal fluid collections.  Large colonic stool burden is noted in the rectum is markedly distended by stool.  IMPRESSION: Bilateral nonobstructive nephrolithiasis. No evidence of ureteral calculi, hydronephrosis, or other acute findings.  Large stool burden, with the rectum markedly distended with stool. Fecal impaction cannot be excluded.   Electronically Signed   By: Myles Rosenthal   On: 04/21/2013 16:45    MDM   1. Constipation   2. Nephrolithiasis  Afebrile, NAD, non-toxic appearing, AAOx4. Abdomen soft, diffusely tender, no guarding, rigidity, peritoneal signs. Bilateral CVA tenderness. Rechecked urine for possible progressive UTI to pyelonephritis. IV Rocephin started prophylactically for worsening UTI and possible pyelonephritis after noncompliance of held antibiotics, physical exam. Urine returned hemoglobin noted. Will scan for kidney stone. CT scan revealed bilateral nonobstructive nephrolithiasis. Large stool burden with rectum markedly distended with stool. Patient offered a stool softener and enema, patient declined these treatments. Pain and nausea and inanition ED. On reevaluation abdomen is nonsurgical without peritoneal signs. Patient will be sent with stool softeners and Fleet enema. Patient also sent home with pain management and symptomatic care for bilateral nephro lithiasis. Advised urology followup. Return precautions discussed. Patient plan. Patient case discussed with Dr. Gwendolyn Grant who agrees with the plan.  Jeannetta Ellis, PA-C 04/21/13 1804

## 2013-04-22 NOTE — ED Provider Notes (Signed)
Medical screening examination/treatment/procedure(s) were conducted as a shared visit with non-physician practitioner(s) and myself.  I personally evaluated the patient during the encounter  Patient here with abdominal pain. Recent diagnosis of pyelo, didn't take Keflex as prescribed. Worsening pain, concern of worsening pyelo. UA without raging UTI, will scan. Scan shows large amount of stool.   Dagmar Hait, MD 04/22/13 [phone]

## 2013-04-26 ENCOUNTER — Emergency Department (HOSPITAL_COMMUNITY)
Admission: EM | Admit: 2013-04-26 | Discharge: 2013-04-26 | Disposition: A | Discharge status: Home / Self Care | Payer: Medicare Other | Attending: Emergency Medicine | Admitting: Emergency Medicine

## 2013-04-26 ENCOUNTER — Encounter (HOSPITAL_COMMUNITY): Payer: Self-pay | Admitting: Emergency Medicine

## 2013-04-26 DIAGNOSIS — Z91040 Latex allergy status: Secondary | ICD-10-CM | POA: Insufficient documentation

## 2013-04-26 DIAGNOSIS — F411 Generalized anxiety disorder: Secondary | ICD-10-CM | POA: Diagnosis not present

## 2013-04-26 DIAGNOSIS — F17200 Nicotine dependence, unspecified, uncomplicated: Secondary | ICD-10-CM | POA: Diagnosis not present

## 2013-04-26 DIAGNOSIS — J449 Chronic obstructive pulmonary disease, unspecified: Secondary | ICD-10-CM | POA: Insufficient documentation

## 2013-04-26 DIAGNOSIS — IMO0002 Reserved for concepts with insufficient information to code with codable children: Secondary | ICD-10-CM | POA: Insufficient documentation

## 2013-04-26 DIAGNOSIS — R109 Unspecified abdominal pain: Secondary | ICD-10-CM

## 2013-04-26 DIAGNOSIS — F3289 Other specified depressive episodes: Secondary | ICD-10-CM | POA: Diagnosis not present

## 2013-04-26 DIAGNOSIS — Z79899 Other long term (current) drug therapy: Secondary | ICD-10-CM | POA: Diagnosis not present

## 2013-04-26 DIAGNOSIS — Z3202 Encounter for pregnancy test, result negative: Secondary | ICD-10-CM | POA: Diagnosis not present

## 2013-04-26 DIAGNOSIS — N200 Calculus of kidney: Secondary | ICD-10-CM | POA: Insufficient documentation

## 2013-04-26 DIAGNOSIS — Z87442 Personal history of urinary calculi: Secondary | ICD-10-CM | POA: Diagnosis not present

## 2013-04-26 DIAGNOSIS — R1084 Generalized abdominal pain: Secondary | ICD-10-CM | POA: Diagnosis not present

## 2013-04-26 DIAGNOSIS — J4489 Other specified chronic obstructive pulmonary disease: Secondary | ICD-10-CM | POA: Insufficient documentation

## 2013-04-26 DIAGNOSIS — F329 Major depressive disorder, single episode, unspecified: Secondary | ICD-10-CM | POA: Diagnosis not present

## 2013-04-26 LAB — CBC WITH DIFFERENTIAL/PLATELET
Basophils Absolute: 0 10*3/uL (ref 0.0–0.1)
HCT: 42.6 % (ref 36.0–46.0)
Hemoglobin: 14.7 g/dL (ref 12.0–15.0)
Lymphocytes Relative: 36 % (ref 12–46)
MCH: 30.2 pg (ref 26.0–34.0)
MCHC: 34.5 g/dL (ref 30.0–36.0)
Monocytes Absolute: 0.9 10*3/uL (ref 0.1–1.0)
Monocytes Relative: 11 % (ref 3–12)
Neutro Abs: 4 10*3/uL (ref 1.7–7.7)
RDW: 13.9 % (ref 11.5–15.5)
WBC: 7.8 10*3/uL (ref 4.0–10.5)

## 2013-04-26 LAB — BASIC METABOLIC PANEL
BUN: 21 mg/dL (ref 6–23)
CO2: 20 mEq/L (ref 19–32)
Chloride: 105 mEq/L (ref 96–112)
Creatinine, Ser: 0.59 mg/dL (ref 0.50–1.10)
Potassium: 3.7 mEq/L (ref 3.5–5.1)

## 2013-04-26 LAB — URINE MICROSCOPIC-ADD ON

## 2013-04-26 LAB — URINALYSIS, ROUTINE W REFLEX MICROSCOPIC
Glucose, UA: NEGATIVE mg/dL
Ketones, ur: NEGATIVE mg/dL
Specific Gravity, Urine: 1.038 — ABNORMAL HIGH (ref 1.005–1.030)
pH: 5.5 (ref 5.0–8.0)

## 2013-04-26 LAB — POCT PREGNANCY, URINE: Preg Test, Ur: NEGATIVE

## 2013-04-26 MED ORDER — NAPROXEN 500 MG PO TABS: 500.0000 mg | ORAL_TABLET | Freq: Two times a day (BID) | ORAL | Status: DC

## 2013-04-26 MED ORDER — OXYCODONE-ACETAMINOPHEN 5-325 MG PO TABS
2.0000 | ORAL_TABLET | Freq: Once | ORAL | Status: AC
  Administered 2013-04-26: 2 via ORAL
  Filled 2013-04-26: qty 2

## 2013-04-26 MED ORDER — METHOCARBAMOL 500 MG PO TABS: 500.0000 mg | ORAL_TABLET | Freq: Two times a day (BID) | ORAL | Status: DC

## 2013-04-26 NOTE — ED Notes (Signed)
Bed: WA20 Expected date:  Expected time:  Means of arrival:  Comments: ems- bil flank pain, hx of kidney stones

## 2013-04-26 NOTE — ED Provider Notes (Signed)
CSN: [id number]     Arrival date & time 04/26/13  1527 History   First MD Initiated Contact with Patient 04/26/13 1536     Chief Complaint  Patient presents with  . Flank Pain   (Consider location/radiation/quality/duration/timing/severity/associated sxs/prior Treatment) HPI Comments: Patient presents today with a chief complaint of bilateral flank pain. She reports that the pain has been present for the past week and is gradually worsening.  She reports that the pain has not moved from onset.  No radiation of the pain.  She reports that the pain is worse with movement. She has been seen in the ED for the same on 03/26/13, 04/16/13, and 04/22/13.  She had a CT ab/pelvis performed five days ago, which showed tiny bilateral nonobstructive kidney stones.  She was given referral to Urology, but has not followed up.  She reports that she was taking Norco for the pain, which helped somewhat.  However, she ran out of her pain medication.  On 04/16/13 she was diagnosed with possible Pyelonephritis.  However, she did not take her antibiotics.  She reports having an allergic reaction to the keflex so only took one dose.  She denies fever or chills.  Denies nausea or vomiting.  Denies urinary symptoms.  No hematuria.  Denies vaginal discharge.  LMP 04/17/13.  She does report that she has been constipated.  CT done 5 days ago showed large stool burden.  She has not taken an enema and stool softeners like she was recommended at her last visit.  The history is provided by the patient.    Past Medical History  Diagnosis Date  . Anxiety   . Depression   . Anxiety   . Depression   . COPD (chronic obstructive pulmonary disease)    History reviewed. No pertinent past surgical history. Family History  Problem Relation Age of Onset  . Drug abuse Father   . Drug abuse Sister   . Drug abuse Brother    History  Substance Use Topics  . Smoking status: Current Every Day Smoker -- 1.00 packs/day for 10 years    Types:  Cigarettes  . Smokeless tobacco: Former Neurosurgeon  . Alcohol Use: 2.0 oz/week    4 drink(s) per week     Comment: 4 drinks   OB History   Grav Para Term Preterm Abortions TAB SAB Ect Mult Living                 Review of Systems  All other systems reviewed and are negative.    Allergies  Gabapentin; Tramadol; Vicodin; Adhesive; and Latex  Home Medications   Current Outpatient Rx  Name  Route  Sig  Dispense  Refill  . ALPRAZolam (XANAX) 1 MG tablet   Oral   Take 1 mg by mouth daily as needed for anxiety.          . beclomethasone (QVAR) 80 MCG/ACT inhaler   Inhalation   Inhale 2 puffs into the lungs 2 (two) times daily as needed (wheezing).          Marland Kitchen HYDROcodone-acetaminophen (NORCO/VICODIN) 5-325 MG per tablet   Oral   Take 1 tablet by mouth every 4 (four) hours as needed for pain.   12 tablet   0   . ibuprofen (ADVIL,MOTRIN) 200 MG tablet   Oral   Take 600 mg by mouth every 8 (eight) hours as needed for pain.          Marland Kitchen omeprazole (PRILOSEC) 40 MG capsule  Oral   Take 40 mg by mouth daily.          BP 110/81  Pulse 89  Temp(Src) 97.5 F (36.4 C) (Oral)  Resp 16  SpO2 96%  LMP 04/17/2013 Physical Exam  Nursing note and vitals reviewed. Constitutional: She appears well-developed and well-nourished. No distress.  Patient does not appear to be in distress  HENT:  Head: Normocephalic and atraumatic.  Mouth/Throat: Oropharynx is clear and moist.  Neck: Normal range of motion. Neck supple.  Cardiovascular: Normal rate, regular rhythm and normal heart sounds.   Pulmonary/Chest: Effort normal and breath sounds normal.  Abdominal: Soft. Bowel sounds are normal. There is CVA tenderness. There is no rigidity and no guarding.  Mild diffuse tenderness to palpation of the abdomen. Bilateral CVA tenderness  Neurological: She is alert.  Skin: Skin is warm and dry. She is not diaphoretic.  Psychiatric: She has a normal mood and affect.    ED Course   Procedures (including critical care time) Labs Review Labs Reviewed  CBC WITH DIFFERENTIAL  BASIC METABOLIC PANEL  URINALYSIS, ROUTINE W REFLEX MICROSCOPIC   Imaging Review No results found.  Discussed results with the patient.  She reports that pain has improved at this time.  Explained to patient that the pain may be muscular.  She is in agreement and states that she has "back problems."  She also reports that she just completed her menstrual cycle two days ago, which could be the reason for the hemoglobin in the urine.  MDM  No diagnosis found. Patient presents with bilateral flank pain.  This is her fourth visit for the same.  Last visit five days ago CT ab/pelvis was obtained, which showed tiny nonobstructive bilateral kidney stones.  Do not feel that this would cause the patient's pain.  UA today negative for infection.  Moderate hemoglobin in the urine today, however, patient just completed her menstrual cycle.  Unfortunately urine was not obtained via in and out cath.  Labs unremarkable.  Pain worse with movement.  Patient felt that the pain may be muscular and states that she has "back problems."  Will start patient on a muscle relaxer and Naproxen.  Return precautions given.      Pascal Lux [employer], PA-C 04/26/13 1755

## 2013-04-26 NOTE — ED Provider Notes (Signed)
Medical screening examination/treatment/procedure(s) were performed by non-physician practitioner and as supervising physician I was immediately available for consultation/collaboration. Jeran Hiltz, MD, FACEP   Maziah Keeling L Michaela Shankel, MD 04/26/13 1800 

## 2013-04-26 NOTE — ED Notes (Signed)
Per ems pt here for bilat flank pain pt is aware.  she has kidney stone.Pt seen last week here for same 9/27 Toradol given in route via iv denies n/v took Vicodin with no relief

## 2013-05-16 ENCOUNTER — Emergency Department (HOSPITAL_COMMUNITY)
Admission: EM | Admit: 2013-05-16 | Discharge: 2013-05-16 | Disposition: A | Discharge status: Home / Self Care | Payer: Medicare Other | Attending: Emergency Medicine | Admitting: Emergency Medicine

## 2013-05-16 ENCOUNTER — Emergency Department (HOSPITAL_COMMUNITY): Payer: Medicare Other

## 2013-05-16 ENCOUNTER — Encounter (HOSPITAL_COMMUNITY): Payer: Self-pay | Admitting: Emergency Medicine

## 2013-05-16 DIAGNOSIS — R059 Cough, unspecified: Secondary | ICD-10-CM | POA: Diagnosis not present

## 2013-05-16 DIAGNOSIS — F411 Generalized anxiety disorder: Secondary | ICD-10-CM | POA: Diagnosis not present

## 2013-05-16 DIAGNOSIS — Z91040 Latex allergy status: Secondary | ICD-10-CM | POA: Diagnosis not present

## 2013-05-16 DIAGNOSIS — J4489 Other specified chronic obstructive pulmonary disease: Secondary | ICD-10-CM | POA: Insufficient documentation

## 2013-05-16 DIAGNOSIS — J449 Chronic obstructive pulmonary disease, unspecified: Secondary | ICD-10-CM | POA: Insufficient documentation

## 2013-05-16 DIAGNOSIS — IMO0002 Reserved for concepts with insufficient information to code with codable children: Secondary | ICD-10-CM | POA: Insufficient documentation

## 2013-05-16 DIAGNOSIS — Y929 Unspecified place or not applicable: Secondary | ICD-10-CM | POA: Insufficient documentation

## 2013-05-16 DIAGNOSIS — F3289 Other specified depressive episodes: Secondary | ICD-10-CM | POA: Diagnosis not present

## 2013-05-16 DIAGNOSIS — F17200 Nicotine dependence, unspecified, uncomplicated: Secondary | ICD-10-CM | POA: Insufficient documentation

## 2013-05-16 DIAGNOSIS — N644 Mastodynia: Secondary | ICD-10-CM | POA: Insufficient documentation

## 2013-05-16 DIAGNOSIS — Z79899 Other long term (current) drug therapy: Secondary | ICD-10-CM | POA: Diagnosis not present

## 2013-05-16 DIAGNOSIS — Z3202 Encounter for pregnancy test, result negative: Secondary | ICD-10-CM | POA: Diagnosis not present

## 2013-05-16 DIAGNOSIS — R3915 Urgency of urination: Secondary | ICD-10-CM | POA: Insufficient documentation

## 2013-05-16 DIAGNOSIS — M940 Chondrocostal junction syndrome [Tietze]: Secondary | ICD-10-CM | POA: Diagnosis not present

## 2013-05-16 DIAGNOSIS — R109 Unspecified abdominal pain: Secondary | ICD-10-CM | POA: Diagnosis not present

## 2013-05-16 DIAGNOSIS — R05 Cough: Secondary | ICD-10-CM | POA: Diagnosis not present

## 2013-05-16 DIAGNOSIS — S39012A Strain of muscle, fascia and tendon of lower back, initial encounter: Secondary | ICD-10-CM

## 2013-05-16 DIAGNOSIS — R350 Frequency of micturition: Secondary | ICD-10-CM | POA: Insufficient documentation

## 2013-05-16 DIAGNOSIS — K5900 Constipation, unspecified: Secondary | ICD-10-CM | POA: Diagnosis not present

## 2013-05-16 DIAGNOSIS — F329 Major depressive disorder, single episode, unspecified: Secondary | ICD-10-CM | POA: Diagnosis not present

## 2013-05-16 DIAGNOSIS — S335XXA Sprain of ligaments of lumbar spine, initial encounter: Secondary | ICD-10-CM | POA: Diagnosis not present

## 2013-05-16 DIAGNOSIS — X58XXXA Exposure to other specified factors, initial encounter: Secondary | ICD-10-CM | POA: Insufficient documentation

## 2013-05-16 DIAGNOSIS — R0789 Other chest pain: Secondary | ICD-10-CM | POA: Insufficient documentation

## 2013-05-16 DIAGNOSIS — Y939 Activity, unspecified: Secondary | ICD-10-CM | POA: Insufficient documentation

## 2013-05-16 DIAGNOSIS — R079 Chest pain, unspecified: Secondary | ICD-10-CM | POA: Diagnosis not present

## 2013-05-16 DIAGNOSIS — R300 Dysuria: Secondary | ICD-10-CM | POA: Diagnosis not present

## 2013-05-16 DIAGNOSIS — Z87442 Personal history of urinary calculi: Secondary | ICD-10-CM | POA: Diagnosis not present

## 2013-05-16 LAB — URINE MICROSCOPIC-ADD ON

## 2013-05-16 LAB — URINALYSIS, ROUTINE W REFLEX MICROSCOPIC
Bilirubin Urine: NEGATIVE
Glucose, UA: NEGATIVE mg/dL
Ketones, ur: NEGATIVE mg/dL
Nitrite: NEGATIVE
Protein, ur: NEGATIVE mg/dL
Specific Gravity, Urine: 1.017 (ref 1.005–1.030)
Urobilinogen, UA: 0.2 mg/dL (ref 0.0–1.0)

## 2013-05-16 LAB — PREGNANCY, URINE: Preg Test, Ur: NEGATIVE

## 2013-05-16 MED ORDER — CYCLOBENZAPRINE HCL 10 MG PO TABS
5.0000 mg | ORAL_TABLET | Freq: Once | ORAL | Status: AC
  Administered 2013-05-16: 5 mg via ORAL
  Filled 2013-05-16: qty 1

## 2013-05-16 MED ORDER — KETOROLAC TROMETHAMINE 60 MG/2ML IM SOLN
60.0000 mg | Freq: Once | INTRAMUSCULAR | Status: AC
  Administered 2013-05-16: 60 mg via INTRAMUSCULAR
  Filled 2013-05-16: qty 2

## 2013-05-16 NOTE — ED Provider Notes (Signed)
CSN: [id number]     Arrival date & time 05/16/13  1658 History   First MD Initiated Contact with Patient 05/16/13 1846     Chief Complaint  Patient presents with  . Breast Pain  . burning with urination    (Consider location/radiation/quality/duration/timing/severity/associated sxs/prior Treatment) HPI  Patient is a 22 yo female with a history of kidney stones, anxiety, and depression who presents with chest pain, back spasms, and dysuria.  Chest pain started today at 3 pm. Notes on left side of sternum. Is able to point to exact location. Is a constant stabbing, shocking pain. No associated shortness of breath or diaphoresis. Notes that the pain is worsened by movement.  Back spasms have been a chronic issue. Notes in the muscles surrounding the middle of her lumbar spine. Denies radiation, numbness, weakness, incontinence. Notes this is typical of her chronic back pain.  Dysuria for the past few days. Notes suprapubic pain, frequency, and urgency. Denies hematuria and vaginal discharge. Note from chart review that patient has been evaluated for this issue 5 times in the past 2 months with prior CT scan of her abdomen to evaluate her dysuria.   Past Medical History  Diagnosis Date  . Anxiety   . Depression   . Anxiety   . Depression   . COPD (chronic obstructive pulmonary disease)    History reviewed. No pertinent past surgical history. Family History  Problem Relation Age of Onset  . Drug abuse Father   . Drug abuse Sister   . Drug abuse Brother    History  Substance Use Topics  . Smoking status: Current Every Day Smoker -- 1.00 packs/day for 10 years    Types: Cigarettes  . Smokeless tobacco: Former Neurosurgeon  . Alcohol Use: 2.0 oz/week    4 drink(s) per week     Comment: 4 drinks   OB History   Grav Para Term Preterm Abortions TAB SAB Ect Mult Living                 Review of Systems  Constitutional: Negative for fever and diaphoresis.  Respiratory: Negative for  shortness of breath.   Cardiovascular: Positive for chest pain.  Gastrointestinal: Positive for abdominal pain and constipation. Negative for diarrhea.  Genitourinary: Positive for dysuria, urgency and frequency. Negative for hematuria and vaginal discharge.  Musculoskeletal: Positive for back pain.  Neurological: Negative for weakness and numbness.    Allergies  Gabapentin; Tramadol; Vicodin; Adhesive; and Latex  Home Medications   Current Outpatient Rx  Name  Route  Sig  Dispense  Refill  . ALPRAZolam (XANAX) 1 MG tablet   Oral   Take 1 mg by mouth daily as needed for anxiety.          . beclomethasone (QVAR) 80 MCG/ACT inhaler   Inhalation   Inhale 2 puffs into the lungs 2 (two) times daily as needed (wheezing).          Marland Kitchen omeprazole (PRILOSEC) 40 MG capsule   Oral   Take 40 mg by mouth daily.          LMP 05/16/2013 Physical Exam  Constitutional: She appears well-developed and well-nourished. No distress.  HENT:  Head: Normocephalic and atraumatic.  Cardiovascular: Normal rate, regular rhythm and normal heart sounds.   Pulmonary/Chest: Effort normal and breath sounds normal. No respiratory distress. She has no wheezes. She has no rales. She exhibits tenderness (left costochondral ).  Abdominal: Soft. Bowel sounds are normal. She exhibits no distension. There  is tenderness (suprapubic). There is no rebound and no guarding.  Musculoskeletal:  Tenderness to palpation and spasm in bilateral paraspinous muscles, no midline tenderness  Neurological: She is alert.  CN intact, 5/5 strength in bilateral biceps, triceps, grip strength, hip flexors, quads, plantar and dorsiflexion, sensation to light touch intact throughout bilateral upper and lower extremities, 2+ patellar reflexes  Skin: Skin is warm and dry.  Left breast exam done that revealed no masses or tenderness of the breast tissue, there was tenderness of the underlying chest wall    ED Course  Procedures  (including critical care time) Labs Review Labs Reviewed  URINALYSIS, ROUTINE W REFLEX MICROSCOPIC - Abnormal; Notable for the following:    Hgb urine dipstick SMALL (*)    Leukocytes, UA SMALL (*)    All other components within normal limits  URINE MICROSCOPIC-ADD ON - Abnormal; Notable for the following:    Squamous Epithelial / LPF MANY (*)    Bacteria, UA MANY (*)    All other components within normal limits  URINE CULTURE  PREGNANCY, URINE   Imaging Review Dg Chest 2 View  05/16/2013   CLINICAL DATA:  Left chest pain, cough.  EXAM: CHEST  2 VIEW  COMPARISON:  None.  FINDINGS: The heart size and mediastinal contours are within normal limits. Both lungs are clear. The visualized skeletal structures are unremarkable.  IMPRESSION: No active cardiopulmonary disease.   Electronically Signed   By: Charlett Nose M.D.   On: 05/16/2013 19:47    EKG Interpretation     Ventricular Rate:  86 PR Interval:  140 QRS Duration: 76 QT Interval:  363 QTC Calculation: 435 R Axis:   61 Text Interpretation:  Sinus rhythm No old tracing to compare            MDM   1. Chest pain, musculoskeletal   2. Lumbar strain, initial encounter   3. Dysuria    7:30 pm: Patient seen and examined. Patient with onset of central chest pain around 3 pm this evening atypical in nature that is reproducible. Most likely this represents a MSK cause of pain, though concern would be for cardiac vs pulmonary cause so will obtain an EKG and CXR. Additionally with lumbar back pain worsened with movement and reproducible with palpation. This is typical of the patients chronic back pain. Will order flexeril and toradol for this pain. Also notes dysuria for the past few days with increased frequency and urgency. Will check UA. Note patient had CT abd/pelvis in September that revealed bilateral renal calculi, doubt this could be causing her abdominal discomfort.   EKG with NSR and no ST or T wave changes. CXR without  acute abnormalities. Chest pain likely musculoskeletal in nature as is back pain. Pain mildly improved following toradol and flexeril. Patient advised to use ibuprofen for pain relief. Additionally UA with small leukocytes and many squamous cells indicating no UTI, though UA sent for culture. Patient with previous evaluations of dysuria with CT in the past month for this. Patient informed of these results and discussed the findings. She voiced understanding. Given resource list for follow-up with a PCP. Return precautions were discussed with the patient.  This patient was discussed and seen with my attending Dr Lynelle Doctor.  Marikay Alar, MD [facility] PGY-2 05/16/13 10:28 pm    Glori Luis, MD 05/16/13 [phone]

## 2013-05-16 NOTE — ED Provider Notes (Signed)
I saw and evaluated the patient, reviewed the resident's note and I agree with the findings and plan.  EKG Interpretation     Ventricular Rate:  86 PR Interval:  140 QRS Duration: 76 QT Interval:  363 QTC Calculation: 435 R Axis:   61 Text Interpretation:  Sinus rhythm No old tracing to compare             CXR and EKG normal.  Likely musculoskeletal pain.  Pt already has had a workup for her dysuria including a CT scan.  Will check ua again in the ED.    Celene Kras, MD 05/16/13 [phone]

## 2013-05-16 NOTE — ED Notes (Signed)
Per EMS pt comes from home c/o burning with urination and pain across her lower back ie  kidney area. And breast painful to touch that has been going on since June.

## 2013-05-16 NOTE — ED Provider Notes (Signed)
EKG Interpretation   None        Farah Lepak R Furqan Gosselin, MD 05/16/13 2343 

## 2013-05-18 DIAGNOSIS — Z309 Encounter for contraceptive management, unspecified: Secondary | ICD-10-CM | POA: Diagnosis not present

## 2013-05-20 LAB — URINE CULTURE: Colony Count: >=100000

## 2013-05-21 NOTE — ED Notes (Signed)
Post ED Visit - Positive Culture Follow-up: Successful Patient Follow-Up  Culture assessed and recommendations reviewed by: []  Wes Dulaney, Pharm.D., BCPS []  Celedonio Miyamoto, Pharm.D., BCPS []  Georgina Pillion, [address].D., BCPS []  [place], [address].D., BCPS, AAHIVP []  Estella Husk, Pharm.D., BCPS, AAHIVP  Positive uriine culture  []  Patient discharged without antimicrobial prescription and treatment is now indicated [x]  Organism is resistant to prescribed ED discharge antimicrobial []  Patient with positive blood cultures  Changes discussed with ED provider: Irish Elders   Likely Contaminant- no treatment necessary   Larena Sox 05/21/2013, 9:08 PM

## 2013-06-06 ENCOUNTER — Encounter (HOSPITAL_COMMUNITY): Payer: Self-pay | Admitting: Emergency Medicine

## 2013-06-06 ENCOUNTER — Emergency Department (HOSPITAL_COMMUNITY)
Admission: EM | Admit: 2013-06-06 | Discharge: 2013-06-06 | Disposition: A | Discharge status: Home / Self Care | Payer: Medicare Other | Attending: Emergency Medicine | Admitting: Emergency Medicine

## 2013-06-06 ENCOUNTER — Emergency Department (HOSPITAL_COMMUNITY): Payer: Medicare Other

## 2013-06-06 DIAGNOSIS — F329 Major depressive disorder, single episode, unspecified: Secondary | ICD-10-CM | POA: Insufficient documentation

## 2013-06-06 DIAGNOSIS — IMO0002 Reserved for concepts with insufficient information to code with codable children: Secondary | ICD-10-CM | POA: Insufficient documentation

## 2013-06-06 DIAGNOSIS — J449 Chronic obstructive pulmonary disease, unspecified: Secondary | ICD-10-CM | POA: Insufficient documentation

## 2013-06-06 DIAGNOSIS — Z91040 Latex allergy status: Secondary | ICD-10-CM | POA: Diagnosis not present

## 2013-06-06 DIAGNOSIS — N63 Unspecified lump in unspecified breast: Secondary | ICD-10-CM | POA: Diagnosis not present

## 2013-06-06 DIAGNOSIS — Z79899 Other long term (current) drug therapy: Secondary | ICD-10-CM | POA: Insufficient documentation

## 2013-06-06 DIAGNOSIS — F17200 Nicotine dependence, unspecified, uncomplicated: Secondary | ICD-10-CM | POA: Diagnosis not present

## 2013-06-06 DIAGNOSIS — F3289 Other specified depressive episodes: Secondary | ICD-10-CM | POA: Diagnosis not present

## 2013-06-06 DIAGNOSIS — N61 Mastitis without abscess: Secondary | ICD-10-CM

## 2013-06-06 DIAGNOSIS — F411 Generalized anxiety disorder: Secondary | ICD-10-CM | POA: Diagnosis not present

## 2013-06-06 DIAGNOSIS — J4489 Other specified chronic obstructive pulmonary disease: Secondary | ICD-10-CM | POA: Insufficient documentation

## 2013-06-06 MED ORDER — OXYCODONE-ACETAMINOPHEN 5-325 MG PO TABS: 1.0000 | ORAL_TABLET | ORAL | Status: DC | PRN

## 2013-06-06 MED ORDER — OXYCODONE-ACETAMINOPHEN 5-325 MG PO TABS
2.0000 | ORAL_TABLET | Freq: Once | ORAL | Status: AC
  Administered 2013-06-06: 2 via ORAL
  Filled 2013-06-06 (×2): qty 2

## 2013-06-06 MED ORDER — CLINDAMYCIN HCL 150 MG PO CAPS: 150.0000 mg | ORAL_CAPSULE | Freq: Four times a day (QID) | ORAL | Status: DC

## 2013-06-06 NOTE — ED Notes (Signed)
Per [facility], patient has been having L breast pain x 1 month.   Patient also having problems with anxiety.   Patient is out of prescription medications and wanting refills.

## 2013-06-06 NOTE — ED Notes (Signed)
Harris, PA at bedside with patient.  

## 2013-06-06 NOTE — ED Provider Notes (Signed)
CSN: [id number]     Arrival date & time 06/06/13  0921 History  This chart was scribed for non-physician practitioner Arthor Captain, PA-C working with Geoffery Lyons, MD by Joaquin Music, ED Scribe. This patient was seen in room TR11C/TR11C and the patient's care was started at 12:37 PM .  Chief Complaint  Patient presents with  . L breast pain   . Anxiety   The history is provided by the patient. No language interpreter was used.   HPI Comments: Jacqueline Jones is a 22 y.o. female who presents to the Emergency Department complaining of ongoing worsening L breast pain with associated breast discharge that began 1 month ago. Pt describes her pain as "throbbing and painful". Pt states she has been tender to palpation which also includes when she is wearing a bra. Pt states she is currently on her menstrual cycle. She denies ever having tenderness while on her NMP. Pt denies having family hx of breast cancer but states many family members have had many other cancers in her family. Pt denies having an OB/GYN. Pt states she does have a PCP. Pt denies having fevers.  Past Medical History  Diagnosis Date  . Anxiety   . Depression   . Anxiety   . Depression   . COPD (chronic obstructive pulmonary disease)    History reviewed. No pertinent past surgical history. Family History  Problem Relation Age of Onset  . Drug abuse Father   . Drug abuse Sister   . Drug abuse Brother    History  Substance Use Topics  . Smoking status: Current Every Day Smoker -- 1.00 packs/day for 10 years    Types: Cigarettes  . Smokeless tobacco: Former Neurosurgeon  . Alcohol Use: 2.0 oz/week    4 drink(s) per week     Comment: 4 drinks   OB History   Grav Para Term Preterm Abortions TAB SAB Ect Mult Living                 Review of Systems  Constitutional: Negative for fever.  All other systems reviewed and are negative.    Allergies  Gabapentin; Tramadol; Vicodin; Adhesive; and Latex  Home  Medications   Current Outpatient Rx  Name  Route  Sig  Dispense  Refill  . ALPRAZolam (XANAX) 1 MG tablet   Oral   Take 1 mg by mouth daily as needed for anxiety.          . beclomethasone (QVAR) 80 MCG/ACT inhaler   Inhalation   Inhale 2 puffs into the lungs 2 (two) times daily as needed (wheezing).          Marland Kitchen omeprazole (PRILOSEC) 40 MG capsule   Oral   Take 40 mg by mouth daily.          BP 111/81  Pulse 70  Temp(Src) 98.3 F (36.8 C) (Oral)  Resp 18  SpO2 96%  LMP 05/16/2013  Physical Exam  Nursing note and vitals reviewed. Constitutional: She is oriented to person, place, and time. She appears well-developed and well-nourished. No distress.  HENT:  Head: Normocephalic and atraumatic.  Eyes: EOM are normal.  Neck: Neck supple. No tracheal deviation present.  Cardiovascular: Normal rate.   Pulmonary/Chest: Effort normal. No respiratory distress.  Genitourinary:  L breast has redness and swelling to areola and nipple. Tender palpable mass about 2cm in diameter just under L nipple. No discharge noted. No axillary lymphadenopathy.    Musculoskeletal: Normal range of motion.  Neurological:  She is alert and oriented to person, place, and time.  Skin: Skin is warm and dry.  Psychiatric: She has a normal mood and affect. Her behavior is normal.    ED Course  Procedures  DIAGNOSTIC STUDIES: Oxygen Saturation is 96% on RA, normal by my interpretation.    COORDINATION OF CARE: 12:41 PM-Discussed treatment plan which includes Breast Ultrasound and administer Percocet while in ED. Pt agreed to plan.   2:46 PM-Discussed with pt radiology findings.   Labs Review Labs Reviewed - No data to display Imaging Review US Breast Left  06/06/2013   CLINICAL DATA:  Tender retroareolar mass in the left breast.  EXAM: ULTRASOUND LEFT BREAST  COMPARISON:  None  FINDINGS: Ultrasound is performed, showing prominence of the nipple areolar complex on the left. The thickened nipple  areolar complex measures 3.0 x 2.3 x 1.0 cm on the left compared to 0.9 x 0.5 cm on the right. Within the left nipple areolar complex, there are 2 small hypoechoic collections, measuring approximately 3 mm each. The left nipple areolar complex is vascular on Doppler evaluation. Findings are consistent with inflammation/developing abscess.  For comparison, images are also performed of the right nipple areolar complex, showing no suspicious abnormalities.  IMPRESSION: 1. Findings are consistent with infection/inflammation involving the left nipple areolar complex. 2. Although there are no drainable fluid collections at this time, the findings are suspicious for developing abscess. 3. Follow-up imaging is recommended if there is no clinical response to antibiotic therapy.  RECOMMENDATION: Clinical followup.  Imaging follow-up if needed.  I have discussed the findings and recommendations with the patient. Results were also provided in writing at the conclusion of the visit. If applicable, a reminder letter will be sent to the patient regarding the next appointment.  BI-RADS CATEGORY  2: Benign Finding(s)   Electronically Signed   By: Rosalie Gums M.D.   On: 06/06/2013 14:30    EKG Interpretation   None       MDM   1. Cellulitis of female breast    Patient US shows likely developing abscess/ cellulitis. I have reviewed the images using our PACS system. Patient presentation consistent with diagnosis of Cellulitis. She will be discharged with pain medication and antibiotics   I personally performed the services described in this documentation, which was scribed in my presence. The recorded information has been reviewed and is accurate.     Arthor Captain, PA-C 06/07/13 1851

## 2013-06-06 NOTE — ED Notes (Signed)
Patient and boyfriend given bus pass per request.

## 2013-06-08 NOTE — ED Provider Notes (Signed)
Medical screening examination/treatment/procedure(s) were performed by non-physician practitioner and as supervising physician I was immediately available for consultation/collaboration.     Geoffery Lyons, MD 06/08/13 [phone]

## 2013-06-13 ENCOUNTER — Encounter (HOSPITAL_COMMUNITY): Payer: Self-pay | Admitting: Emergency Medicine

## 2013-06-13 ENCOUNTER — Emergency Department (HOSPITAL_COMMUNITY)
Admission: EM | Admit: 2013-06-13 | Discharge: 2013-06-13 | Disposition: A | Discharge status: Home / Self Care | Payer: Medicare Other | Attending: Emergency Medicine | Admitting: Emergency Medicine

## 2013-06-13 DIAGNOSIS — F329 Major depressive disorder, single episode, unspecified: Secondary | ICD-10-CM | POA: Diagnosis not present

## 2013-06-13 DIAGNOSIS — Z91040 Latex allergy status: Secondary | ICD-10-CM | POA: Diagnosis not present

## 2013-06-13 DIAGNOSIS — F3289 Other specified depressive episodes: Secondary | ICD-10-CM | POA: Insufficient documentation

## 2013-06-13 DIAGNOSIS — J449 Chronic obstructive pulmonary disease, unspecified: Secondary | ICD-10-CM | POA: Insufficient documentation

## 2013-06-13 DIAGNOSIS — F17200 Nicotine dependence, unspecified, uncomplicated: Secondary | ICD-10-CM | POA: Insufficient documentation

## 2013-06-13 DIAGNOSIS — Z79899 Other long term (current) drug therapy: Secondary | ICD-10-CM | POA: Insufficient documentation

## 2013-06-13 DIAGNOSIS — Z3202 Encounter for pregnancy test, result negative: Secondary | ICD-10-CM | POA: Insufficient documentation

## 2013-06-13 DIAGNOSIS — F419 Anxiety disorder, unspecified: Secondary | ICD-10-CM

## 2013-06-13 DIAGNOSIS — N390 Urinary tract infection, site not specified: Secondary | ICD-10-CM

## 2013-06-13 DIAGNOSIS — F411 Generalized anxiety disorder: Secondary | ICD-10-CM | POA: Diagnosis not present

## 2013-06-13 DIAGNOSIS — J4489 Other specified chronic obstructive pulmonary disease: Secondary | ICD-10-CM | POA: Insufficient documentation

## 2013-06-13 DIAGNOSIS — IMO0002 Reserved for concepts with insufficient information to code with codable children: Secondary | ICD-10-CM | POA: Diagnosis not present

## 2013-06-13 LAB — URINE MICROSCOPIC-ADD ON

## 2013-06-13 LAB — RAPID URINE DRUG SCREEN, HOSP PERFORMED
Amphetamines: NOT DETECTED
Barbiturates: NOT DETECTED
Opiates: NOT DETECTED
Tetrahydrocannabinol: POSITIVE — AB

## 2013-06-13 LAB — CBC WITH DIFFERENTIAL/PLATELET
Basophils Absolute: 0 10*3/uL (ref 0.0–0.1)
Basophils Relative: 0 % (ref 0–1)
Eosinophils Relative: 1 % (ref 0–5)
HCT: 41.1 % (ref 36.0–46.0)
Hemoglobin: 14.5 g/dL (ref 12.0–15.0)
Lymphocytes Relative: 63 % — ABNORMAL HIGH (ref 12–46)
MCHC: 35.3 g/dL (ref 30.0–36.0)
Monocytes Absolute: 0.6 10*3/uL (ref 0.1–1.0)
Monocytes Relative: 9 % (ref 3–12)
Neutro Abs: 1.6 10*3/uL — ABNORMAL LOW (ref 1.7–7.7)
Neutrophils Relative %: 26 % — ABNORMAL LOW (ref 43–77)
RDW: 13.5 % (ref 11.5–15.5)
WBC: 6.2 10*3/uL (ref 4.0–10.5)

## 2013-06-13 LAB — COMPREHENSIVE METABOLIC PANEL
AST: 33 U/L (ref 0–37)
Albumin: 3.5 g/dL (ref 3.5–5.2)
Alkaline Phosphatase: 101 U/L (ref 39–117)
CO2: 23 mEq/L (ref 19–32)
Chloride: 98 mEq/L (ref 96–112)
GFR calc non Af Amer: >90 mL/min (ref >90)
Potassium: 3.7 mEq/L (ref 3.5–5.1)
Total Bilirubin: 0.3 mg/dL (ref 0.3–1.2)
Total Protein: 7.4 g/dL (ref 6.0–8.3)

## 2013-06-13 LAB — URINALYSIS, ROUTINE W REFLEX MICROSCOPIC
Bilirubin Urine: NEGATIVE
Glucose, UA: NEGATIVE mg/dL
Hgb urine dipstick: NEGATIVE
Ketones, ur: NEGATIVE mg/dL
Protein, ur: NEGATIVE mg/dL
Urobilinogen, UA: 1 mg/dL (ref 0.0–1.0)
pH: 6.5 (ref 5.0–8.0)

## 2013-06-13 LAB — ETHANOL: Alcohol, Ethyl (B): <11 mg/dL (ref 0–11)

## 2013-06-13 LAB — PREGNANCY, URINE: Preg Test, Ur: NEGATIVE

## 2013-06-13 MED ORDER — NAPROXEN 375 MG PO TABS: 375.0000 mg | ORAL_TABLET | Freq: Two times a day (BID) | ORAL | Status: DC

## 2013-06-13 MED ORDER — SULFAMETHOXAZOLE-TRIMETHOPRIM 800-160 MG PO TABS: 1.0000 | ORAL_TABLET | Freq: Two times a day (BID) | ORAL | Status: DC

## 2013-06-13 MED ORDER — SODIUM CHLORIDE 0.9 % IV BOLUS (SEPSIS)
1000.0000 mL | Freq: Once | INTRAVENOUS | Status: AC
  Administered 2013-06-13: 1000 mL via INTRAVENOUS

## 2013-06-13 MED ORDER — CYCLOBENZAPRINE HCL 5 MG PO TABS: 5.0000 mg | ORAL_TABLET | Freq: Three times a day (TID) | ORAL | Status: DC | PRN

## 2013-06-13 NOTE — Progress Notes (Signed)
   CARE MANAGEMENT ED NOTE 06/13/2013  Patient:  Jacqueline Jones, Jacqueline Jones   Account Number:  [id number]  Date Initiated:  06/13/2013  Documentation initiated by:  Edd Arbour  Subjective/Objective Assessment:   22 yr old female medicare pt in [facility] ED without pcp listed in epic Noted 9 ED visits in last 6 months For various reason     Subjective/Objective Assessment Detail:   pcp is candace smith     Action/Plan:   CM spoke with pt who provided pcp [facility]   Action/Plan Detail:   Anticipated DC Date:  06/13/2013     Status Recommendation to Physician:   Result of Recommendation:    Other ED Services  Consult Working Plan    DC Planning Services  Other  PCP issues    Choice offered to / List presented to:            Status of service:  Completed, signed off  ED Comments:   ED Comments Detail:

## 2013-06-13 NOTE — ED Notes (Signed)
Bed: WA22 Expected date:  Expected time:  Means of arrival:  Comments: EMS 

## 2013-06-13 NOTE — ED Notes (Signed)
Per EMS pt c/o seizure like activities, pt not postictal, cbg 113; no seizure noted; pt a/o x4'; 20g to lt hand

## 2013-06-13 NOTE — ED Provider Notes (Signed)
CSN: [id number]     Arrival date & time 06/13/13  1011 History   First MD Initiated Contact with Patient 06/13/13 1017     Chief Complaint  Patient presents with  . Seizures   (Consider location/radiation/quality/duration/timing/severity/associated sxs/prior Treatment) HPI  Patient presents via EMS for possible seizure. EMS states patient was not postictal and immediately answered all their questions appropriately. Her CBG was 113. Patient's boyfriend states they were awake watching TV in bed and she started staring into space. He states he was able to snap her out of it by snapping his fingers. He states at one point she had some shaking of her arm and leg however the other leg was under the cover he didn't know if they were shaking 2. He states the last time she was the third episode seemed to last longer and he called 911. He states it lasted 10 minutes and she had shaking all over. This was the point the EMS arrived and patient was still shaking. He reports she's been having shaking every month usually about the middle of the month since August. They have been together since 2023/02/12. Patient reports her mother died in 2023/02/12 one year ago and she is feeling anxious and depressed about her death. She states she started having seizure activity after the death of her mother. She denies having a Theatre manager. She states she's never sought medical care for her seizures.  PCP ? Dr Cliffton Asters, does not now where her office is  Past Medical History  Diagnosis Date  . Anxiety   . Depression   . Anxiety   . Depression   . COPD (chronic obstructive pulmonary disease)    History reviewed. No pertinent past surgical history. Family History  Problem Relation Age of Onset  . Drug abuse Father   . Drug abuse Sister   . Drug abuse Brother    History  Substance Use Topics  . Smoking status: Current Every Day Smoker -- 1.00 packs/day for 10 years    Types: Cigarettes  . Smokeless tobacco: Former  Neurosurgeon  . Alcohol Use: 2.0 oz/week    4 drink(s) per week     Comment: 4 drinks   On disability for "mentally challenged" Lives with boyfriend Cutting her smoking down to 5 cig a day Cutting down on drinking alcohol  OB History   Grav Para Term Preterm Abortions TAB SAB Ect Mult Living                 Review of Systems  All other systems reviewed and are negative.    Allergies  Gabapentin; Tramadol; Vicodin; Adhesive; and Latex  Home Medications   Current Outpatient Rx  Name  Route  Sig  Dispense  Refill  . ALPRAZolam (XANAX) 1 MG tablet   Oral   Take 0.5 mg by mouth 2 (two) times daily.          . beclomethasone (QVAR) 80 MCG/ACT inhaler   Inhalation   Inhale 2 puffs into the lungs 2 (two) times daily as needed (wheezing).          Marland Kitchen FLUoxetine (PROZAC) 40 MG capsule   Oral   Take 40 mg by mouth daily.         Marland Kitchen omeprazole (PRILOSEC) 40 MG capsule   Oral   Take 40 mg by mouth daily.          BP 114/78  Pulse 89  Temp(Src) 97.7 F (36.5 C) (Oral)  Resp 20  SpO2 96%  LMP 05/16/2013  Vital signs normal   Physical Exam  Nursing note and vitals reviewed. Constitutional: She is oriented to person, place, and time. She appears well-developed and well-nourished.  Non-toxic appearance. She does not appear ill. No distress.  HENT:  Head: Normocephalic and atraumatic.  Right Ear: External ear normal.  Left Ear: External ear normal.  Nose: Nose normal. No mucosal edema or rhinorrhea.  Mouth/Throat: Oropharynx is clear and moist and mucous membranes are normal. No dental abscesses or uvula swelling.  No trauma to tongue  Eyes: Conjunctivae and EOM are normal. Pupils are equal, round, and reactive to light.  Neck: Normal range of motion and full passive range of motion without pain. Neck supple.  Cardiovascular: Normal rate, regular rhythm and normal heart sounds.  Exam reveals no gallop and no friction rub.   No murmur heard. Pulmonary/Chest: Effort  normal and breath sounds normal. No respiratory distress. She has no wheezes. She has no rhonchi. She has no rales. She exhibits no tenderness and no crepitus.  Abdominal: Soft. Normal appearance and bowel sounds are normal. She exhibits no distension. There is no tenderness. There is no rebound and no guarding.  Musculoskeletal: Normal range of motion. She exhibits no edema and no tenderness.  Moves all extremities well.   Neurological: She is alert and oriented to person, place, and time. She has normal strength. No cranial nerve deficit.  Skin: Skin is warm, dry and intact. No rash noted. No erythema. No pallor.  Psychiatric: Her speech is normal and behavior is normal. Her mood appears not anxious.  Flat affect    ED Course  Procedures (including critical care time)  Medications  sodium chloride 0.9 % bolus 1,000 mL (0 mLs Intravenous Stopped 06/13/13 1326)    Patient had no further episodes while in the ED. She has had no prior visits to the ED for possible seizure activity although she's been to the ED several times, 9 times in last 6 months. A prolactin level was sent which can be followed up by her PCP. I am doubtful that she had actual seizure. She does have benzodiazepines in her urine so she is not going through benzodiazepine withdrawal. She also does not have a metabolic acidosis which would be consistent with having had seizure activity several times including one that lasted 10 minutes. Patient has a history of being mentally slow and also having anxiety. She does seem distressed when she talks about her mother's death a year ago. This may be all anxiety related.  Labs Review Results for orders placed during the hospital encounter of 06/13/13  CBC WITH DIFFERENTIAL      Result Value Range   WBC 6.2  4.0 - 10.5 K/uL   RBC 4.72  3.87 - 5.11 MIL/uL   Hemoglobin 14.5  12.0 - 15.0 g/dL   HCT 96.0  45.4 - 09.8 %   MCV 87.1  78.0 - 100.0 fL   MCH 30.7  26.0 - 34.0 pg   MCHC 35.3   30.0 - 36.0 g/dL   RDW 11.9  14.7 - 82.9 %   Platelets 235  150 - 400 K/uL   Neutrophils Relative % 26 (*) 43 - 77 %   Neutro Abs 1.6 (*) 1.7 - 7.7 K/uL   Lymphocytes Relative 63 (*) 12 - 46 %   Lymphs Abs 3.9  0.7 - 4.0 K/uL   Monocytes Relative 9  3 - 12 %   Monocytes Absolute 0.6  0.1 -  1.0 K/uL   Eosinophils Relative 1  0 - 5 %   Eosinophils Absolute 0.1  0.0 - 0.7 K/uL   Basophils Relative 0  0 - 1 %   Basophils Absolute 0.0  0.0 - 0.1 K/uL  COMPREHENSIVE METABOLIC PANEL      Result Value Range   Sodium 131 (*) 135 - 145 mEq/L   Potassium 3.7  3.5 - 5.1 mEq/L   Chloride 98  96 - 112 mEq/L   CO2 23  19 - 32 mEq/L   Glucose, Bld 77  70 - 99 mg/dL   BUN 9  6 - 23 mg/dL   Creatinine, Ser 5.40  0.50 - 1.10 mg/dL   Calcium 9.1  8.4 - 98.1 mg/dL   Total Protein 7.4  6.0 - 8.3 g/dL   Albumin 3.5  3.5 - 5.2 g/dL   AST 33  0 - 37 U/L   ALT 36 (*) 0 - 35 U/L   Alkaline Phosphatase 101  39 - 117 U/L   Total Bilirubin 0.3  0.3 - 1.2 mg/dL   GFR calc non Af Amer >90  >90 mL/min   GFR calc Af Amer >90  >90 mL/min  URINALYSIS, ROUTINE W REFLEX MICROSCOPIC      Result Value Range   Color, Urine YELLOW  YELLOW   APPearance CLOUDY (*) CLEAR   Specific Gravity, Urine 1.017  1.005 - 1.030   pH 6.5  5.0 - 8.0   Glucose, UA NEGATIVE  NEGATIVE mg/dL   Hgb urine dipstick NEGATIVE  NEGATIVE   Bilirubin Urine NEGATIVE  NEGATIVE   Ketones, ur NEGATIVE  NEGATIVE mg/dL   Protein, ur NEGATIVE  NEGATIVE mg/dL   Urobilinogen, UA 1.0  0.0 - 1.0 mg/dL   Nitrite POSITIVE (*) NEGATIVE   Leukocytes, UA SMALL (*) NEGATIVE  URINE RAPID DRUG SCREEN (HOSP PERFORMED)      Result Value Range   Opiates NONE DETECTED  NONE DETECTED   Cocaine NONE DETECTED  NONE DETECTED   Benzodiazepines POSITIVE (*) NONE DETECTED   Amphetamines NONE DETECTED  NONE DETECTED   Tetrahydrocannabinol POSITIVE (*) NONE DETECTED   Barbiturates NONE DETECTED  NONE DETECTED  PREGNANCY, URINE      Result Value Range   Preg  Test, Ur NEGATIVE  NEGATIVE  ETHANOL      Result Value Range   Alcohol, Ethyl (B) <11  0 - 11 mg/dL  PROLACTIN      Result Value Range   Prolactin 8.2    URINE MICROSCOPIC-ADD ON      Result Value Range   Squamous Epithelial / LPF RARE  RARE   WBC, UA 0-2  <3 WBC/hpf   RBC / HPF 0-2  <3 RBC/hpf   Bacteria, UA MANY (*) RARE     Laboratory interpretation all normal except UTI, mild hyponatremia, positive UDS for benzos and marijuana    Imaging Review No results found.  EKG Interpretation   None       MDM   1. Anxiety   2. UTI (urinary tract infection)    Discharge Medication List as of 06/13/2013 12:56 PM    START taking these medications   Details  cyclobenzaprine (FLEXERIL) 5 MG tablet Take 1 tablet (5 mg total) by mouth 3 (three) times daily as needed for muscle spasms., Starting 06/13/2013, Until Discontinued, Print    naproxen (NAPROSYN) 375 MG tablet Take 1 tablet (375 mg total) by mouth 2 (two) times daily., Starting 06/13/2013, Until Discontinued, Print  sulfamethoxazole-trimethoprim (SEPTRA DS) 800-160 MG per tablet Take 1 tablet by mouth every 12 (twelve) hours., Starting 06/13/2013, Until Discontinued, Print        Plan discharge  Devoria Albe, MD, Franz Dell, MD 06/13/13 1520

## 2013-06-26 DIAGNOSIS — R569 Unspecified convulsions: Secondary | ICD-10-CM | POA: Diagnosis not present

## 2013-06-26 DIAGNOSIS — F39 Unspecified mood [affective] disorder: Secondary | ICD-10-CM | POA: Diagnosis not present

## 2013-07-10 ENCOUNTER — Emergency Department (HOSPITAL_COMMUNITY)
Admission: EM | Admit: 2013-07-10 | Discharge: 2013-07-10 | Disposition: A | Discharge status: Home / Self Care | Payer: Medicare Other | Attending: Emergency Medicine | Admitting: Emergency Medicine

## 2013-07-10 ENCOUNTER — Encounter (HOSPITAL_COMMUNITY): Payer: Self-pay | Admitting: Emergency Medicine

## 2013-07-10 ENCOUNTER — Emergency Department (HOSPITAL_COMMUNITY): Payer: Medicare Other

## 2013-07-10 DIAGNOSIS — F17200 Nicotine dependence, unspecified, uncomplicated: Secondary | ICD-10-CM | POA: Diagnosis not present

## 2013-07-10 DIAGNOSIS — S61412A Laceration without foreign body of left hand, initial encounter: Secondary | ICD-10-CM

## 2013-07-10 DIAGNOSIS — Z23 Encounter for immunization: Secondary | ICD-10-CM | POA: Diagnosis not present

## 2013-07-10 DIAGNOSIS — R51 Headache: Secondary | ICD-10-CM | POA: Insufficient documentation

## 2013-07-10 DIAGNOSIS — Z792 Long term (current) use of antibiotics: Secondary | ICD-10-CM | POA: Diagnosis not present

## 2013-07-10 DIAGNOSIS — Z91040 Latex allergy status: Secondary | ICD-10-CM | POA: Diagnosis not present

## 2013-07-10 DIAGNOSIS — J4489 Other specified chronic obstructive pulmonary disease: Secondary | ICD-10-CM | POA: Insufficient documentation

## 2013-07-10 DIAGNOSIS — Z791 Long term (current) use of non-steroidal anti-inflammatories (NSAID): Secondary | ICD-10-CM | POA: Diagnosis not present

## 2013-07-10 DIAGNOSIS — F329 Major depressive disorder, single episode, unspecified: Secondary | ICD-10-CM | POA: Insufficient documentation

## 2013-07-10 DIAGNOSIS — S61409A Unspecified open wound of unspecified hand, initial encounter: Secondary | ICD-10-CM | POA: Diagnosis not present

## 2013-07-10 DIAGNOSIS — F411 Generalized anxiety disorder: Secondary | ICD-10-CM | POA: Diagnosis not present

## 2013-07-10 DIAGNOSIS — F3289 Other specified depressive episodes: Secondary | ICD-10-CM | POA: Insufficient documentation

## 2013-07-10 DIAGNOSIS — R519 Headache, unspecified: Secondary | ICD-10-CM

## 2013-07-10 DIAGNOSIS — J449 Chronic obstructive pulmonary disease, unspecified: Secondary | ICD-10-CM | POA: Diagnosis not present

## 2013-07-10 DIAGNOSIS — Z3202 Encounter for pregnancy test, result negative: Secondary | ICD-10-CM | POA: Diagnosis not present

## 2013-07-10 DIAGNOSIS — X58XXXA Exposure to other specified factors, initial encounter: Secondary | ICD-10-CM | POA: Insufficient documentation

## 2013-07-10 DIAGNOSIS — Y939 Activity, unspecified: Secondary | ICD-10-CM | POA: Insufficient documentation

## 2013-07-10 DIAGNOSIS — Y929 Unspecified place or not applicable: Secondary | ICD-10-CM | POA: Insufficient documentation

## 2013-07-10 MED ORDER — METOCLOPRAMIDE HCL 5 MG/ML IJ SOLN
10.0000 mg | Freq: Once | INTRAMUSCULAR | Status: AC
  Administered 2013-07-10: 10 mg via INTRAMUSCULAR
  Filled 2013-07-10: qty 2

## 2013-07-10 MED ORDER — OXYCODONE-ACETAMINOPHEN 5-325 MG PO TABS
1.0000 | ORAL_TABLET | Freq: Once | ORAL | Status: AC
  Administered 2013-07-10: 1 via ORAL
  Filled 2013-07-10: qty 1

## 2013-07-10 MED ORDER — BACITRACIN ZINC 500 UNIT/GM EX OINT
TOPICAL_OINTMENT | Freq: Two times a day (BID) | CUTANEOUS | Status: DC
  Administered 2013-07-10: 1 via TOPICAL
  Filled 2013-07-10: qty 0.9

## 2013-07-10 MED ORDER — TETANUS-DIPHTH-ACELL PERTUSSIS 5-2.5-18.5 LF-MCG/0.5 IM SUSP
0.5000 mL | Freq: Once | INTRAMUSCULAR | Status: AC
  Administered 2013-07-10: 0.5 mL via INTRAMUSCULAR
  Filled 2013-07-10: qty 0.5
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
## 2013-07-10 NOTE — ED Notes (Signed)
Per EMS: Pt c/o headache x 2 wks.  NV at night.  NAD.

## 2013-07-14 ENCOUNTER — Emergency Department (HOSPITAL_COMMUNITY)
Admission: EM | Admit: 2013-07-14 | Discharge: 2013-07-14 | Disposition: A | Discharge status: Home / Self Care | Payer: Medicare Other | Attending: Emergency Medicine | Admitting: Emergency Medicine

## 2013-07-14 ENCOUNTER — Encounter (HOSPITAL_COMMUNITY): Payer: Self-pay | Admitting: Emergency Medicine

## 2013-07-14 ENCOUNTER — Emergency Department (HOSPITAL_COMMUNITY): Payer: Medicare Other

## 2013-07-14 DIAGNOSIS — F3289 Other specified depressive episodes: Secondary | ICD-10-CM | POA: Diagnosis not present

## 2013-07-14 DIAGNOSIS — R197 Diarrhea, unspecified: Secondary | ICD-10-CM | POA: Insufficient documentation

## 2013-07-14 DIAGNOSIS — Z91040 Latex allergy status: Secondary | ICD-10-CM | POA: Diagnosis not present

## 2013-07-14 DIAGNOSIS — R112 Nausea with vomiting, unspecified: Secondary | ICD-10-CM

## 2013-07-14 DIAGNOSIS — F411 Generalized anxiety disorder: Secondary | ICD-10-CM | POA: Insufficient documentation

## 2013-07-14 DIAGNOSIS — F17200 Nicotine dependence, unspecified, uncomplicated: Secondary | ICD-10-CM | POA: Diagnosis not present

## 2013-07-14 DIAGNOSIS — N390 Urinary tract infection, site not specified: Secondary | ICD-10-CM | POA: Insufficient documentation

## 2013-07-14 DIAGNOSIS — F329 Major depressive disorder, single episode, unspecified: Secondary | ICD-10-CM | POA: Insufficient documentation

## 2013-07-14 DIAGNOSIS — Z79899 Other long term (current) drug therapy: Secondary | ICD-10-CM | POA: Diagnosis not present

## 2013-07-14 DIAGNOSIS — J449 Chronic obstructive pulmonary disease, unspecified: Secondary | ICD-10-CM | POA: Insufficient documentation

## 2013-07-14 DIAGNOSIS — R0789 Other chest pain: Secondary | ICD-10-CM | POA: Diagnosis not present

## 2013-07-14 DIAGNOSIS — R05 Cough: Secondary | ICD-10-CM | POA: Diagnosis not present

## 2013-07-14 DIAGNOSIS — R059 Cough, unspecified: Secondary | ICD-10-CM | POA: Diagnosis not present

## 2013-07-14 DIAGNOSIS — J4489 Other specified chronic obstructive pulmonary disease: Secondary | ICD-10-CM | POA: Insufficient documentation

## 2013-07-14 LAB — URINALYSIS, ROUTINE W REFLEX MICROSCOPIC
Glucose, UA: NEGATIVE mg/dL
Nitrite: POSITIVE — AB
Specific Gravity, Urine: 1.015 (ref 1.005–1.030)

## 2013-07-14 LAB — BASIC METABOLIC PANEL
CO2: 25 mEq/L (ref 19–32)
Calcium: 8.8 mg/dL (ref 8.4–10.5)
Chloride: 99 mEq/L (ref 96–112)
Glucose, Bld: 100 mg/dL — ABNORMAL HIGH (ref 70–99)
Potassium: 4 mEq/L (ref 3.5–5.1)
Sodium: 134 mEq/L — ABNORMAL LOW (ref 135–145)

## 2013-07-14 LAB — CBC WITH DIFFERENTIAL/PLATELET
Basophils Relative: 1 % (ref 0–1)
Eosinophils Relative: 2 % (ref 0–5)
HCT: 40.8 % (ref 36.0–46.0)
Hemoglobin: 13.5 g/dL (ref 12.0–15.0)
Lymphocytes Relative: 38 % (ref 12–46)
Lymphs Abs: 2.9 10*3/uL (ref 0.7–4.0)
MCV: 91.3 fL (ref 78.0–100.0)
Monocytes Absolute: 0.6 10*3/uL (ref 0.1–1.0)
Monocytes Relative: 8 % (ref 3–12)
Platelets: 252 10*3/uL (ref 150–400)
RBC: 4.47 MIL/uL (ref 3.87–5.11)
WBC: 7.7 10*3/uL (ref 4.0–10.5)

## 2013-07-14 LAB — URINE MICROSCOPIC-ADD ON

## 2013-07-14 MED ORDER — FENTANYL CITRATE 0.05 MG/ML IJ SOLN
100.0000 ug | INTRAMUSCULAR | Status: DC | PRN
  Administered 2013-07-14: 100 ug via INTRAVENOUS
  Filled 2013-07-14: qty 2

## 2013-07-14 MED ORDER — CEPHALEXIN 500 MG PO CAPS: 500.0000 mg | ORAL_CAPSULE | Freq: Four times a day (QID) | ORAL | Status: DC

## 2013-07-14 MED ORDER — SODIUM CHLORIDE 0.9 % IV SOLN: INTRAVENOUS | Status: DC

## 2013-07-14 MED ORDER — CEPHALEXIN 500 MG PO CAPS
500.0000 mg | ORAL_CAPSULE | Freq: Once | ORAL | Status: AC
  Administered 2013-07-14: 500 mg via ORAL
  Filled 2013-07-14: qty 1

## 2013-07-14 MED ORDER — ONDANSETRON HCL 8 MG PO TABS: 8.0000 mg | ORAL_TABLET | Freq: Three times a day (TID) | ORAL | Status: DC | PRN

## 2013-07-14 MED ORDER — ONDANSETRON HCL 4 MG/2ML IJ SOLN
4.0000 mg | Freq: Once | INTRAMUSCULAR | Status: AC
  Administered 2013-07-14: 4 mg via INTRAVENOUS
  Filled 2013-07-14: qty 2

## 2013-07-14 MED ORDER — SODIUM CHLORIDE 0.9 % IV BOLUS (SEPSIS)
1000.0000 mL | Freq: Once | INTRAVENOUS | Status: AC
  Administered 2013-07-14: 1000 mL via INTRAVENOUS

## 2013-07-14 NOTE — ED Provider Notes (Signed)
CSN: [id number]     Arrival date & time 07/14/13  1617 History   First MD Initiated Contact with Patient 07/14/13 1622     Chief Complaint  Patient presents with  . flu like symptoms    (Consider location/radiation/quality/duration/timing/severity/associated sxs/prior Treatment) HPI Comments: Jacqueline Jones is a 22 y.o. female presents for evaluation of nausea, vomiting, diarrhea, and fever. She has mild, nonproductive cough, and left flank pain. She's using her usual medications, without relief. She denies dysuria, urinary frequency, or hematuria. There's been no blood in the emesis or diarrhea. She has no known sick contacts . There are no other known modifying factors.  The history is provided by the patient.    Past Medical History  Diagnosis Date  . Anxiety   . Depression   . Anxiety   . Depression   . COPD (chronic obstructive pulmonary disease)    History reviewed. No pertinent past surgical history. Family History  Problem Relation Age of Onset  . Drug abuse Father   . Drug abuse Sister   . Drug abuse Brother    History  Substance Use Topics  . Smoking status: Current Every Day Smoker -- 1.00 packs/day for 10 years    Types: Cigarettes  . Smokeless tobacco: Former Neurosurgeon  . Alcohol Use: 2.0 oz/week    4 drink(s) per week     Comment: 4 drinks   OB History   Grav Para Term Preterm Abortions TAB SAB Ect Mult Living                 Review of Systems  All other systems reviewed and are negative.    Allergies  Flexeril; Gabapentin; Tramadol; Vicodin; Adhesive; and Latex  Home Medications   Current Outpatient Rx  Name  Route  Sig  Dispense  Refill  . ALPRAZolam (XANAX) 1 MG tablet   Oral   Take 0.5 mg by mouth 2 (two) times daily.          Marland Kitchen FLUoxetine (PROZAC) 40 MG capsule   Oral   Take 40 mg by mouth daily.         Marland Kitchen loperamide (IMODIUM) 2 MG capsule   Oral   Take 2 mg by mouth as needed for diarrhea or loose stools.         Marland Kitchen  omeprazole (PRILOSEC) 40 MG capsule   Oral   Take 40 mg by mouth daily.         . cephALEXin (KEFLEX) 500 MG capsule   Oral   Take 1 capsule (500 mg total) by mouth 4 (four) times daily.   28 capsule   0   . ondansetron (ZOFRAN) 8 MG tablet   Oral   Take 1 tablet (8 mg total) by mouth every 8 (eight) hours as needed for nausea or vomiting.   20 tablet   0    BP 110/59  Pulse 89  Temp(Src) 98.1 F (36.7 C) (Oral)  Resp 18  SpO2 98%  LMP 07/09/2013 Physical Exam  Nursing note and vitals reviewed. Constitutional: She is oriented to person, place, and time. She appears well-developed and well-nourished.  HENT:  Head: Normocephalic and atraumatic.  Eyes: Conjunctivae and EOM are normal. Pupils are equal, round, and reactive to light.  Neck: Normal range of motion and phonation normal. Neck supple.  Cardiovascular: Normal rate, regular rhythm and intact distal pulses.   Pulmonary/Chest: Effort normal and breath sounds normal. No respiratory distress. She has no wheezes. She has no  rales. She exhibits no tenderness.  Abdominal: Soft. She exhibits no distension. There is no tenderness. There is no guarding.  Genitourinary:  Muscle tenderness, prevents assessment for costovertebral angle percussive tenderness.  Musculoskeletal: Normal range of motion.  Moderate bilateral mid back, tenderness to palpation.  Neurological: She is alert and oriented to person, place, and time. She exhibits normal muscle tone.  Skin: Skin is warm and dry.  Psychiatric: She has a normal mood and affect. Her behavior is normal. Judgment and thought content normal.    ED Course  Procedures (including critical care time) Labs Review Labs Reviewed  BASIC METABOLIC PANEL - Abnormal; Notable for the following:    Sodium 134 (*)    Glucose, Bld 100 (*)    All other components within normal limits  URINALYSIS, ROUTINE W REFLEX MICROSCOPIC - Abnormal; Notable for the following:    APPearance CLOUDY (*)     Hgb urine dipstick TRACE (*)    Nitrite POSITIVE (*)    Leukocytes, UA LARGE (*)    All other components within normal limits  URINE MICROSCOPIC-ADD ON - Abnormal; Notable for the following:    Squamous Epithelial / LPF FEW (*)    Bacteria, UA MANY (*)    All other components within normal limits  URINE CULTURE  CBC WITH DIFFERENTIAL   Imaging Review Dg Chest 2 View  07/14/2013   CLINICAL DATA:  Cough and chest discomfort, history of COPD and tobacco use.  EXAM: CHEST  2 VIEW  COMPARISON:  PA and lateral chest x-ray of May 16, 2013.  FINDINGS: The lungs remain mildly hyperinflated. There is no focal infiltrate or evidence of pulmonary parenchymal masses or nodules. . The cardiopericardial silhouette is normal in size. The pulmonary vascularity is not engorged. The mediastinum is normal in width. There is no pleural effusion or pneumothorax. The observed portions of the bony thorax appear normal.  IMPRESSION: There is no acute pneumonia nor evidence of CHF. There are findings which may reflect underlying COPD. Acute bronchitis cannot be excluded in the appropriate clinical setting.   Electronically Signed   By: David  [place]   On: 07/14/2013 17:06    EKG Interpretation   None       MDM   1. UTI (lower urinary tract infection)   2. Nausea vomiting and diarrhea    Evaluation, consistent with lower urinary tract infection. Doubt serious bacterial infection. Metabolic instability, or impending vascular collapse. She is stable for discharge   Nursing Notes Reviewed/ Care Coordinated, and agree without changes. Applicable Imaging Reviewed.  Interpretation of Laboratory Data incorporated into ED treatment   Plan: Home Medications- Keflex, and Zofran; Home Treatments and Observation- rest; return here if the recommended treatment, does not improve the symptoms; Recommended follow up- PCP, 1 week for check up      Flint Melter, MD 07/15/13 [id number]

## 2013-07-14 NOTE — ED Notes (Signed)
Per EMS Pt coming from home with c/o flu like symptoms x 2 days.

## 2013-07-14 NOTE — ED Notes (Signed)
Bed: WA20 Expected date: 07/14/13 Expected time: 4:10 PM Means of arrival:  Comments: EMS-Flu-like sx

## 2013-07-15 LAB — URINE CULTURE: Colony Count: >=100000

## 2013-07-23 DIAGNOSIS — R300 Dysuria: Secondary | ICD-10-CM | POA: Diagnosis not present

## 2013-07-23 DIAGNOSIS — J40 Bronchitis, not specified as acute or chronic: Secondary | ICD-10-CM | POA: Diagnosis not present

## 2013-07-23 DIAGNOSIS — H6120 Impacted cerumen, unspecified ear: Secondary | ICD-10-CM | POA: Diagnosis not present

## 2013-08-12 ENCOUNTER — Encounter (HOSPITAL_COMMUNITY): Payer: Self-pay | Admitting: Emergency Medicine

## 2013-08-12 ENCOUNTER — Emergency Department (HOSPITAL_COMMUNITY)
Admission: EM | Admit: 2013-08-12 | Discharge: 2013-08-12 | Disposition: A | Discharge status: Home / Self Care | Payer: Medicare Other | Attending: Emergency Medicine | Admitting: Emergency Medicine

## 2013-08-12 DIAGNOSIS — J449 Chronic obstructive pulmonary disease, unspecified: Secondary | ICD-10-CM | POA: Diagnosis not present

## 2013-08-12 DIAGNOSIS — N390 Urinary tract infection, site not specified: Secondary | ICD-10-CM | POA: Insufficient documentation

## 2013-08-12 DIAGNOSIS — B373 Candidiasis of vulva and vagina: Secondary | ICD-10-CM | POA: Insufficient documentation

## 2013-08-12 DIAGNOSIS — F329 Major depressive disorder, single episode, unspecified: Secondary | ICD-10-CM | POA: Diagnosis not present

## 2013-08-12 DIAGNOSIS — Z79899 Other long term (current) drug therapy: Secondary | ICD-10-CM | POA: Insufficient documentation

## 2013-08-12 DIAGNOSIS — Z792 Long term (current) use of antibiotics: Secondary | ICD-10-CM | POA: Insufficient documentation

## 2013-08-12 DIAGNOSIS — F3289 Other specified depressive episodes: Secondary | ICD-10-CM | POA: Insufficient documentation

## 2013-08-12 DIAGNOSIS — Z3202 Encounter for pregnancy test, result negative: Secondary | ICD-10-CM | POA: Diagnosis not present

## 2013-08-12 DIAGNOSIS — B3789 Other sites of candidiasis: Secondary | ICD-10-CM

## 2013-08-12 DIAGNOSIS — F411 Generalized anxiety disorder: Secondary | ICD-10-CM | POA: Insufficient documentation

## 2013-08-12 DIAGNOSIS — B3731 Acute candidiasis of vulva and vagina: Secondary | ICD-10-CM | POA: Insufficient documentation

## 2013-08-12 DIAGNOSIS — F17200 Nicotine dependence, unspecified, uncomplicated: Secondary | ICD-10-CM | POA: Insufficient documentation

## 2013-08-12 DIAGNOSIS — Z91040 Latex allergy status: Secondary | ICD-10-CM | POA: Insufficient documentation

## 2013-08-12 DIAGNOSIS — R21 Rash and other nonspecific skin eruption: Secondary | ICD-10-CM | POA: Diagnosis not present

## 2013-08-12 DIAGNOSIS — J4489 Other specified chronic obstructive pulmonary disease: Secondary | ICD-10-CM | POA: Insufficient documentation

## 2013-08-12 LAB — POCT PREGNANCY, URINE: Preg Test, Ur: NEGATIVE

## 2013-08-12 LAB — URINE MICROSCOPIC-ADD ON

## 2013-08-12 LAB — WET PREP, GENITAL
Clue Cells Wet Prep HPF POC: NONE SEEN
Trich, Wet Prep: NONE SEEN
WBC WET PREP: NONE SEEN
YEAST WET PREP: NONE SEEN

## 2013-08-12 LAB — URINALYSIS, ROUTINE W REFLEX MICROSCOPIC
BILIRUBIN URINE: NEGATIVE
Glucose, UA: NEGATIVE mg/dL
HGB URINE DIPSTICK: NEGATIVE
KETONES UR: NEGATIVE mg/dL
NITRITE: POSITIVE — AB
PH: 5.5 (ref 5.0–8.0)
Protein, ur: NEGATIVE mg/dL
Specific Gravity, Urine: 1.014 (ref 1.005–1.030)
Urobilinogen, UA: 0.2 mg/dL (ref 0.0–1.0)

## 2013-08-12 MED ORDER — FLUCONAZOLE 200 MG PO TABS: 200.0000 mg | ORAL_TABLET | Freq: Every day | ORAL | Status: DC

## 2013-08-12 MED ORDER — NYSTATIN 100000 UNIT/GM EX CREA: TOPICAL_CREAM | CUTANEOUS | Status: DC

## 2013-08-12 MED ORDER — OXYCODONE-ACETAMINOPHEN 5-325 MG PO TABS
1.0000 | ORAL_TABLET | Freq: Once | ORAL | Status: AC
  Administered 2013-08-12: 1 via ORAL
  Filled 2013-08-12: qty 1

## 2013-08-12 MED ORDER — SULFAMETHOXAZOLE-TRIMETHOPRIM 800-160 MG PO TABS: 1.0000 | ORAL_TABLET | Freq: Two times a day (BID) | ORAL | Status: DC

## 2013-08-12 MED ORDER — OXYCODONE-ACETAMINOPHEN 5-325 MG PO TABS: 1.0000 | ORAL_TABLET | ORAL | Status: DC | PRN

## 2013-08-12 NOTE — ED Notes (Addendum)
Pt reports vaginal rash x1 month from pubic to top of rectum, pain 8/10. Pt reports rash itches and is painful. Reports went to doctor 07/10/14 for rash, was given abx but had no relief of rash. Pt reports fevers in the last week. Also reports dysuria.

## 2013-08-12 NOTE — Discharge Instructions (Signed)
Take the prescribed medication as directed.  Keep genital area as dry as possible. [facility] if symptoms do not start to improve in the next few days. Return to the ED for new or worsening symptoms.

## 2013-08-12 NOTE — ED Provider Notes (Signed)
CSN: [id number]     Arrival date & time 08/12/13  0902 History   First MD Initiated Contact with Patient 08/12/13 [id number]     Chief Complaint  Patient presents with  . Vaginal Itching  . Rash   (Consider location/radiation/quality/duration/timing/severity/associated sxs/prior Treatment) The history is provided by the patient and medical records.   This is a 23 year old female with past medical history significant for anxiety, depression, presented to the ED for genital rash for the past month. States vaginal area is irritated with rash that is painful to touch.  States it is even uncomfortable for her to wear underwear or clothing. She was seen by her primary care physician for this approximately one month ago, started on antibiotic but states it did not help.  No changes in soaps or detergents to cause allergic rxn.  Has been applying topical diaper rash ointment without improvement.    Patient also endorses dysuria and urinary frequency. She states she has a history of recurrent UTIs with similar symptoms.  Denies vaginal discharge.  Notes fever at home 2 days ago that was 102F.  No abdominal pain, nausea, vomiting, diarrhea, or flank pain.  VS stable on arrival.  Past Medical History  Diagnosis Date  . Anxiety   . Depression   . Anxiety   . Depression   . COPD (chronic obstructive pulmonary disease)    History reviewed. No pertinent past surgical history. Family History  Problem Relation Age of Onset  . Drug abuse Father   . Drug abuse Sister   . Drug abuse Brother    History  Substance Use Topics  . Smoking status: Current Every Day Smoker -- 1.00 packs/day for 10 years    Types: Cigarettes  . Smokeless tobacco: Former Neurosurgeon  . Alcohol Use: 2.0 oz/week    4 drink(s) per week     Comment: 4 drinks   OB History   Grav Para Term Preterm Abortions TAB SAB Ect Mult Living                 Review of Systems  Genitourinary: Positive for dysuria, frequency and vaginal pain.   Skin: Positive for rash.  All other systems reviewed and are negative.    Allergies  Flexeril; Gabapentin; Tramadol; Vicodin; Adhesive; and Latex  Home Medications   Current Outpatient Rx  Name  Route  Sig  Dispense  Refill  . ALPRAZolam (XANAX) 1 MG tablet   Oral   Take 0.5 mg by mouth 2 (two) times daily.          . cephALEXin (KEFLEX) 500 MG capsule   Oral   Take 1 capsule (500 mg total) by mouth 4 (four) times daily.   28 capsule   0   . FLUoxetine (PROZAC) 40 MG capsule   Oral   Take 40 mg by mouth daily.         Marland Kitchen loperamide (IMODIUM) 2 MG capsule   Oral   Take 2 mg by mouth as needed for diarrhea or loose stools.         Marland Kitchen omeprazole (PRILOSEC) 40 MG capsule   Oral   Take 40 mg by mouth daily.         . ondansetron (ZOFRAN) 8 MG tablet   Oral   Take 1 tablet (8 mg total) by mouth every 8 (eight) hours as needed for nausea or vomiting.   20 tablet   0    BP 123/91  Pulse 105  Temp(Src) 97.8  F (36.6 C) (Axillary)  Resp 16  SpO2 97%  Physical Exam  Nursing note and vitals reviewed. Constitutional: She is oriented to person, place, and time. She appears well-developed and well-nourished. No distress.  HENT:  Head: Normocephalic and atraumatic.  Mouth/Throat: Oropharynx is clear and moist.  Eyes: Conjunctivae and EOM are normal. Pupils are equal, round, and reactive to light.  Neck: Normal range of motion. Neck supple.  Cardiovascular: Normal rate, regular rhythm and normal heart sounds.   Pulmonary/Chest: Effort normal and breath sounds normal. No respiratory distress. She has no wheezes.  Abdominal: Soft. Bowel sounds are normal. There is no tenderness. There is no guarding and no CVA tenderness.  Genitourinary: Vagina normal. There is rash and tenderness on the right labia. There is no lesion on the right labia. There is rash and tenderness on the left labia. There is no lesion on the left labia. Cervix exhibits no motion tenderness. Right  adnexum displays no tenderness. Left adnexum displays no tenderness.  Bilateral labia and perineal area with candidal appearing rash; skin is irritated and tender to touch; no ulcerated lesions or warts present No vaginal discharge or bleeding noted; no adnexal or CMT  Musculoskeletal: Normal range of motion. She exhibits no edema.  Neurological: She is alert and oriented to person, place, and time.  Skin: Skin is warm and dry. She is not diaphoretic.  Psychiatric: She has a normal mood and affect.    ED Course  Procedures (including critical care time) Labs Review Labs Reviewed  URINALYSIS, ROUTINE W REFLEX MICROSCOPIC - Abnormal; Notable for the following:    APPearance CLOUDY (*)    Nitrite POSITIVE (*)    Leukocytes, UA TRACE (*)    All other components within normal limits  URINE MICROSCOPIC-ADD ON - Abnormal; Notable for the following:    Squamous Epithelial / LPF MANY (*)    Bacteria, UA MANY (*)    All other components within normal limits  WET PREP, GENITAL  GC/CHLAMYDIA PROBE AMP  URINE CULTURE  POCT PREGNANCY, URINE   Imaging Review No results found.  EKG Interpretation   None       MDM   1. Candida rash of groin   2. UTI (lower urinary tract infection)    Candidal appearing rash of labia and perineum.  No herpetic lesions, genital warts, or urticarial lesions present.  u-preg negative.  U/a nitrite +, culture pending.  Wet prep unremarkable.  Gc/Chl pending.  Pt afebrile, non-toxic appearing, NAD, VS stable- ok for discharge.  Pt will be started on oral fluconazole, nystatin cream, bactrim, and percocet for pain.  She is instructed to keep genital region clean and dry, avoid using powders with corn-starch components.  She [facility] if no improvement in the next few days.  Discussed plan with pt, she agreed.  Return precautions advised.  Garlon HatchetLisa M Jerime Arif, PA-C 08/12/13 1228

## 2013-08-13 LAB — GC/CHLAMYDIA PROBE AMP
CT PROBE, AMP APTIMA: NEGATIVE
GC PROBE AMP APTIMA: NEGATIVE

## 2013-08-14 ENCOUNTER — Telehealth (HOSPITAL_COMMUNITY): Payer: Self-pay | Admitting: *Deleted

## 2013-08-14 LAB — URINE CULTURE

## 2013-08-14 NOTE — Progress Notes (Signed)
ED Antimicrobial Stewardship Positive Culture Follow Up   Jacqueline Jones is an 23 y.o. female who [facility] with a chief complaint of  Chief Complaint  Patient presents with  . Vaginal Itching  . Rash    Recent Results (from the past 720 hour(s))  URINE CULTURE     Status: None   Collection Time    08/12/13  9:20 AM      Result Value Range Status   Specimen Description URINE, CLEAN CATCH   Final   Special Requests NONE   Final   Culture  Setup Time     Final   Value: 08/12/2013 19:20     Performed at [facility]   Colony Count     Final   Value: >=100,000 COLONIES/ML     Performed at [facility]   Culture     Final   Value: ESCHERICHIA COLI     Performed at [facility]   Report Status 08/14/2013 FINAL   Final   Organism ID, Bacteria ESCHERICHIA COLI   Final  WET PREP, GENITAL     Status: None   Collection Time    08/12/13  9:44 AM      Result Value Range Status   Yeast Wet Prep HPF POC NONE SEEN  NONE SEEN Final   Trich, Wet Prep NONE SEEN  NONE SEEN Final   Clue Cells Wet Prep HPF POC NONE SEEN  NONE SEEN Final   WBC, Wet Prep HPF POC NONE SEEN  NONE SEEN Final  GC/CHLAMYDIA PROBE AMP     Status: None   Collection Time    08/12/13  9:44 AM      Result Value Range Status   CT Probe RNA NEGATIVE  NEGATIVE Final   GC Probe RNA NEGATIVE  NEGATIVE Final   Comment: (NOTE)                                                                                               **Normal Reference Range: Negative**          Assay performed using the Gen-Probe APTIMA COMBO2 (R) Assay.     Acceptable specimen types for this assay include APTIMA Swabs (Unisex,     endocervical, urethral, or vaginal), first void urine, and ThinPrep     liquid based cytology samples.     Performed at [facility]    [x]  Treated with Bactrim, organism resistant to prescribed antimicrobial []  Patient discharged originally without  antimicrobial agent and treatment is now indicated  New antibiotic prescription: Macrobid 100 mg BID x 5 days   ED Provider: Santiago GladHeather Laisure, PA-C   [name], [name] John 08/14/2013, 10:23 AM Infectious Diseases Pharmacist Phone# [phone]

## 2013-08-14 NOTE — ED Provider Notes (Signed)
Medical screening examination/treatment/procedure(s) were performed by non-physician practitioner and as supervising physician I was immediately available for consultation/collaboration.    Leata Dominy R Marvelle Span, MD 08/14/13 1113 

## 2013-08-14 NOTE — ED Notes (Signed)
Post ED Visit - Positive Culture Follow-up: Successful Patient Follow-Up  Culture assessed and recommendations reviewed by: []  Wes Dulaney, Pharm.D., BCPS [x]  Celedonio MiyamotoJeremy Frens, Pharm.D., BCPS []  Georgina PillionElizabeth Martin, Pharm.D., BCPS []  Rosewood HeightsMinh Pham, Vermont[place]arm.D., BCPS, AAHIVP []  Estella HuskMichelle Turner, Pharm.D., BCPS, AAHIVP  Positive urine culture  []  Patient discharged without antimicrobial prescription and treatment is now indicated [x]  Organism is resistant to prescribed ED discharge antimicrobial []  Patient with positive blood cultures  Changes discussed with ED provider: Santiago GladHeather Laisure New antibiotic prescription Macrobid-100 mg BID x 5 days Called to [facility]-[phone]     Larena Soxunnally, Nikos Anglemyer Marie 08/14/2013, 5:03 PM

## 2013-08-15 ENCOUNTER — Encounter: Payer: Self-pay | Admitting: Neurology

## 2013-08-15 ENCOUNTER — Ambulatory Visit (INDEPENDENT_AMBULATORY_CARE_PROVIDER_SITE_OTHER): Payer: Medicare Other | Admitting: Neurology

## 2013-08-15 VITALS — BP 104/68 | HR 70 | Temp 98.1°F | Resp 18 | Ht 60.0 in | Wt 144.3 lb

## 2013-08-15 DIAGNOSIS — R6889 Other general symptoms and signs: Principal | ICD-10-CM

## 2013-08-15 DIAGNOSIS — R569 Unspecified convulsions: Secondary | ICD-10-CM

## 2013-08-15 DIAGNOSIS — IMO0001 Reserved for inherently not codable concepts without codable children: Secondary | ICD-10-CM
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
## 2013-08-15 NOTE — Patient Instructions (Addendum)
Not really sure these are seizures.  They may be manifestation of stress and anxiety. 1.  We will get MRI of the brain to look for anything that may cause these spells. On Jan 22 at 1045  [facility]  [address] [place]  2.  We will get an EEG to record your brain waves Jan Wed 28 [facility] 8:00 [facility]( A  NO SLEEP after Midnight)  3.  No driving as you are experiencing spells involving loss of consciousness. 4.  Follow up soon after tests.

## 2013-08-16 DIAGNOSIS — R42 Dizziness and giddiness: Secondary | ICD-10-CM | POA: Diagnosis not present

## 2013-08-16 DIAGNOSIS — R55 Syncope and collapse: Secondary | ICD-10-CM | POA: Diagnosis not present

## 2013-08-17 ENCOUNTER — Inpatient Hospital Stay (HOSPITAL_COMMUNITY)
Admission: AD | Admit: 2013-08-17 | Discharge: 2013-08-17 | Disposition: A | Discharge status: Home / Self Care | Payer: Medicare Other | Source: Ambulatory Visit | Attending: Obstetrics & Gynecology | Admitting: Obstetrics & Gynecology

## 2013-08-17 ENCOUNTER — Encounter (HOSPITAL_COMMUNITY): Payer: Self-pay | Admitting: General Practice

## 2013-08-17 DIAGNOSIS — N750 Cyst of Bartholin's gland: Secondary | ICD-10-CM | POA: Diagnosis not present

## 2013-08-17 DIAGNOSIS — N390 Urinary tract infection, site not specified: Secondary | ICD-10-CM | POA: Insufficient documentation

## 2013-08-17 DIAGNOSIS — B3731 Acute candidiasis of vulva and vagina: Secondary | ICD-10-CM | POA: Insufficient documentation

## 2013-08-17 DIAGNOSIS — N949 Unspecified condition associated with female genital organs and menstrual cycle: Secondary | ICD-10-CM | POA: Insufficient documentation

## 2013-08-17 DIAGNOSIS — F17200 Nicotine dependence, unspecified, uncomplicated: Secondary | ICD-10-CM | POA: Diagnosis not present

## 2013-08-17 DIAGNOSIS — J4489 Other specified chronic obstructive pulmonary disease: Secondary | ICD-10-CM | POA: Insufficient documentation

## 2013-08-17 DIAGNOSIS — R21 Rash and other nonspecific skin eruption: Secondary | ICD-10-CM | POA: Insufficient documentation

## 2013-08-17 DIAGNOSIS — J449 Chronic obstructive pulmonary disease, unspecified: Secondary | ICD-10-CM | POA: Diagnosis not present

## 2013-08-17 DIAGNOSIS — B373 Candidiasis of vulva and vagina: Secondary | ICD-10-CM | POA: Insufficient documentation

## 2013-08-17 MED ORDER — OXYCODONE-ACETAMINOPHEN 5-325 MG PO TABS: 2.0000 | ORAL_TABLET | ORAL | Status: DC | PRN

## 2013-08-17 MED ORDER — SULFAMETHOXAZOLE-TMP DS 800-160 MG PO TABS: 1.0000 | ORAL_TABLET | Freq: Two times a day (BID) | ORAL | Status: DC

## 2013-08-17 MED ORDER — NYSTATIN 100000 UNIT/GM EX POWD: CUTANEOUS | Status: DC

## 2013-08-17 NOTE — MAU Provider Note (Signed)
History     CSN: [id number]  Arrival date and time: 08/17/13 1057   First Provider Initiated Contact with Patient 08/17/13 1236      Chief Complaint  Patient presents with  . Rash   HPI  Jacqueline Jones is a 23 yo who presents with vaginal pain x 4 days.  Pt reports the pain is 10/10, sharp and exacerbated with movement.  She denies n/v/d, fever, vaginal discharge and vaginal bleeding. Reports she is currently being treated with for a UTI and yeast infection.   OB History   Grav Para Term Preterm Abortions TAB SAB Ect Mult Living                  Past Medical History  Diagnosis Date  . Anxiety   . Depression   . Anxiety   . Depression   . COPD (chronic obstructive pulmonary disease)     History reviewed. No pertinent past surgical history.  Family History  Problem Relation Age of Onset  . Drug abuse Father   . Drug abuse Sister   . Drug abuse Brother     History  Substance Use Topics  . Smoking status: Current Every Day Smoker -- 0.25 packs/day for 10 years    Types: Cigarettes  . Smokeless tobacco: Former NeurosurgeonUser     Comment: she is wanting to quit trying  . Alcohol Use: 2.0 oz/week    4 drink(s) per week     Comment: 4 drinks    Allergies:  Allergies  Allergen Reactions  . Flexeril [Cyclobenzaprine] Other (See Comments)    seizure  . Gabapentin Other (See Comments)    seizure  . Tramadol Itching    Seizures  . Vicodin [Hydrocodone-Acetaminophen] Itching    Pt states she can take Tylenol without difficulty.  . Adhesive [Tape] Rash  . Keflet [Cephalexin] Rash  . Latex Rash  . Levaquin [Levofloxacin In D5w] Rash    Prescriptions prior to admission  Medication Sig Dispense Refill  . albuterol (PROVENTIL HFA;VENTOLIN HFA) 108 (90 BASE) MCG/ACT inhaler Inhale 1 puff into the lungs every 6 (six) hours as needed for wheezing or shortness of breath.      . ALPRAZolam (XANAX) 1 MG tablet Take 0.5 mg by mouth 2 (two) times daily.       Marland Kitchen. FLUoxetine (PROZAC)  40 MG capsule Take 40 mg by mouth daily.      . nitrofurantoin, macrocrystal-monohydrate, (MACROBID) 100 MG capsule Take 100 mg by mouth 2 (two) times daily. X 5 days      . nystatin cream (MYCOSTATIN) Apply to affected area 2 times daily  15 g  1  . omeprazole (PRILOSEC) 40 MG capsule Take 40 mg by mouth daily.        Review of Systems  Constitutional: Negative for fever and chills.  Eyes: Negative for blurred vision.  Respiratory: Negative for cough.   Cardiovascular: Negative for chest pain and orthopnea.  Gastrointestinal: Negative for nausea, vomiting, abdominal pain, diarrhea and constipation.  Genitourinary: Negative for dysuria and urgency.  Musculoskeletal: Negative for myalgias.  Neurological: Negative for dizziness and headaches.   Physical Exam   Blood pressure 111/80, pulse 91, temperature 98.4 F (36.9 C), temperature source Oral, resp. rate 20, height 5' (1.524 m), weight 66.679 kg (147 lb), last menstrual period 07/26/2013.  Physical Exam  Constitutional: She is oriented to person, place, and time. She appears well-developed and well-nourished. She appears distressed.  HENT:  Head: Normocephalic and atraumatic.  Cardiovascular:  Normal rate and normal heart sounds.   Respiratory: Effort normal and breath sounds normal.  GI: Soft. Bowel sounds are normal. She exhibits no distension. There is no tenderness.  Musculoskeletal: She exhibits no edema and no tenderness.  Neurological: She is alert and oriented to person, place, and time.  Skin: Skin is warm.  Psychiatric: She has a normal mood and affect. Her behavior is normal.  Moderate size cyst on left labia. Red angry lesion on labia c/w yeast infection  MAU Course  Procedures  MDM I&D as below of bartholin cyst.   Assessment and Plan  23 y.o. F w/ bartholin cyst of left labia. Will tx with bactrim because of minimal output and have patient f/u as below. Percocet #20 for pain control. Message sent to  clinic.  Nystatin powder for rash  Pt unable to afford Abx, not an absolute indication - recommend return for worsening of symptoms.  Tawana Scale 08/17/2013, 1:47 PM   Bartholin Cyst I&D and Word Catheter Placement Enlarged abscess palpated in front of the hymenal ring around 7 o' clock.  Verbal informed consent was obtained.  Discussed complications and possible outcomes of procedure including recurrence of cyst, scarring leading to infecton, bleeding, dyspareunia, distortion of anatomy.  Patient was examined in the dorsal lithotomy position and mass was identified.  The area was prepped with Iodine. 1% Lidocaine (4 ml) was then used to infiltrate area on top of the cyst, behind the hymenal ring.  A 7 mm incision was made using a sterile scapel. Upon palpation of the mass, a moderate amount of bloody purulent drainage was expressed through the incision. A Q-tip was used to break up loculations, which resulted in expression of minimal additional bloody purulent drainage. The open cyst was then copiously irrigated with normal saline, and a Word catheter was placed. 1 ml of sterile water was used to inflate the catheter balloon.  The end of the catheter was tucked into the vagina.  Patient tolerated the procedure well, reported feeling improvement  - Recommended Sitz baths bid and Motrin and Percocet was given  prn pain.   She was told to call to be examined if she experiences increasing swelling, pain, vaginal discharge, or fever.   - She was instructed to wear a peripad to absorb discharge, and to maintain pelvic rest while the Word catheter is in place.  -The catheter will be left in place for at least two to four weeks to promote formation of an epithelialized tract for permanent drainage of glandular secretions.  - She will need an appointment [facility] Clinic[facility]6 weeks from now for removal of word catheter.

## 2013-08-17 NOTE — Discharge Instructions (Signed)
Bartholin's Cyst or Abscess Bartholin's glands are small glands located within the folds of skin (labia) along the sides of the lower opening of the vagina (birth canal). A cyst may develop when the duct of the gland becomes blocked. When this happens, fluid that accumulates within the cyst can become infected. This is known as an abscess. The Bartholin gland produces a mucous fluid to lubricate the outside of the vagina during sexual intercourse. SYMPTOMS   Patients with a small cyst may not have any symptoms.  Mild discomfort to severe pain depending on the size of the cyst and if it is infected (abscess).  Pain, redness, and swelling around the lower opening of the vagina.  Painful intercourse.  Pressure in the perineal area.  Swelling of the lips of the vagina (labia).  The cyst or abscess can be on one side or both sides of the vagina. DIAGNOSIS   A large swelling is seen in the lower vagina area by your caregiver.  Painful to touch.  Redness and pain, if it is an abscess. TREATMENT   Sometimes the cyst will go away on its own.  Apply warm wet compresses to the area or take hot sitz baths several times a day.  An incision to drain the cyst or abscess with local anesthesia.  Culture the pus, if it is an abscess.  Antibiotic treatment, if it is an abscess.  Cut open the gland and suture the edges to make the opening of the gland bigger (marsupialization).  Remove the whole gland if the cyst or abscess returns. PREVENTION   Practice good hygiene.  Clean the vaginal area with a mild soap and soft cloth when bathing.  Do not rub hard in the vaginal area when bathing.  Protect the crotch area with a padded cushion if you take long bike rides or ride horses.  Be sure you are well lubricated when you have sexual intercourse. HOME CARE INSTRUCTIONS   If your cyst or abscess was opened, a small piece of gauze, or a drain, may have been placed in the wound to allow  drainage. Do not remove this gauze or drain unless directed by your caregiver.  Wear feminine pads, not tampons, as needed for any drainage or bleeding.  If antibiotics were prescribed, take them exactly as directed. Finish the entire course.  Only take over-the-counter or prescription medicines for pain, discomfort, or fever as directed by your caregiver. SEEK IMMEDIATE MEDICAL CARE IF:   You have an increase in pain, redness, swelling, or drainage.  You have bleeding from the wound which results in the use of more than the number of pads suggested by your caregiver in 24 hours.  You have chills.  You have a fever.  You develop any new problems (symptoms) or aggravation of your existing condition. MAKE SURE YOU:   Understand these instructions.  Will watch your condition.  Will get help right away if you are not doing well or get worse. Document Released: 07/12/2005 Document Revised: 10/04/2011 Document Reviewed: 02/28/2008 ExitCare Patient Information 2014 [employer].  

## 2013-08-17 NOTE — MAU Note (Addendum)
Has a rash down there, is taking an antibiotic. Now has a big cyst, first noted a few days- after [facility]. External. ? Labial or bartholin's from description. Very painful-difficult to sit or walk.

## 2013-08-17 NOTE — MAU Note (Signed)
Pt presents to MAU with c/o painful "boil" on vulva. Pt states she is being treated for a perineal rash but the boil appeared starting on Monday and has grown since then and has become extremely painful.

## 2013-08-22 ENCOUNTER — Other Ambulatory Visit (HOSPITAL_COMMUNITY): Payer: Self-pay

## 2013-08-23 NOTE — Telephone Encounter (Signed)
Called to let patient know her MRI of brian was normal did not get a answer on mobile or home phone no message left

## 2013-08-23 NOTE — Telephone Encounter (Signed)
We can try calling her again later.  If we cannot make contact or leave a message, we can send a letter stating that her MRI of the brain is normal.

## 2013-08-25 ENCOUNTER — Encounter (HOSPITAL_COMMUNITY): Payer: Self-pay | Admitting: *Deleted

## 2013-08-25 ENCOUNTER — Inpatient Hospital Stay (HOSPITAL_COMMUNITY)
Admission: AD | Admit: 2013-08-25 | Discharge: 2013-08-25 | Disposition: A | Discharge status: Home / Self Care | Payer: Medicare Other | Source: Ambulatory Visit | Attending: Obstetrics and Gynecology | Admitting: Obstetrics and Gynecology

## 2013-08-25 DIAGNOSIS — N61 Mastitis without abscess: Secondary | ICD-10-CM | POA: Diagnosis not present

## 2013-08-25 DIAGNOSIS — Y92009 Unspecified place in unspecified non-institutional (private) residence as the place of occurrence of the external cause: Secondary | ICD-10-CM | POA: Insufficient documentation

## 2013-08-25 DIAGNOSIS — N644 Mastodynia: Secondary | ICD-10-CM | POA: Diagnosis not present

## 2013-08-25 DIAGNOSIS — W503XXA Accidental bite by another person, initial encounter: Secondary | ICD-10-CM | POA: Insufficient documentation

## 2013-08-25 DIAGNOSIS — F17200 Nicotine dependence, unspecified, uncomplicated: Secondary | ICD-10-CM | POA: Diagnosis not present

## 2013-08-25 DIAGNOSIS — S2000XA Contusion of breast, unspecified breast, initial encounter: Secondary | ICD-10-CM | POA: Insufficient documentation

## 2013-08-25 LAB — URINE MICROSCOPIC-ADD ON

## 2013-08-25 LAB — URINALYSIS, ROUTINE W REFLEX MICROSCOPIC
BILIRUBIN URINE: NEGATIVE
Glucose, UA: NEGATIVE mg/dL
Hgb urine dipstick: NEGATIVE
Ketones, ur: 15 mg/dL — AB
Nitrite: POSITIVE — AB
PROTEIN: NEGATIVE mg/dL
SPECIFIC GRAVITY, URINE: 1.025 (ref 1.005–1.030)
UROBILINOGEN UA: 0.2 mg/dL (ref 0.0–1.0)
pH: 6 (ref 5.0–8.0)

## 2013-08-25 LAB — POCT PREGNANCY, URINE: Preg Test, Ur: NEGATIVE

## 2013-08-25 MED ORDER — AMOXICILLIN-POT CLAVULANATE 875-125 MG PO TABS: 1.0000 | ORAL_TABLET | Freq: Two times a day (BID) | ORAL | Status: DC

## 2013-08-25 NOTE — MAU Note (Signed)
Pt presents with complaints of hurting in her left breast. She states she noticed a small lump with swelling in the left breast for a couple of months.

## 2013-08-25 NOTE — MAU Provider Note (Signed)
Attestation of Attending Supervision of Advanced Practitioner (CNM/NP): Evaluation and management procedures were performed by the Advanced Practitioner under my supervision and collaboration.  I have reviewed the Advanced Practitioner's note and chart, and I agree with the management and plan.  Prosperity Darrough 08/25/2013 7:02 PM   

## 2013-08-25 NOTE — Discharge Instructions (Signed)
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
## 2013-09-03 ENCOUNTER — Inpatient Hospital Stay (HOSPITAL_COMMUNITY)
Admission: AD | Admit: 2013-09-03 | Discharge: 2013-09-03 | Disposition: A | Discharge status: Home / Self Care | Payer: Medicare Other | Source: Ambulatory Visit | Attending: Obstetrics and Gynecology | Admitting: Obstetrics and Gynecology

## 2013-09-03 ENCOUNTER — Encounter (HOSPITAL_COMMUNITY): Payer: Self-pay | Admitting: *Deleted

## 2013-09-03 ENCOUNTER — Inpatient Hospital Stay (HOSPITAL_COMMUNITY)
Admission: AD | Admit: 2013-09-03 | Discharge: 2013-09-03 | Discharge status: Against Medical Advice | Payer: Self-pay | Source: Ambulatory Visit | Attending: Obstetrics and Gynecology | Admitting: Obstetrics and Gynecology

## 2013-09-03 DIAGNOSIS — J069 Acute upper respiratory infection, unspecified: Secondary | ICD-10-CM | POA: Diagnosis not present

## 2013-09-03 DIAGNOSIS — J029 Acute pharyngitis, unspecified: Secondary | ICD-10-CM | POA: Insufficient documentation

## 2013-09-03 DIAGNOSIS — R0602 Shortness of breath: Secondary | ICD-10-CM | POA: Diagnosis not present

## 2013-09-03 DIAGNOSIS — F17200 Nicotine dependence, unspecified, uncomplicated: Secondary | ICD-10-CM | POA: Diagnosis not present

## 2013-09-03 DIAGNOSIS — G43909 Migraine, unspecified, not intractable, without status migrainosus: Secondary | ICD-10-CM | POA: Insufficient documentation

## 2013-09-03 LAB — URINALYSIS, ROUTINE W REFLEX MICROSCOPIC
BILIRUBIN URINE: NEGATIVE
Glucose, UA: NEGATIVE mg/dL
Hgb urine dipstick: NEGATIVE
Ketones, ur: NEGATIVE mg/dL
Leukocytes, UA: NEGATIVE
Nitrite: NEGATIVE
Protein, ur: NEGATIVE mg/dL
UROBILINOGEN UA: 0.2 mg/dL (ref 0.0–1.0)
pH: 5.5 (ref 5.0–8.0)

## 2013-09-03 MED ORDER — PROMETHAZINE HCL 25 MG/ML IJ SOLN
25.0000 mg | INTRAMUSCULAR | Status: DC
  Filled 2013-09-03: qty 1

## 2013-09-03 MED ORDER — DEXAMETHASONE SODIUM PHOSPHATE 10 MG/ML IJ SOLN
10.0000 mg | INTRAMUSCULAR | Status: AC
  Administered 2013-09-03: 10 mg via INTRAVENOUS
  Filled 2013-09-03: qty 1

## 2013-09-03 MED ORDER — METOCLOPRAMIDE HCL 5 MG/ML IJ SOLN
10.0000 mg | INTRAMUSCULAR | Status: AC
  Administered 2013-09-03: 10 mg via INTRAVENOUS
  Filled 2013-09-03: qty 2

## 2013-09-03 MED ORDER — IBUPROFEN 600 MG PO TABS
600.0000 mg | ORAL_TABLET | ORAL | Status: AC
  Administered 2013-09-03: 600 mg via ORAL
  Filled 2013-09-03: qty 1

## 2013-09-03 MED ORDER — SODIUM CHLORIDE 0.9 % IV BOLUS (SEPSIS)
1000.0000 mL | Freq: Once | INTRAVENOUS | Status: AC
  Administered 2013-09-03: 1000 mL via INTRAVENOUS

## 2013-09-03 NOTE — MAU Note (Signed)
Patient presents to MAU with c/o sore throat, shob, and a headache since yesterday. Reports fever, 65F at home, and chills. States have used ibuprofen and dayquil at home with no relief.

## 2013-09-03 NOTE — Discharge Instructions (Signed)
Migraine Headache °A migraine headache is an intense, throbbing pain on one or both sides of your head. A migraine can last for 30 minutes to several hours. °CAUSES  °The exact cause of a migraine headache is not always known. However, a migraine may be caused when nerves in the brain become irritated and release chemicals that cause inflammation. This causes pain. °Certain things may also trigger migraines, such as: °· Alcohol. °· Smoking. °· Stress. °· Menstruation. °· Aged cheeses. °· Foods or drinks that contain nitrates, glutamate, aspartame, or tyramine. °· Lack of sleep. °· Chocolate. °· Caffeine. °· Hunger. °· Physical exertion. °· Fatigue. °· Medicines used to treat chest pain (nitroglycerine), birth control pills, estrogen, and some blood pressure medicines. °SIGNS AND SYMPTOMS °· Pain on one or both sides of your head. °· Pulsating or throbbing pain. °· Severe pain that prevents daily activities. °· Pain that is aggravated by any physical activity. °· Nausea, vomiting, or both. °· Dizziness. °· Pain with exposure to bright lights, loud noises, or activity. °· General sensitivity to bright lights, loud noises, or smells. °Before you get a migraine, you may get warning signs that a migraine is coming (aura). An aura may include: °· Seeing flashing lights. °· Seeing bright spots, halos, or zig-zag lines. °· Having tunnel vision or blurred vision. °· Having feelings of numbness or tingling. °· Having trouble talking. °· Having muscle weakness. °DIAGNOSIS  °A migraine headache is often diagnosed based on: °· Symptoms. °· Physical exam. °· A CT scan or MRI of your head. These imaging tests cannot diagnose migraines, but they can help rule out other causes of headaches. °TREATMENT °Medicines may be given for pain and nausea. Medicines can also be given to help prevent recurrent migraines.  °HOME CARE INSTRUCTIONS °· Only take over-the-counter or prescription medicines for pain or discomfort as directed by your  health care provider. The use of long-term narcotics is not recommended. °· Lie down in a dark, quiet room when you have a migraine. °· Keep a journal to find out what may trigger your migraine headaches. For example, write down: °· What you eat and drink. °· How much sleep you get. °· Any change to your diet or medicines. °· Limit alcohol consumption. °· Quit smoking if you smoke. °· Get 7 9 hours of sleep, or as recommended by your health care provider. °· Limit stress. °· Keep lights dim if bright lights bother you and make your migraines worse. °SEEK IMMEDIATE MEDICAL CARE IF:  °· Your migraine becomes severe. °· You have a fever. °· You have a stiff neck. °· You have vision loss. °· You have muscular weakness or loss of muscle control. °· You start losing your balance or have trouble walking. °· You feel faint or pass out. °· You have severe symptoms that are different from your first symptoms. °MAKE SURE YOU:  °· Understand these instructions. °· Will watch your condition. °· Will get help right away if you are not doing well or get worse. °Document Released: 07/12/2005 Document Revised: 05/02/2013 Document Reviewed: 03/19/2013 °ExitCare® Patient Information ©2014 [employer]. ° °Upper Respiratory Infection, Adult °An upper respiratory infection (URI) is also sometimes known as the common cold. The upper respiratory tract includes the nose, sinuses, throat, trachea, and bronchi. Bronchi are the airways leading to the lungs. Most people improve within 1 week, but symptoms can last up to 2 weeks. A residual cough may last even longer.  °CAUSES °Many different viruses can infect the tissues   lining the upper respiratory tract. The tissues become irritated and inflamed and often become very moist. Mucus production is also common. A cold is contagious. You can easily spread the virus to others by oral contact. This includes kissing, sharing a glass, coughing, or sneezing. Touching your mouth or nose and then  touching a surface, which is then touched by another person, can also spread the virus. °SYMPTOMS  °Symptoms typically develop 1 to 3 days after you come in contact with a cold virus. Symptoms vary from person to person. They may include: °· Runny nose. °· Sneezing. °· Nasal congestion. °· Sinus irritation. °· Sore throat. °· Loss of voice (laryngitis). °· Cough. °· Fatigue. °· Muscle aches. °· Loss of appetite. °· Headache. °· Low-grade fever. °DIAGNOSIS  °You might diagnose your own cold based on familiar symptoms, since most people get a cold 2 to 3 times a year. Your caregiver can confirm this based on your exam. Most importantly, your caregiver can check that your symptoms are not due to another disease such as strep throat, sinusitis, pneumonia, asthma, or epiglottitis. Blood tests, throat tests, and X-rays are not necessary to diagnose a common cold, but they may sometimes be helpful in excluding other more serious diseases. Your caregiver will decide if any further tests are required. °RISKS AND COMPLICATIONS  °You may be at risk for a more severe case of the common cold if you smoke cigarettes, have chronic heart disease (such as heart failure) or lung disease (such as asthma), or if you have a weakened immune system. The very young and very old are also at risk for more serious infections. Bacterial sinusitis, middle ear infections, and bacterial pneumonia can complicate the common cold. The common cold can worsen asthma and chronic obstructive pulmonary disease (COPD). Sometimes, these complications can require emergency medical care and may be life-threatening. °PREVENTION  °The best way to protect against getting a cold is to practice good hygiene. Avoid oral or hand contact with people with cold symptoms. Wash your hands often if contact occurs. There is no clear evidence that vitamin C, vitamin E, echinacea, or exercise reduces the chance of developing a cold. However, it is always recommended to get  plenty of rest and practice good nutrition. °TREATMENT  °Treatment is directed at relieving symptoms. There is no cure. Antibiotics are not effective, because the infection is caused by a virus, not by bacteria. Treatment may include: °· Increased fluid intake. Sports drinks offer valuable electrolytes, sugars, and fluids. °· Breathing heated mist or steam (vaporizer or shower). °· Eating chicken soup or other clear broths, and maintaining good nutrition. °· Getting plenty of rest. °· Using gargles or lozenges for comfort. °· Controlling fevers with ibuprofen or acetaminophen as directed by your caregiver. °· Increasing usage of your inhaler if you have asthma. °Zinc gel and zinc lozenges, taken in the first 24 hours of the common cold, can shorten the duration and lessen the severity of symptoms. Pain medicines may help with fever, muscle aches, and throat pain. A variety of non-prescription medicines are available to treat congestion and runny nose. Your caregiver can make recommendations and may suggest nasal or lung inhalers for other symptoms.  °HOME CARE INSTRUCTIONS  °· Only take over-the-counter or prescription medicines for pain, discomfort, or fever as directed by your caregiver. °· Use a warm mist humidifier or inhale steam from a shower to increase air moisture. This may keep secretions moist and make it easier to breathe. °· Drink enough water   and fluids to keep your urine clear or pale yellow. °· Rest as needed. °· Return to work when your temperature has returned to normal or as your caregiver advises. You may need to stay home longer to avoid infecting others. You can also use a face mask and careful hand washing to prevent spread of the virus. °SEEK MEDICAL CARE IF:  °· After the first few days, you feel you are getting worse rather than better. °· You need your caregiver's advice about medicines to control symptoms. °· You develop chills, worsening shortness of breath, or brown or red sputum. These  may be signs of pneumonia. °· You develop yellow or brown nasal discharge or pain in the face, especially when you bend forward. These may be signs of sinusitis. °· You develop a fever, swollen neck glands, pain with swallowing, or white areas in the back of your throat. These may be signs of strep throat. °SEEK IMMEDIATE MEDICAL CARE IF:  °· You have a fever. °· You develop severe or persistent headache, ear pain, sinus pain, or chest pain. °· You develop wheezing, a prolonged cough, cough up blood, or have a change in your usual mucus (if you have chronic lung disease). °· You develop sore muscles or a stiff neck. °Document Released: 01/05/2001 Document Revised: 10/04/2011 Document Reviewed: 11/13/2010 °ExitCare® Patient Information ©2014 [employer]. ° °

## 2013-09-03 NOTE — MAU Provider Note (Signed)
Chief Complaint: sore throat , Migraine and Shortness of Breath   None    SUBJECTIVE HPI: Jacqueline Jones is a 23 y.o. G0P0 who presents to maternity admissions reporting sore throat x2 days with temp of 99.3 at home and migraine x24 hours.  She reports that she thinks her glands are swollen in her neck.  She initially denies known exposure to illness but when her s/o arrived, he reported a sore throat also.  She has hx of migraines and reports she is sure this is a migraine.  She does report light sensitivity and nausea, without vomiting.  She denies abdominal pain, vaginal bleeding, vaginal itching/burning, urinary symptoms, or dizziness.   Past Medical History  Diagnosis Date  . Anxiety   . Depression   . Anxiety   . Depression   . COPD (chronic obstructive pulmonary disease)    History reviewed. No pertinent past surgical history. History   Social History  . Marital Status: Single    Spouse Name: N/A    Number of Children: N/A  . Years of Education: N/A   Occupational History  . Not on file.   Social History Main Topics  . Smoking status: Current Every Day Smoker -- 0.25 packs/day for 10 years    Types: Cigarettes  . Smokeless tobacco: Former NeurosurgeonUser     Comment: she is wanting to quit trying  . Alcohol Use: 2.0 oz/week    4 drink(s) per week     Comment: 4 drinks  . Drug Use: No     Comment: Marijuana  . Sexual Activity: No   Other Topics Concern  . Not on file   Social History Narrative  . No narrative on file   No current facility-administered medications on file prior to encounter.   Current Outpatient Prescriptions on File Prior to Encounter  Medication Sig Dispense Refill  . albuterol (PROVENTIL HFA;VENTOLIN HFA) 108 (90 BASE) MCG/ACT inhaler Inhale 1 puff into the lungs every 6 (six) hours as needed for wheezing or shortness of breath.      . ALPRAZolam (XANAX) 1 MG tablet Take 0.5 mg by mouth 2 (two) times daily.       Marland Kitchen. FLUoxetine (PROZAC) 40 MG  capsule Take 40 mg by mouth daily.       Allergies  Allergen Reactions  . Flexeril [Cyclobenzaprine] Other (See Comments)    seizure  . Gabapentin Other (See Comments)    seizure  . Tramadol Itching    Seizures  . Vicodin [Hydrocodone-Acetaminophen] Itching    Pt states she can take Tylenol without difficulty.  . Adhesive [Tape] Rash  . Keflet [Cephalexin] Rash  . Latex Rash  . Levaquin [Levofloxacin In D5w] Rash    ROS: Pertinent items in HPI  OBJECTIVE Blood pressure 110/72, pulse 95, temperature 98.8 F (37.1 C), temperature source Oral, resp. rate 18, height 5' (1.524 m), weight 66.543 kg (146 lb 11.2 oz), last menstrual period 08/26/2013, SpO2 100.00%. GENERAL: Well-developed, well-nourished female in mild distress.  HEENT: Normocephalic, no lymphadenopathy palpable HEART: normal rate,rhythm, heart sounds RESP: normal effort ABDOMEN: Soft, non-tender EXTREMITIES: Nontender, no edema NEURO: Alert and oriented  LAB RESULTS Results for orders placed during the hospital encounter of 09/03/13 (from the past 24 hour(s))  URINALYSIS, ROUTINE W REFLEX MICROSCOPIC     Status: Abnormal   Collection Time    09/03/13  4:25 PM      Result Value Range   Color, Urine YELLOW  YELLOW   APPearance CLEAR  CLEAR   Specific Gravity, Urine >1.030 (*) 1.005 - 1.030   pH 5.5  5.0 - 8.0   Glucose, UA NEGATIVE  NEGATIVE mg/dL   Hgb urine dipstick NEGATIVE  NEGATIVE   Bilirubin Urine NEGATIVE  NEGATIVE   Ketones, ur NEGATIVE  NEGATIVE mg/dL   Protein, ur NEGATIVE  NEGATIVE mg/dL   Urobilinogen, UA 0.2  0.0 - 1.0 mg/dL   Nitrite NEGATIVE  NEGATIVE   Leukocytes, UA NEGATIVE  NEGATIVE     ASSESSMENT 1. URI, acute   2. Migraine     PLAN NS 1000 ml, Phenergan 25 mg IV, Reglan 10 mg IV, and Decadron 10 mg IV S/O arrived with 1/2 of IV fluids remaining.  Pt reported she had no more symptoms and wanted to go home.   D/C home Drink plenty of fluids OTC cold medicines F/U with urgent  care if symptoms persist    Medication List    STOP taking these medications       oxyCODONE-acetaminophen 5-325 MG per tablet  Commonly known as:  PERCOCET/ROXICET      TAKE these medications       albuterol 108 (90 BASE) MCG/ACT inhaler  Commonly known as:  PROVENTIL HFA;VENTOLIN HFA  Inhale 1 puff into the lungs every 6 (six) hours as needed for wheezing or shortness of breath.     ALPRAZolam 1 MG tablet  Commonly known as:  XANAX  Take 0.5 mg by mouth 2 (two) times daily.     FLUoxetine 40 MG capsule  Commonly known as:  PROZAC  Take 40 mg by mouth daily.       Follow-up Information   Follow up with [facility]. (If symptoms worsen)    Specialty:  Urgent Care   Contact information:   [address] [place] [address] [phone]      Sharen Counter Certified Nurse-Midwife 09/03/2013  6:27 PM

## 2013-09-03 NOTE — MAU Provider Note (Signed)
Attestation of Attending Supervision of Advanced Practitioner (CNM/NP): Evaluation and management procedures were performed by the Advanced Practitioner under my supervision and collaboration.  I have reviewed the Advanced Practitioner's note and chart, and I agree with the management and plan.  Keylin Podolsky 09/03/2013 7:19 PM

## 2013-09-04 ENCOUNTER — Encounter (HOSPITAL_COMMUNITY): Payer: Self-pay | Admitting: *Deleted

## 2013-10-01 ENCOUNTER — Inpatient Hospital Stay (HOSPITAL_COMMUNITY)
Admission: AD | Admit: 2013-10-01 | Discharge: 2013-10-01 | Disposition: A | Discharge status: Home / Self Care | Payer: Medicare Other | Source: Ambulatory Visit | Attending: Obstetrics & Gynecology | Admitting: Obstetrics & Gynecology

## 2013-10-01 ENCOUNTER — Encounter (HOSPITAL_COMMUNITY): Payer: Self-pay | Admitting: *Deleted

## 2013-10-01 ENCOUNTER — Inpatient Hospital Stay (EMERGENCY_DEPARTMENT_HOSPITAL)
Admission: AD | Admit: 2013-10-01 | Discharge: 2013-10-01 | Disposition: A | Discharge status: Home / Self Care | Payer: Medicare Other | Source: Ambulatory Visit | Attending: Obstetrics & Gynecology | Admitting: Obstetrics & Gynecology

## 2013-10-01 DIAGNOSIS — Z3202 Encounter for pregnancy test, result negative: Secondary | ICD-10-CM | POA: Diagnosis not present

## 2013-10-01 DIAGNOSIS — J449 Chronic obstructive pulmonary disease, unspecified: Secondary | ICD-10-CM | POA: Insufficient documentation

## 2013-10-01 DIAGNOSIS — R109 Unspecified abdominal pain: Secondary | ICD-10-CM | POA: Insufficient documentation

## 2013-10-01 DIAGNOSIS — F3289 Other specified depressive episodes: Secondary | ICD-10-CM | POA: Insufficient documentation

## 2013-10-01 DIAGNOSIS — N390 Urinary tract infection, site not specified: Secondary | ICD-10-CM

## 2013-10-01 DIAGNOSIS — R112 Nausea with vomiting, unspecified: Secondary | ICD-10-CM | POA: Insufficient documentation

## 2013-10-01 DIAGNOSIS — M545 Low back pain, unspecified: Secondary | ICD-10-CM

## 2013-10-01 DIAGNOSIS — J4489 Other specified chronic obstructive pulmonary disease: Secondary | ICD-10-CM | POA: Insufficient documentation

## 2013-10-01 DIAGNOSIS — N644 Mastodynia: Secondary | ICD-10-CM | POA: Diagnosis not present

## 2013-10-01 DIAGNOSIS — F329 Major depressive disorder, single episode, unspecified: Secondary | ICD-10-CM | POA: Diagnosis not present

## 2013-10-01 DIAGNOSIS — F17200 Nicotine dependence, unspecified, uncomplicated: Secondary | ICD-10-CM | POA: Insufficient documentation

## 2013-10-01 DIAGNOSIS — M549 Dorsalgia, unspecified: Secondary | ICD-10-CM

## 2013-10-01 DIAGNOSIS — F411 Generalized anxiety disorder: Secondary | ICD-10-CM | POA: Insufficient documentation

## 2013-10-01 LAB — POCT PREGNANCY, URINE: PREG TEST UR: NEGATIVE

## 2013-10-01 LAB — URINALYSIS, ROUTINE W REFLEX MICROSCOPIC
Bilirubin Urine: NEGATIVE
Bilirubin Urine: NEGATIVE
Glucose, UA: NEGATIVE mg/dL
Glucose, UA: NEGATIVE mg/dL
HGB URINE DIPSTICK: NEGATIVE
Ketones, ur: NEGATIVE mg/dL
Ketones, ur: NEGATIVE mg/dL
NITRITE: POSITIVE — AB
NITRITE: POSITIVE — AB
Protein, ur: NEGATIVE mg/dL
Protein, ur: NEGATIVE mg/dL
SPECIFIC GRAVITY, URINE: 1.015 (ref 1.005–1.030)
SPECIFIC GRAVITY, URINE: 1.02 (ref 1.005–1.030)
UROBILINOGEN UA: 0.2 mg/dL (ref 0.0–1.0)
Urobilinogen, UA: 0.2 mg/dL (ref 0.0–1.0)
pH: 7 (ref 5.0–8.0)
pH: 7.5 (ref 5.0–8.0)

## 2013-10-01 LAB — URINE MICROSCOPIC-ADD ON

## 2013-10-01 LAB — CBC
HEMATOCRIT: 40 % (ref 36.0–46.0)
Hemoglobin: 13.3 g/dL (ref 12.0–15.0)
MCH: 29.6 pg (ref 26.0–34.0)
MCHC: 33.3 g/dL (ref 30.0–36.0)
MCV: 88.9 fL (ref 78.0–100.0)
Platelets: 246 10*3/uL (ref 150–400)
RBC: 4.5 MIL/uL (ref 3.87–5.11)
RDW: 13.6 % (ref 11.5–15.5)
WBC: 7.2 10*3/uL (ref 4.0–10.5)

## 2013-10-01 LAB — WET PREP, GENITAL
Clue Cells Wet Prep HPF POC: NONE SEEN
Trich, Wet Prep: NONE SEEN
Yeast Wet Prep HPF POC: NONE SEEN

## 2013-10-01 LAB — RAPID URINE DRUG SCREEN, HOSP PERFORMED
Amphetamines: NOT DETECTED
BARBITURATES: NOT DETECTED
BENZODIAZEPINES: POSITIVE — AB
COCAINE: NOT DETECTED
Opiates: NOT DETECTED
TETRAHYDROCANNABINOL: NOT DETECTED

## 2013-10-01 MED ORDER — KETOROLAC TROMETHAMINE 60 MG/2ML IM SOLN
INTRAMUSCULAR | Status: AC
  Filled 2013-10-01: qty 2

## 2013-10-01 MED ORDER — KETOROLAC TROMETHAMINE 60 MG/2ML IM SOLN
60.0000 mg | INTRAMUSCULAR | Status: AC
  Administered 2013-10-01: 60 mg via INTRAMUSCULAR

## 2013-10-01 MED ORDER — IBUPROFEN 600 MG PO TABS: 600.0000 mg | ORAL_TABLET | Freq: Four times a day (QID) | ORAL | Status: DC | PRN

## 2013-10-01 MED ORDER — KETOROLAC TROMETHAMINE 60 MG/2ML IM SOLN
60.0000 mg | Freq: Once | INTRAMUSCULAR | Status: AC
  Administered 2013-10-01: 60 mg via INTRAMUSCULAR
  Filled 2013-10-01: qty 2

## 2013-10-01 MED ORDER — SULFAMETHOXAZOLE-TRIMETHOPRIM 800-160 MG PO TABS: 1.0000 | ORAL_TABLET | Freq: Two times a day (BID) | ORAL | Status: DC

## 2013-10-01 NOTE — MAU Note (Signed)
Pt was seen in MAU earlier today, dx'd with UTI, medication prescribed & started with the first dose.  Pt went home & began having extreme L flank pain.  Hx of kidney stones.
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
## 2013-10-01 NOTE — Discharge Instructions (Signed)
Urinary Tract Infection Urinary tract infections (UTIs) can develop anywhere along your urinary tract. Your urinary tract is your body's drainage system for removing wastes and extra water. Your urinary tract includes two kidneys, two ureters, a bladder, and a urethra. Your kidneys are a pair of bean-shaped organs. Each kidney is about the size of your fist. They are located below your ribs, one on each side of your spine. CAUSES Infections are caused by microbes, which are microscopic organisms, including fungi, viruses, and bacteria. These organisms are so small that they can only be seen through a microscope. Bacteria are the microbes that most commonly cause UTIs. SYMPTOMS  Symptoms of UTIs may vary by age and gender of the patient and by the location of the infection. Symptoms in young women typically include a frequent and intense urge to urinate and a painful, burning feeling in the bladder or urethra during urination. Older women and men are more likely to be tired, shaky, and weak and have muscle aches and abdominal pain. A fever may mean the infection is in your kidneys. Other symptoms of a kidney infection include pain in your back or sides below the ribs, nausea, and vomiting. DIAGNOSIS To diagnose a UTI, your caregiver will ask you about your symptoms. Your caregiver also will ask to provide a urine sample. The urine sample will be tested for bacteria and white blood cells. White blood cells are made by your body to help fight infection. TREATMENT  Typically, UTIs can be treated with medication. Because most UTIs are caused by a bacterial infection, they usually can be treated with the use of antibiotics. The choice of antibiotic and length of treatment depend on your symptoms and the type of bacteria causing your infection. HOME CARE INSTRUCTIONS  If you were prescribed antibiotics, take them exactly as your caregiver instructs you. Finish the medication even if you feel better after you  have only taken some of the medication.  Drink enough water and fluids to keep your urine clear or pale yellow.  Avoid caffeine, tea, and carbonated beverages. They tend to irritate your bladder.  Empty your bladder often. Avoid holding urine for long periods of time.  Empty your bladder before and after sexual intercourse.  After a bowel movement, women should cleanse from front to back. Use each tissue only once. SEEK MEDICAL CARE IF:   You have back pain.  You develop a fever.  Your symptoms do not begin to resolve within 3 days. SEEK IMMEDIATE MEDICAL CARE IF:   You have severe back pain or lower abdominal pain.  You develop chills.  You have nausea or vomiting.  You have continued burning or discomfort with urination. MAKE SURE YOU:   Understand these instructions.  Will watch your condition.  Will get help right away if you are not doing well or get worse. Document Released: 04/21/2005 Document Revised: 01/11/2012 Document Reviewed: 08/20/2011 [facility] Patient Information 2014 [place], [place].  [facility] Guide (Revised August 2014)   Chronic Pain Problems:    [place] [place] [facility]:  [phone]           Patients need to be referred by their primary care doctor/specialist  Insufficient Money for Medicine:           United Way: call "211"     MAP Program at [facility] Department - [place] [phone] or [place] [phone]            No Primary Care Doctor:  To  locate a primary care doctor that accepts your insurance or provides certain services:           [place]: [phone]           Physician Referral Service: [phone] ask for My [place]   If no insurance, you need to see if you qualify for [facility] orange card, call to set      up appointment for eligibility/enrollment at [phone] or [phone] or visit [facility]. of Health and [employer] ([address], [place] and [address]  -[place]) to meet with a [facility] enrollment specialist.  Agencies that provide inexpensive (sliding fee scale) medical care:        [facility] - Family Medicine at [place] - [phone]      [facility]  -  [facility] [phone]      [facility] Internal Medicine - [phone]      [facility] Care & Wellness - [phone]      [facility] [phone]      [facility] Health Family Practice [phone]   [facility] - [facility] Child Health @ [place] [phone]   [facility] - [facility] Health @ [place] - [phone]   [facility] Family Practice: [phone]    Women's Clinic: [phone]    Planned Parenthood: [phone]    [facility] of the [place]    Medicaid-accepting [facility] Providers:           [facility] [phone] (No Family Planning accepted)          [address], [phone], Mon-Fri 9am-5pm          [facility] - [phone]   [address] [place], [address] [place], Mon-Thursday 8am-5pm, Fri 8am-noon   [employer] - [phone]          [address], [address], Mon-Fri 7:30am-4:30pm          [facility] - [phone]          [address] [place] 8am-5pm          [facility] - [phone] [address], [address]          Only accepts [place] [employer] patients after they have their name applied to their card  Self Pay (no insurance) in [place] [facility]:           Sickle Cell Patients:    [address] [place], [phone] [facility] Internal Medicine:   [address] [place] [phone]       [facility] and Wellness   [address], [place] [phone]  [facility] Family Practice:   [address], [phone]           [facility] Ur[facility] Care           [address] [place], [phone] [facility]   [address] [place], [phone]           [facility]           [address], [place] [phone]  [facility] - [address]           [phone], Mon-Fri 9am-7pm, [place] 9am-1pm          [facility] - Family Medicine @ [facility]          [address] [place], [phone]          [facility] - [facility]           [address], [phone] [facility] - [facility]   [address], [place] [phone]          [facility]           [address], [phone]  [facility] - [facility]    [address] [place], [place] [phone] [facility] - [facility]   [address] [phone]  Dr. Julio Sicks           [address], [place], [phone]          [facility]           [address] [phone]          [facility]             [address] [phone]          [facility]           [address] [place], [phone], 1st & 3rd Saturday every month, 10am-1pm  OTHERS:  [employer]  (Immigration [employer] Only)  [phone] (Thursday only)  Strategies for finding a Primary Care Provider:  1) Find a Doctor and Pay Out of Pocket  Although you won't have to find out who is covered by your insurance plan, it is a good idea to ask around and get recommendations. You will then need to call the office and see if the doctor you have chosen will accept you as a new patient and what types of options they offer for patients who are self-pay. Some doctors offer discounts or will set up payment plans for their patients who do not have insurance, but you will need  to ask so you aren't surprised when you get to your appointment.  2) Contact [place] [employer] - To see if you qualify for orange card access to healthcare safety net providers.  Call for appointment for eligibility/enrollment at [phone] or [phone]. (Uninsured, 0-200% FPL, qualifying info)  Applicants for [facility] are first required to see if they are eligible to enroll in the [facility] Marketplace before enrolling in [facility] (and get an exemption if they are not).  GCCN Criteria for acceptance is:    Proof of Engineering geologist exemption - form or documentation    Valid photo ID (driver's license, state identification card, passport, home country ID)    Proof of [facility] residency (e.g. drivers license, lease/landlord information, pay stubs with address, utility bill, bank statement, etc.)    Proof of income (1040, last year's tax return, W2, 4 current pay stubs, other income proof)    Proof of assets (current bank statement + 3 most recent, disability paperwork, life insurance info, tax value on autos, etc.)  3) Contact Your Local Health Department  Not all health departments have doctors that can see patients for  sick visits, but many do, so it is worth a call to see if yours does. If you don't know where your local health department is, you can check in your phone book. The CDC also has a tool to help you locate your state's health department, and many state websites also have listings of all of their local health departments.  4) Find a Walk-in Clinic  If your illness is not likely to be very severe or complicated, you may want to try a walk in clinic. These are popping up all over the country in pharmacies, drugstores, and shopping centers. They're usually staffed by nurse practitioners or physician assistants that have been trained to treat common illnesses and complaints. They're usually fairly quick and inexpensive. However, if you have serious medical issues or chronic medical  problems, these are probably not your best option   STD Testing:           [facility] [place], [place] [facility]           [address] [place], phone [phone] or [phone]           Monday - Friday, call for an appointment          [facility] Department of [facility], [place] [facility]           [address], [place], phone [phone] or [phone]           Monday - Friday, call for an appointment Abuse/Neglect:           [facility] Child Abuse Hotline: 323-794-6255           [facility] Child Abuse Hotline: 773-695-0040 (After Hours)  Emergency Shelter:  [facility] 908-772-6575  [facility] [place]- [phone]  [facility] [place] - [phone]  Youth Focus - Act Together - [phone] (ages 62-17)  Homeless Day Shelter @ [employer] - [phone]   Mammograms - Free at [facility] [phone]  Maternity Homes:           Room at the [place] of the [facility]: [phone]   (Homeless mother with children)          [facility] Alert Services: [phone] (Mothers only)   Youth Focus: [phone] (Pregnant 65-95 years old)   Adopt a Mom -([phone]  [facility]    [facility] - Lanae Boast   [address] [place] [phone]          [facility]           [address] [place]           [phone]          [place]           [address], [place]           [phone]          [facility] Dept.           [address], [place]           [phone]          [facility] Health           [phone]          [facility] Services - [employer]  [phone]          [facility] in [place]           [address]           [phone], [facility] Child Abuse Hotline           2258148805           [phone] (After Hours)  Behavioral Health Services /Substance Abuse Resources:           Alcohol and Drug Services: [phone]           [facility]: [phone]          [facility]: [phone]    Narcotics Helpline - 940-103-0802          [employer]: (405)[phone]           Residential & Outpatient Substance Abuse Program - [facility]: 623-133-8707   [employer]  [facility]al [facility] and Wellness Center - [phone] Psychological Services:          [facility]: [phone]    Therapeutic Alternatives: [phone]          [facility] Mental Health           [address] [place]           ACCESS LINE: [phone]     (24 Hour)   Mobile Crisis:    HELPLINES:  Financial risk analyst on Mental Illness - [place] [phone] [facility] on Mental Illness - [place] [phone]   Walk In [facility] - [address] - [place]  [phone]       [facility] - [phone] or [phone]  [facility] - [phone] [address] - [employer] [phone]  [facility] [phone]. [address], [place] [phone]   Dental Assistance:  If unable to pay or uninsured, contact: [facility]. to become qualified for the adult dental clinic. Patient must be enrolled in [facility] (uninsured, 0-200% FPL, qualifying info).  Enroll in [facility] first, then see Primary Care Physician assigned to you, the PCP makes a dental referral. [place] [facility] will receive referral and contacts patient for appointment.  Patients with Medicaid           [address], [phone]  [facility] (Children up to 20 + Pregnant Women) - [phone]  [facility] - [address] - Suite [phone]  If unable to pay, or uninsured:  contact [facility] Department [phone] in [place] - (Children only + Pregnant Women), [phone] in [facility]- Children only) to become qualified for the adult dental clinic  Must see if eligible to enroll in [facility] Marketplace before enrolling into the [facility] (exemption required) [phone] for an appointment)  [email or web address];   [phone].  If not eligible for ACA, then go by Department of Health and Human Services to see if eligible for orange card.  [address] [place] and [phone]  Once you get an orange card, you will have a Primary Care home who will then refer you to dental if needed.        Other IT consultant:   [facility] - [phone] (ext [phone])  [address]  Dr. Lawrence Marseillesivils - [phone]   [address]    [place] [employer] [employer] - [phone]   [address] [facility] [address]           [facility]           [address], [place], [place], [address]           [phone], Ext. [phone]           2nd and 4th Thursday of the month at 6:30am (Simple extractions only - no wisdom teeth or surgery) First come/First serve -First 10 clients served           [facility]([place], [place] and [place] residents only)          [address], [place], [place], [address]           [phone]                    [facility]           [phone]          [facility]          [phone]         [facility] - [facility]          [phone]   Transportation Options:  Ambulance - 911 - [phone] per ride Family Member to accompany patient (if stable) [place]inette Otto- [place] [employer] - [phone]  PART - [phone]  Taxi - [phone] - [place]  SCAT - [phone] (Application required)  [facility] - [phone]

## 2013-10-01 NOTE — MAU Note (Signed)
CVA tenderness on L, none on R.

## 2013-10-01 NOTE — MAU Note (Signed)
Took first dose of bactrim after leaving hospital today.  Pain in left flank worsened.  Denies Nausea or vomiting.

## 2013-10-01 NOTE — MAU Note (Signed)
Unsure if pregnant, vomiting for last few days, breasts are tender, neg HPT.  Bled for 2 days last week, unsure if period.  Lower abd pain since Friday.

## 2013-10-01 NOTE — MAU Provider Note (Signed)
Chief Complaint: Back Pain   First Provider Initiated Contact with Patient 10/01/13 1827     SUBJECTIVE HPI: Jacqueline Jones is a 23 y.o. G0P0 who presents to maternity admissions reporting "pain in my left kidney".  She reports that after she left MAU a few hours ago, she started to have severe pain in her left kidney.  She points to her left lower back for the location of the pain.  She denies vaginal bleeding, vaginal itching/burning, urinary symptoms, h/a, dizziness, n/v, or fever/chills.     Past Medical History  Diagnosis Date  . Asthma   . Anxiety   . Depression   . Anxiety   . Depression   . COPD (chronic obstructive pulmonary disease)    History reviewed. No pertinent past surgical history. History   Social History  . Marital Status: Single    Spouse Name: N/A    Number of Children: N/A  . Years of Education: N/A   Occupational History  . Not on file.   Social History Main Topics  . Smoking status: Current Every Day Smoker -- 0.25 packs/day for 10 years    Types: Cigarettes  . Smokeless tobacco: Former NeurosurgeonUser     Comment: she is wanting to quit trying  . Alcohol Use: 2.0 oz/week    4 drink(s) per week     Comment: 4 drinks  . Drug Use: No     Comment: Marijuana  . Sexual Activity: No   Other Topics Concern  . Not on file   Social History Narrative   ** Merged History Encounter **       No current facility-administered medications on file prior to encounter.   Current Outpatient Prescriptions on File Prior to Encounter  Medication Sig Dispense Refill  . albuterol (PROVENTIL HFA;VENTOLIN HFA) 108 (90 BASE) MCG/ACT inhaler Inhale 1 puff into the lungs every 6 (six) hours as needed for wheezing or shortness of breath.      . sulfamethoxazole-trimethoprim (BACTRIM DS,SEPTRA DS) 800-160 MG per tablet Take 1 tablet by mouth 2 (two) times daily.  10 tablet  0   Allergies  Allergen Reactions  . Flexeril [Cyclobenzaprine] Other (See Comments)    seizure  .  Gabapentin Other (See Comments)    seizure  . Gabapentin Other (See Comments)    secziures   . Tramadol Itching    Seizures  . Vicodin [Hydrocodone-Acetaminophen] Itching    Pt states she can take Tylenol without difficulty.  . Adhesive [Tape] Rash  . Keflet [Cephalexin] Rash  . Latex Rash  . Levaquin [Levofloxacin In D5w] Rash    ROS: Pertinent items in HPI  OBJECTIVE Blood pressure 112/87, pulse 94, temperature 98.1 F (36.7 C), temperature source Oral, resp. rate 20, last menstrual period 08/27/2013. GENERAL: Well-developed, well-nourished female in mild distress.  HEENT: Normocephalic HEART: normal rate RESP: normal effort ABDOMEN: Soft, non-tender BACK: Generalized tenderness in left lower back, Negative CVA tenderness bilaterally EXTREMITIES: Nontender, no edema NEURO: Alert and oriented   LAB RESULTS Results for orders placed during the hospital encounter of 10/01/13 (from the past 24 hour(s))  URINALYSIS, ROUTINE W REFLEX MICROSCOPIC     Status: Abnormal   Collection Time    10/01/13  6:29 PM      Result Value Ref Range   Color, Urine YELLOW  YELLOW   APPearance HAZY (*) CLEAR   Specific Gravity, Urine 1.020  1.005 - 1.030   pH 7.5  5.0 - 8.0   Glucose, UA NEGATIVE  NEGATIVE mg/dL   Hgb urine dipstick TRACE (*) NEGATIVE   Bilirubin Urine NEGATIVE  NEGATIVE   Ketones, ur NEGATIVE  NEGATIVE mg/dL   Protein, ur NEGATIVE  NEGATIVE mg/dL   Urobilinogen, UA 0.2  0.0 - 1.0 mg/dL   Nitrite POSITIVE (*) NEGATIVE   Leukocytes, UA TRACE (*) NEGATIVE  URINE MICROSCOPIC-ADD ON     Status: Abnormal   Collection Time    10/01/13  6:29 PM      Result Value Ref Range   Squamous Epithelial / LPF MANY (*) RARE   WBC, UA 21-50  <3 WBC/hpf   RBC / HPF 3-6  <3 RBC/hpf   Bacteria, UA MANY (*) RARE   Urine-Other AMORPHOUS URATES/PHOSPHATES    CBC     Status: None   Collection Time    10/01/13  6:40 PM      Result Value Ref Range   WBC 7.2  4.0 - 10.5 K/uL   RBC 4.50   3.87 - 5.11 MIL/uL   Hemoglobin 13.3  12.0 - 15.0 g/dL   HCT 40.9  81.1 - 91.4 %   MCV 88.9  78.0 - 100.0 fL   MCH 29.6  26.0 - 34.0 pg   MCHC 33.3  30.0 - 36.0 g/dL   RDW 78.2  95.6 - 21.3 %   Platelets 246  150 - 400 K/uL    ASSESSMENT 1. UTI (lower urinary tract infection)   2. Low back pain     PLAN Toradol 60 mg IM with moderate reduction in pain Discharge home Ibuprofen 600 mg Q 6 hours Continue Bactrim DS as prescribed Return to MAU if symptoms persist or worsen Recommend primary care provider for pt--resources given    Medication List         albuterol 108 (90 BASE) MCG/ACT inhaler  Commonly known as:  PROVENTIL HFA;VENTOLIN HFA  Inhale 1 puff into the lungs every 6 (six) hours as needed for wheezing or shortness of breath.     ibuprofen 600 MG tablet  Commonly known as:  ADVIL,MOTRIN  Take 1 tablet (600 mg total) by mouth every 6 (six) hours as needed.     sulfamethoxazole-trimethoprim 800-160 MG per tablet  Commonly known as:  BACTRIM DS,SEPTRA DS  Take 1 tablet by mouth 2 (two) times daily.       Follow-up Information   Follow up with [facility] [place] [facility]. (As needed, If symptoms worsen)    Contact information:   [address] [id number] [place] [address] [phone]      Sharen Counter Certified Nurse-Midwife 10/01/2013  7:45 PM

## 2013-10-02 ENCOUNTER — Inpatient Hospital Stay (HOSPITAL_COMMUNITY)
Admission: AD | Admit: 2013-10-02 | Discharge: 2013-10-02 | Disposition: A | Discharge status: Home / Self Care | Payer: Medicare Other | Source: Ambulatory Visit | Attending: Obstetrics and Gynecology | Admitting: Obstetrics and Gynecology

## 2013-10-02 ENCOUNTER — Encounter (HOSPITAL_COMMUNITY): Payer: Self-pay | Admitting: *Deleted

## 2013-10-02 DIAGNOSIS — J4489 Other specified chronic obstructive pulmonary disease: Secondary | ICD-10-CM | POA: Insufficient documentation

## 2013-10-02 DIAGNOSIS — J449 Chronic obstructive pulmonary disease, unspecified: Secondary | ICD-10-CM | POA: Diagnosis not present

## 2013-10-02 DIAGNOSIS — F329 Major depressive disorder, single episode, unspecified: Secondary | ICD-10-CM | POA: Diagnosis not present

## 2013-10-02 DIAGNOSIS — F3289 Other specified depressive episodes: Secondary | ICD-10-CM | POA: Insufficient documentation

## 2013-10-02 DIAGNOSIS — R569 Unspecified convulsions: Secondary | ICD-10-CM | POA: Diagnosis not present

## 2013-10-02 DIAGNOSIS — R51 Headache: Secondary | ICD-10-CM | POA: Diagnosis not present

## 2013-10-02 DIAGNOSIS — F411 Generalized anxiety disorder: Secondary | ICD-10-CM | POA: Diagnosis not present

## 2013-10-02 DIAGNOSIS — F17200 Nicotine dependence, unspecified, uncomplicated: Secondary | ICD-10-CM | POA: Diagnosis not present

## 2013-10-02 DIAGNOSIS — R519 Headache, unspecified: Secondary | ICD-10-CM

## 2013-10-02 HISTORY — DX: Unspecified infectious disease: B99.9

## 2013-10-02 HISTORY — DX: Unspecified convulsions: R56.9

## 2013-10-02 LAB — GC/CHLAMYDIA PROBE AMP
CT PROBE, AMP APTIMA: NEGATIVE
GC PROBE AMP APTIMA: NEGATIVE

## 2013-10-02 MED ORDER — BUTALBITAL-APAP-CAFFEINE 50-325-40 MG PO TABS
2.0000 | ORAL_TABLET | Freq: Once | ORAL | Status: AC
  Administered 2013-10-02: 2 via ORAL
  Filled 2013-10-02: qty 2

## 2013-10-02 MED ORDER — BUTALBITAL-APAP-CAFFEINE 50-325-40 MG PO TABS: 1.0000 | ORAL_TABLET | Freq: Four times a day (QID) | ORAL | Status: DC | PRN

## 2013-10-02 NOTE — MAU Note (Signed)
Had a seizure last night, and again about 1330.  Denies any hx of seizure disorder, is not pregnant or post partem

## 2013-10-02 NOTE — MAU Note (Signed)
Pt's s.o. Before the seizure had severe back pain on left side.  Was in bed when occurred.  Pt is alert, able to carry on conversation, gait steady.

## 2013-10-02 NOTE — MAU Note (Signed)
Now says she has been having them monthly- they happen monthly.  Had an MRI- in [place]- but has not [facility] follow [facility].

## 2013-10-02 NOTE — MAU Provider Note (Signed)
History     CSN: [id number]  Arrival date and time: 10/02/13 1357   None     Chief Complaint  Patient presents with  . Seizures   HPI Pt is not pregnant and had a seizure last night and this morning.  Pt has a diffuse headache after seizure on the top of her Head  Pt denies blurred vision or loss of vision.Pt usually takes Ibuprofen - took yesterday and today; pt states that it does not work.   Pt is scheduled to see [facility] for further evaluation [facility]; pt had MRI and has not had results given to them- unclear if pt is having seizure or another type of episode. Pt denies los of urine or bowels- pt bit tongue and had bleeding initially but none now Pt and partner aware that being [facility] that we do not treat seizures unless pt it pregnant. RN note: d  MAU Note Service date: 10/02/2013 2:18 PM   Now says she has been having them monthly- they happen monthly. Had an MRI- in [place]- but has not [facility] Dr at [facility] for follow up.   Registered Nurse Signed MAU Note Service date: 10/02/2013 2:14 PM   Had a seizure last night, and again about 1330. Denies any hx of seizure disorder, is not pregnant or post partem     Past Medical History  Diagnosis Date  . Asthma   . Anxiety   . Depression   . Anxiety   . Depression   . COPD (chronic obstructive pulmonary disease)     No past surgical history on file.  Family History  Problem Relation Age of Onset  . Drug abuse Father   . Drug abuse Sister   . Drug abuse Brother     History  Substance Use Topics  . Smoking status: Current Every Day Smoker -- 0.25 packs/day for 10 years    Types: Cigarettes  . Smokeless tobacco: Former NeurosurgeonUser     Comment: she is wanting to quit trying  . Alcohol Use: 2.0 oz/week    4 drink(s) per week     Comment: 4 drinks    Allergies:  Allergies  Allergen Reactions  . Toradol [Ketorolac Tromethamine] Itching    seizure  . Flexeril [Cyclobenzaprine] Other (See  Comments)    seizure  . Gabapentin Other (See Comments)    seizures   . Tramadol Itching    Seizures  . Vicodin [Hydrocodone-Acetaminophen] Itching    Pt states she can take Tylenol without difficulty.  . Adhesive [Tape] Rash  . Keflet [Cephalexin] Rash  . Latex Rash  . Levaquin [Levofloxacin In D5w] Rash    Prescriptions prior to admission  Medication Sig Dispense Refill  . albuterol (PROVENTIL HFA;VENTOLIN HFA) 108 (90 BASE) MCG/ACT inhaler Inhale 1 puff into the lungs every 6 (six) hours as needed for wheezing or shortness of breath.      Marland Kitchen. ibuprofen (ADVIL,MOTRIN) 600 MG tablet Take 1 tablet (600 mg total) by mouth every 6 (six) hours as needed.  30 tablet  1  . sulfamethoxazole-trimethoprim (BACTRIM DS,SEPTRA DS) 800-160 MG per tablet Take 1 tablet by mouth 2 (two) times daily.  10 tablet  0    Review of Systems  Constitutional: Negative for fever and chills.  Gastrointestinal: Negative for nausea and vomiting.  Neurological: Positive for seizures and headaches. Negative for focal weakness.   Physical Exam   Blood pressure 120/79, pulse 108, temperature 98.5 F (36.9 C),  temperature source Oral, resp. rate 18, height 4' 11.5" (1.511 m), weight 71.215 kg (157 lb), last menstrual period 08/27/2013.  Physical Exam  Nursing note and vitals reviewed. Constitutional: She appears well-developed and well-nourished. No distress.  HENT:  Head: Normocephalic.  Eyes: Pupils are equal, round, and reactive to light.  Neck: Normal range of motion. Neck supple.  Cardiovascular: Normal rate.   Respiratory: Effort normal.  Musculoskeletal: Normal range of motion.  Neurological: She is alert.  Skin: Skin is warm and dry.  Psychiatric: She has a normal mood and affect.    MAU Course  Procedures Discussed with pt and partner that since pt was stable and not having a seizure, that we could transfer pt To either  or [facility] ED if they desire We can give pt med for  headache Pt to f/u with [facility] as scheduled- can call to see if sooner appointment available  Fioricet 2 tabs given  Assessment and Plan  Headache ?seizure F/u with [facility] ED if return of sx before appointment with [facility]  Kaiyana Bedore 10/02/2013, 2:28 PM

## 2013-10-03 LAB — URINE CULTURE: Special Requests: NORMAL

## 2013-10-03 NOTE — MAU Provider Note (Signed)
Attestation of Attending Supervision of Advanced Practitioner (CNM/NP): Evaluation and management procedures were performed by the Advanced Practitioner under my supervision and collaboration.  I have reviewed the Advanced Practitioner's note and chart, and I agree with the management and plan.  Kalla Watson 10/03/2013 12:12 PM   

## 2013-10-05 ENCOUNTER — Telehealth: Payer: Self-pay | Admitting: Obstetrics and Gynecology

## 2013-10-05 NOTE — Telephone Encounter (Signed)
Left message with patient; pt was given bactrim on 3/9, culture came back resistant. Two attempts to call the patient made. Pt needs Macrobid

## 2013-10-06 ENCOUNTER — Other Ambulatory Visit: Payer: Self-pay | Admitting: Advanced Practice Midwife

## 2013-10-06 MED ORDER — NITROFURANTOIN MONOHYD MACRO 100 MG PO CAPS: 100.0000 mg | ORAL_CAPSULE | Freq: Two times a day (BID) | ORAL | Status: DC

## 2013-10-06 NOTE — Progress Notes (Signed)
Urine culture shows resistance to Bactrim, which was originally prescribed on 10/01/13.  Macrobid 100 mg BID x7 days sent to pt pharmacy.  Phone calls made but pt has not yet returned calls regarding results or Rx.

## 2013-10-08 ENCOUNTER — Telehealth: Payer: Self-pay

## 2013-10-08 NOTE — Telephone Encounter (Signed)
Called pt. Left message stating we are calling about a new medication that has been sent to your pharmacy to treat your UTI, it is important to take this new medication to treat infection as the first ABX will not treat it, please call clinic with any questions or concerns. Will also send letter with information.

## 2013-10-08 NOTE — Telephone Encounter (Signed)
Message copied by Louanna RawAMPBELL, Keali Mccraw M on Mon Oct 08, 2013 11:34 AM ------      Message from: Sharen CounterLEFTWICH-KIRBY, LISA A      Created: Sat Oct 06, 2013  2:54 AM       This is nonpregnant pt seen in MAU.  She was given Bactrim to treat UTI on 10/01/13 but culture came back resistant to Bactrim.  I am sending Macrobid BID x7 days to her pharmacy.  Attempts made to call pt but no returned call.  Please make one more attempt to call pt to give results and let her know about Rx.  Thank you. ------

## 2013-10-14 ENCOUNTER — Encounter (HOSPITAL_COMMUNITY): Payer: Self-pay

## 2013-10-14 ENCOUNTER — Inpatient Hospital Stay (HOSPITAL_COMMUNITY)
Admission: AD | Admit: 2013-10-14 | Discharge: 2013-10-14 | Disposition: A | Discharge status: Home / Self Care | Payer: Medicare Other | Source: Ambulatory Visit | Attending: Obstetrics and Gynecology | Admitting: Obstetrics and Gynecology

## 2013-10-14 DIAGNOSIS — W261XXA Contact with sword or dagger, initial encounter: Secondary | ICD-10-CM

## 2013-10-14 DIAGNOSIS — W260XXA Contact with knife, initial encounter: Secondary | ICD-10-CM | POA: Insufficient documentation

## 2013-10-14 DIAGNOSIS — S51809A Unspecified open wound of unspecified forearm, initial encounter: Secondary | ICD-10-CM

## 2013-10-14 DIAGNOSIS — Y92009 Unspecified place in unspecified non-institutional (private) residence as the place of occurrence of the external cause: Secondary | ICD-10-CM | POA: Insufficient documentation

## 2013-10-14 DIAGNOSIS — Y93E9 Activity, other interior property and clothing maintenance: Secondary | ICD-10-CM | POA: Insufficient documentation

## 2013-10-14 LAB — POCT PREGNANCY, URINE: PREG TEST UR: NEGATIVE

## 2013-10-14 MED ORDER — HYDROCODONE-ACETAMINOPHEN 5-325 MG PO TABS
2.0000 | ORAL_TABLET | Freq: Once | ORAL | Status: AC
  Administered 2013-10-14: 2 via ORAL
  Filled 2013-10-14: qty 2

## 2013-10-14 MED ORDER — TETANUS-DIPHTH-ACELL PERTUSSIS 5-2.5-18.5 LF-MCG/0.5 IM SUSP
0.5000 mL | Freq: Once | INTRAMUSCULAR | Status: AC
  Administered 2013-10-14: 0.5 mL via INTRAMUSCULAR
  Filled 2013-10-14: qty 0.5

## 2013-10-14 NOTE — Discharge Instructions (Signed)
Wound Care Wound care helps prevent pain and infection.  You may need a tetanus shot if:  You cannot remember when you had your last tetanus shot.  You have never had a tetanus shot.  The injury broke your skin. If you need a tetanus shot and you choose not to have one, you may get tetanus. Sickness from tetanus can be serious. HOME CARE   Only take medicine as told by your doctor.  Clean the wound daily with mild soap and water.  Change any bandages (dressings) as told by your doctor.  Put medicated cream and a bandage on the wound as told by your doctor.  Change the bandage if it gets wet, dirty, or starts to smell.  Take showers. Do not take baths, swim, or do anything that puts your wound under water.  Rest and raise (elevate) the wound until the pain and puffiness (swelling) are better.  Keep all doctor visits as told. GET HELP RIGHT AWAY IF:   Yellowish-white fluid (pus) comes from the wound.  Medicine does not lessen your pain.  There is a red streak going away from the wound.  You have a fever. MAKE SURE YOU:   Understand these instructions.  Will watch your condition.  Will get help right away if you are not doing well or get worse. Document Released: 04/20/2008 Document Revised: 10/04/2011 Document Reviewed: 11/15/2010 Exit[employer] Patient Information 2014 [employer].  

## 2013-10-14 NOTE — MAU Note (Signed)
Pt states she cut left arm while washing dishes with a knife. Wrapped with ace bandage at home.

## 2013-10-14 NOTE — MAU Provider Note (Signed)
`````  Attestation of Attending Supervision of Advanced Practitioner: Evaluation and management procedures were performed by the PA/NP/CNM/OB Fellow under my supervision/collaboration. Chart reviewed and agree with management and plan.  Yeray Tomas V 10/14/2013 11:30 PM

## 2013-10-14 NOTE — MAU Note (Signed)
Pt states that she can take vicodin and that it only makes her itch sometimes. Verified with Sue LushAndrea from pharmacy.

## 2013-10-14 NOTE — MAU Provider Note (Signed)
History     CSN: [id number]  Arrival date and time: 10/14/13 2052   First Provider Initiated Contact with Patient 10/14/13 2109      Chief Complaint  Patient presents with  . Puncture Wound   HPI  Jacqueline Jones is a 23 y.o. G0P0 who presents today after cutting her arm while washing the dishes. She is unsure of when she had her last tetanus shot. Her boyfriend states that he cleaned it at home and put neosporin on it. They also applied a pressure dressing.   Past Medical History  Diagnosis Date  . Asthma   . Anxiety   . Depression   . Anxiety   . Depression   . COPD (chronic obstructive pulmonary disease)   . Seizures     unknown cause- started 08/14  "monthly"  . Infection     UTI    History reviewed. No pertinent past surgical history.  Family History  Problem Relation Age of Onset  . Drug abuse Father   . Drug abuse Sister   . Drug abuse Brother     History  Substance Use Topics  . Smoking status: Current Every Day Smoker -- 0.25 packs/day for 10 years    Types: Cigarettes  . Smokeless tobacco: Former Neurosurgeon     Comment: she is wanting to quit trying  . Alcohol Use: 2.0 oz/week    4 drink(s) per week     Comment: 4 drinks    Allergies:  Allergies  Allergen Reactions  . Toradol [Ketorolac Tromethamine] Itching    seizure  . Flexeril [Cyclobenzaprine] Other (See Comments)    seizure  . Gabapentin Other (See Comments)    seizures   . Tramadol Itching    Seizures  . Vicodin [Hydrocodone-Acetaminophen] Itching    Pt states she can take Tylenol without difficulty.  . Adhesive [Tape] Rash  . Keflet [Cephalexin] Rash  . Latex Rash  . Levaquin [Levofloxacin In D5w] Rash    Prescriptions prior to admission  Medication Sig Dispense Refill  . albuterol (PROVENTIL HFA;VENTOLIN HFA) 108 (90 BASE) MCG/ACT inhaler Inhale 1 puff into the lungs every 6 (six) hours as needed for wheezing or shortness of breath.      . ALPRAZolam (XANAX) 1 MG tablet Take  1 mg by mouth daily as needed for anxiety.      . butalbital-acetaminophen-caffeine (FIORICET) 50-325-40 MG per tablet Take 1-2 tablets by mouth every 6 (six) hours as needed for headache.  20 tablet  0  . ibuprofen (ADVIL,MOTRIN) 600 MG tablet Take 600 mg by mouth every 6 (six) hours as needed for headache or moderate pain.      . nitrofurantoin, macrocrystal-monohydrate, (MACROBID) 100 MG capsule Take 1 capsule (100 mg total) by mouth 2 (two) times daily.  14 capsule  0    ROS Physical Exam   Blood pressure 112/73, pulse 120, temperature 98.2 F (36.8 C), temperature source Oral, resp. rate 20, last menstrual period 08/27/2013, SpO2 98.00%.  Physical Exam  Nursing note and vitals reviewed. Constitutional: She is oriented to person, place, and time. She appears well-developed and well-nourished. No distress.  Cardiovascular: Normal rate.   Respiratory: Effort normal.  GI: Soft.  Neurological: She is alert and oriented to person, place, and time.  Skin: Skin is warm and dry.   2cm long laceration to the left forearm. Not bleeding, and about 1mm deep. Area cleansed and then closed with 3 steri-strips. 2x2 folded guaze applied to the laceration.  Psychiatric: She has a normal mood and affect.    MAU Course  Procedures  Wound cleaned and dressed with 3 steri-strips Tdap given 2/2 unknown hx of when she last had a tetanus booster Instructions given on care of the area.  Assessment and Plan   1. Wound, open, arm, forearm    Infection precautions After care reviewed with the patient [facility] if any s/s of infection develop Tetanus booster given here  Follow-up Information   Follow up with [facility]. (If symptoms worsen)    Specialty:  Emergency Medicine   Contact information:   [address] [id number] [address] [place] [place] [id number] [phone]       Tawnya CrookHogan, Yesena Reaves Donovan 10/14/2013, 9:32 PM

## 2013-10-15 ENCOUNTER — Encounter: Payer: Self-pay | Admitting: Neurology

## 2013-10-15 ENCOUNTER — Encounter (HOSPITAL_COMMUNITY): Payer: Self-pay | Admitting: Emergency Medicine

## 2013-10-15 ENCOUNTER — Emergency Department (HOSPITAL_COMMUNITY)
Admission: EM | Admit: 2013-10-15 | Discharge: 2013-10-16 | Disposition: A | Discharge status: Home / Self Care | Payer: Medicare Other | Attending: Emergency Medicine | Admitting: Emergency Medicine

## 2013-10-15 DIAGNOSIS — Z79899 Other long term (current) drug therapy: Secondary | ICD-10-CM | POA: Diagnosis not present

## 2013-10-15 DIAGNOSIS — J4489 Other specified chronic obstructive pulmonary disease: Secondary | ICD-10-CM | POA: Insufficient documentation

## 2013-10-15 DIAGNOSIS — F3289 Other specified depressive episodes: Secondary | ICD-10-CM | POA: Diagnosis not present

## 2013-10-15 DIAGNOSIS — Y93G1 Activity, food preparation and clean up: Secondary | ICD-10-CM | POA: Insufficient documentation

## 2013-10-15 DIAGNOSIS — F411 Generalized anxiety disorder: Secondary | ICD-10-CM | POA: Insufficient documentation

## 2013-10-15 DIAGNOSIS — Z87891 Personal history of nicotine dependence: Secondary | ICD-10-CM | POA: Insufficient documentation

## 2013-10-15 DIAGNOSIS — S51809A Unspecified open wound of unspecified forearm, initial encounter: Secondary | ICD-10-CM | POA: Diagnosis not present

## 2013-10-15 DIAGNOSIS — J449 Chronic obstructive pulmonary disease, unspecified: Secondary | ICD-10-CM | POA: Diagnosis not present

## 2013-10-15 DIAGNOSIS — Y838 Other surgical procedures as the cause of abnormal reaction of the patient, or of later complication, without mention of misadventure at the time of the procedure: Secondary | ICD-10-CM | POA: Insufficient documentation

## 2013-10-15 DIAGNOSIS — Z87440 Personal history of urinary (tract) infections: Secondary | ICD-10-CM | POA: Diagnosis not present

## 2013-10-15 DIAGNOSIS — F329 Major depressive disorder, single episode, unspecified: Secondary | ICD-10-CM | POA: Diagnosis not present

## 2013-10-15 DIAGNOSIS — Z792 Long term (current) use of antibiotics: Secondary | ICD-10-CM | POA: Insufficient documentation

## 2013-10-15 DIAGNOSIS — Z8669 Personal history of other diseases of the nervous system and sense organs: Secondary | ICD-10-CM | POA: Insufficient documentation

## 2013-10-15 DIAGNOSIS — S51819A Laceration without foreign body of unspecified forearm, initial encounter: Secondary | ICD-10-CM

## 2013-10-15 DIAGNOSIS — Y929 Unspecified place or not applicable: Secondary | ICD-10-CM | POA: Insufficient documentation

## 2013-10-15 DIAGNOSIS — Z91040 Latex allergy status: Secondary | ICD-10-CM | POA: Diagnosis not present

## 2013-10-15 DIAGNOSIS — W260XXA Contact with knife, initial encounter: Secondary | ICD-10-CM | POA: Insufficient documentation

## 2013-10-15 DIAGNOSIS — W261XXA Contact with sword or dagger, initial encounter: Secondary | ICD-10-CM

## 2013-10-15 NOTE — ED Provider Notes (Signed)
CSN: [id number]     Arrival date & time 10/15/13  2111 History  This chart was scribed for non-physician practitioner, Ivonne AndrewPeter Jaia Alonge, PA-C working with Brandt LoosenJulie Manly, MD by Luisa DagoPriscilla Tutu, ED scribe. This patient was seen in room WTR8/WTR8 and the patient's care was started at 11:43 PM.    Chief Complaint  Patient presents with  . Extremity Laceration    The history is provided by the patient. No language interpreter was used.   HPI Comments: Jacqueline Jones is a 23 y.o. female who presents to the Emergency Department complaining of a laceration to her left forearm that occurred 1 day ago. Pt states that she was doing the dishes when she cut herself. She [facility]. Pt states that as she was cleaning the injured area and her wound began to bleed. She states that her associated pain has worsened. Pt states that her last tet[facility]. She denies any fever, chills, diaphoresis, nausea, or emesis.     Past Medical History  Diagnosis Date  . Asthma   . Anxiety   . Depression   . Anxiety   . Depression   . COPD (chronic obstructive pulmonary disease)   . Seizures     unknown cause- started 08/14  "monthly"  . Infection     UTI   History reviewed. No pertinent past surgical history. Family History  Problem Relation Age of Onset  . Drug abuse Father   . Drug abuse Sister   . Drug abuse Brother    History  Substance Use Topics  . Smoking status: Former Smoker -- 0.25 packs/day for 10 years    Types: Cigarettes  . Smokeless tobacco: Former NeurosurgeonUser     Comment: she is wanting to quit trying  . Alcohol Use: No     Comment: former   OB History   Grav Para Term Preterm Abortions TAB SAB Ect Mult Living   0 0             Review of Systems  Constitutional: Negative for fever, chills and diaphoresis.  Gastrointestinal: Negative for nausea and vomiting.  Skin:  Positive for wound (laceration to left forearm).  All other systems reviewed and are negative.      Allergies  Toradol; Flexeril; Gabapentin; Tramadol; Vicodin; Adhesive; Keflet; Latex; and Levaquin  Home Medications   Current Outpatient Rx  Name  Route  Sig  Dispense  Refill  . albuterol (PROVENTIL HFA;VENTOLIN HFA) 108 (90 BASE) MCG/ACT inhaler   Inhalation   Inhale 1 puff into the lungs every 6 (six) hours as needed for wheezing or shortness of breath.         . ALPRAZolam (XANAX) 1 MG tablet   Oral   Take 1 mg by mouth daily as needed for anxiety.         . butalbital-acetaminophen-caffeine (FIORICET) 50-325-40 MG per tablet   Oral   Take 1-2 tablets by mouth every 6 (six) hours as needed for headache.   20 tablet   0   . ibuprofen (ADVIL,MOTRIN) 600 MG tablet   Oral   Take 600 mg by mouth every 6 (six) hours as needed for headache or moderate pain.         . nitrofurantoin, macrocrystal-monohydrate, (MACROBID) 100 MG capsule   Oral   Take 1 capsule (100 mg total) by mouth 2 (two) times daily.   14  capsule   0    BP 115/79  Pulse 78  Temp(Src) 98 F (36.7 C) (Oral)  Resp 15  Ht 5' (1.524 m)  Wt 156 lb 8 oz (70.988 kg)  BMI 30.56 kg/m2  SpO2 97%  LMP 08/27/2013  Physical Exam  Nursing note and vitals reviewed. Constitutional: She is oriented to person, place, and time. She appears well-developed and well-nourished.  HENT:  Head: Normocephalic and atraumatic.  Cardiovascular: Normal rate.   Pulmonary/Chest: Effort normal.  Abdominal: She exhibits no distension.  Musculoskeletal: Normal range of motion.  Laceration to the left anterior forearm.  No active bleeding.  No significant erythema of skin.  No streaks.  No FB present.  Neurological: She is alert and oriented to person, place, and time.  Skin: Skin is warm and dry.  Psychiatric: She has a normal mood and affect.    ED Course  Procedures   DIAGNOSTIC STUDIES: Oxygen Saturation is 97%  on RA, adequate by my interpretation.    COORDINATION OF CARE: 11:11 PM- Pt advised of plan for treatment and pt agrees.    LACERATION REPAIR Performed by: Angus Seller Authorized by: Angus Seller Consent: Verbal consent obtained. Risks and benefits: risks, benefits and alternatives were discussed Consent given by: patient Patient identity confirmed: provided demographic data Prepped and Draped in normal sterile fashion Wound explored  Laceration Location: left anterior forearm  Laceration Length: 2.5cm  No Foreign Bodies seen or palpated  Anesthesia: local infiltration  Local anesthetic: lidocaine 2% without epinephrine  Anesthetic total: 3ml  Irrigation method: syringe Amount of cleaning: standard  Skin closure: Skin with 4-0 prolene  Number of sutures: 3  Technique: Simple interrupted  Patient tolerance: Patient tolerated the procedure well with no immediate complications.     MDM   Final diagnoses:  Laceration of forearm    I personally performed the services described in this documentation, which was scribed in my presence. The recorded information has been reviewed and is accurate.    Angus Seller, PA-C 10/16/13 2148

## 2013-10-15 NOTE — ED Notes (Signed)
Pt states the incident happened yesterday  [facility]  Pt states when she cleaned it today it started bleeding  Pt states the pain is worse today

## 2013-10-15 NOTE — ED Notes (Signed)
Pt states she was doing dishes and cut her left forearm with a steak knife  Bleeding controlled at this time

## 2013-10-16 MED ORDER — CLINDAMYCIN HCL 300 MG PO CAPS: 300.0000 mg | ORAL_CAPSULE | Freq: Four times a day (QID) | ORAL | Status: DC

## 2013-10-16 NOTE — Discharge Instructions (Signed)
Keep your wound clean and dry. Followup with your primary care provider in 7 days to have your sutures removed. Keep the arm elevated. Use ice to help reduce the pain and swelling.    Laceration Care, Adult A laceration is a cut that goes through all layers of the skin. The cut goes into the tissue beneath the skin. HOME CARE For stitches (sutures) or staples:  Keep the cut clean and dry.  If you have a bandage (dressing), change it at least once a day. Change the bandage if it gets wet or dirty, or as told by your doctor.  Wash the cut with soap and water 2 times a day. Rinse the cut with water. Pat it dry with a clean towel.  Put a thin layer of medicated cream on the cut as told by your doctor.  You may shower after the first 24 hours. Do not soak the cut in water until the stitches are removed.  Only take medicines as told by your doctor.  Have your stitches or staples removed as told by your doctor. For skin adhesive strips:  Keep the cut clean and dry.  Do not get the strips wet. You may take a bath, but be careful to keep the cut dry.  If the cut gets wet, pat it dry with a clean towel.  The strips will fall off on their own. Do not remove the strips that are still stuck to the cut. For wound glue:  You may shower or take baths. Do not soak or scrub the cut. Do not swim. Avoid heavy sweating until the glue falls off on its own. After a shower or bath, pat the cut dry with a clean towel.  Do not put medicine on your cut until the glue falls off.  If you have a bandage, do not put tape over the glue.  Avoid lots of sunlight or tanning lamps until the glue falls off. Put sunscreen on the cut for the first year to reduce your scar.  The glue will fall off on its own. Do not pick at the glue. You may need a tetanus shot if:  You cannot remember when you had your last tetanus shot.  You have never had a tetanus shot. If you need a tetanus shot and you choose not to  have one, you may get tetanus. Sickness from tetanus can be serious. GET HELP RIGHT AWAY IF:   Your pain does not get better with medicine.  Your arm, hand, leg, or foot loses feeling (numbness) or changes color.  Your cut is bleeding.  Your joint feels weak, or you cannot use your joint.  You have painful lumps on your body.  Your cut is red, puffy (swollen), or painful.  You have a red line on the skin near the cut.  You have yellowish-white fluid (pus) coming from the cut.  You have a fever.  You have a bad smell coming from the cut or bandage.  Your cut breaks open before or after stitches are removed.  You notice something coming out of the cut, such as wood or glass.  You cannot move a finger or toe. MAKE SURE YOU:   Understand these instructions.  Will watch your condition.  Will get help right away if you are not doing well or get worse. Document Released: 12/29/2007 Document Revised: 10/04/2011 Document Reviewed: 01/05/2011 [facility] Patient Information 2014 [place], [place].

## 2013-10-17 NOTE — ED Provider Notes (Signed)
Medical screening examination/treatment/procedure(s) were performed by non-physician practitioner and as supervising physician I was immediately available for consultation/collaboration.   EKG Interpretation None        Brandt LoosenJulie Roderic Lammert, MD 10/17/13 [phone]

## 2013-10-18 ENCOUNTER — Encounter: Payer: Self-pay | Admitting: Physician Assistant

## 2013-10-18 ENCOUNTER — Ambulatory Visit (INDEPENDENT_AMBULATORY_CARE_PROVIDER_SITE_OTHER): Payer: Medicare Other | Admitting: Physician Assistant

## 2013-10-18 VITALS — BP 108/80 | HR 80 | Temp 98.0°F | Ht 64.0 in | Wt 155.2 lb

## 2013-10-18 DIAGNOSIS — M549 Dorsalgia, unspecified: Secondary | ICD-10-CM

## 2013-10-18 DIAGNOSIS — Z0000 Encounter for general adult medical examination without abnormal findings: Secondary | ICD-10-CM

## 2013-10-18 DIAGNOSIS — Z87442 Personal history of urinary calculi: Secondary | ICD-10-CM

## 2013-10-18 DIAGNOSIS — G40909 Epilepsy, unspecified, not intractable, without status epilepticus: Secondary | ICD-10-CM

## 2013-10-18 DIAGNOSIS — F329 Major depressive disorder, single episode, unspecified: Secondary | ICD-10-CM

## 2013-10-18 DIAGNOSIS — K219 Gastro-esophageal reflux disease without esophagitis: Secondary | ICD-10-CM

## 2013-10-18 DIAGNOSIS — F411 Generalized anxiety disorder: Secondary | ICD-10-CM | POA: Diagnosis not present

## 2013-10-18 DIAGNOSIS — F419 Anxiety disorder, unspecified: Secondary | ICD-10-CM

## 2013-10-18 DIAGNOSIS — F3289 Other specified depressive episodes: Secondary | ICD-10-CM | POA: Diagnosis not present

## 2013-10-18 DIAGNOSIS — F319 Bipolar disorder, unspecified: Secondary | ICD-10-CM

## 2013-10-18 DIAGNOSIS — J45909 Unspecified asthma, uncomplicated: Secondary | ICD-10-CM | POA: Diagnosis not present

## 2013-10-18 DIAGNOSIS — G43909 Migraine, unspecified, not intractable, without status migrainosus: Secondary | ICD-10-CM

## 2013-10-18 DIAGNOSIS — F32A Depression, unspecified: Secondary | ICD-10-CM

## 2013-10-18 DIAGNOSIS — J42 Unspecified chronic bronchitis: Secondary | ICD-10-CM

## 2013-10-18 NOTE — Progress Notes (Signed)
Patient ID: Jacqueline Jones is a 23 y.o. female DOB: [phone] MRN: [id number]     HPI:  Patient is a 23 year old female who present to the office to establish care. Patients Jacqueline Jones is present in room.  Reports an extensive medical history to include asthma which she states is well controlled, has an albuterol inhaler and only has to use once a month or less. History of arthritis of non-dominant left wrist that does not require medication. History of depression, anxiety and Bipolar is not on medications currently. Use to be treated with Prozac states she is crying a lot and is very anxious. History of chronic bronchitis not bothering her in a long time. Migraines uses Ibuprofen with good relief. History of GERD treated with Omeprazole. History of kidney stones has not had one in the last 6 months. Reports has a seizure disorder and Migraines has recently been evaluated by a neurologist in town, "Jacqueline Jones" does not know providers last name. States she has a copy of her MRI on disc and needs a referral to neurology so she can schedule follow up. States she has seizures monthly.  Also history of mid back pain with intermittent spasms, reports she has not been evaluated for this pain, has had no xray of the back. Recently quit smoking in last couple months.  Denies chest pain/palpitations, SOB cough, extremity swelling, N/V/F/C, change in bowel/bladder habits, lightheaded, dizziness, recent headaches, abdominal pain or cramping, blood in urine or stool, change in vision or visual disturbance, numbness, tingling or weakness. Recent laceration to left forearm while doing dishes. Suture repair at ED on 10/14/13.  Influenza: no Tetanus: 10/14/13 PAP: unknown if ever LMP: 09/25/13 Eye Dr. Bonnetta Jones  Dentist no   ROS: As stated in HPI. All other systems negative  Past Medical History  Diagnosis Date  . Asthma   . Anxiety   . Depression   . Anxiety   . Depression   . COPD (chronic obstructive  pulmonary disease)   . Seizures     unknown cause- started 08/14  "monthly"  . Infection     UTI  . Frequent headaches   . UTI (lower urinary tract infection)    Family History  Problem Relation Age of Onset  . Drug abuse Father   . Drug abuse Sister   . Drug abuse Brother    History   Social History  . Marital Status: Single    Spouse Name: N/A    Number of Children: N/A  . Years of Education: N/A   Social History Main Topics  . Smoking status: Former Smoker -- 0.25 packs/day for 10 years    Types: Cigarettes  . Smokeless tobacco: Former Neurosurgeon     Comment: she is wanting to quit trying  . Alcohol Use: 0.0 oz/week     Comment: former  . Drug Use: No     Comment: Marijuana  . Sexual Activity: No   Other Topics Concern  . None   Social History Narrative   ** Merged History Encounter **       Past Surgical History  Procedure Laterality Date  . Cholecystectomy    . Appendectomy     Current Outpatient Prescriptions on File Prior to Visit  Medication Sig Dispense Refill  . albuterol (PROVENTIL HFA;VENTOLIN HFA) 108 (90 BASE) MCG/ACT inhaler Inhale 1 puff into the lungs every 6 (six) hours as needed for wheezing or shortness of breath.       No  current facility-administered medications on file prior to visit.   Allergies  Allergen Reactions  . Toradol [Ketorolac Tromethamine] Itching    seizure  . Flexeril [Cyclobenzaprine] Other (See Comments)    seizure  . Gabapentin Other (See Comments)    seizures   . Tramadol Itching    Seizures  . Vicodin [Hydrocodone-Acetaminophen] Itching    Pt states she can take Tylenol without difficulty.  . Adhesive [Tape] Rash  . Keflet [Cephalexin] Rash  . Latex Rash  . Levaquin [Levofloxacin In D5w] Rash    PE:  Filed Vitals:   10/18/13 1044  BP: 108/80  Pulse: 80  Temp: 98 F (36.7 C)    CONSTITUTIONAL: Well developed, well nourished, pleasant, appears stated age, in NAD, does not appear anxious. HEENT:  normocephalic, atraumatic, bilateral ext/int canals normal. Bilateral TM's without injections, bulging, erythema. Nose normal, nasal mucosa without erythema or edema, uvula midline, oropharynx clear and moist. EYES: PERRLA, bilateral EOM and conjunctiva normal, no icterus NECK: FROM, supple, trachea midline, without thyromegaly or mass CARDIO: RRR, normal S1 and S2, distal pulses intact. No extremity edema PULM/CHEST CTA bilateral, no wheezes, rales or rhonchi. Non tender. ABD: abdomen obese, soft, nontender. No guarding, rebound tenderness, no HSM, no CVA tenderness.  Normal bowel sounds x 4 quadrants GU: deferred to GYN MUSC: FROM U/LE bilateral, FROM thoracic and lumbar spine. Diffuse tenderness to palpation of thoracic midline and paraspinal region just at bra line. Area is without erythema, ecchymosis, rash, mass, spasm or tactile heat. LYMPH: no cervical, supraclavicular adenopathy NEURO: alert and oriented x 3, no cranial nerve deficit, motor strength, extremity sensation and coordination NL. Able to ambulate on heel and toes without difficulty. Able to heel toe walk without difficulty. Negative romberg. Gait normal. SKIN: warm, dry, no rash or lesions noted. PSYCH: Mood and affect normal, speech normal, answers questions appropriately.    Lab Results  Component Value Date   WBC 7.2 10/01/2013   HGB 13.3 10/01/2013   HCT 40.0 10/01/2013   PLT 246 10/01/2013   GLUCOSE 100* 07/14/2013   CHOL 180 11/03/2011   TRIG 96 11/03/2011   HDL 49 11/03/2011   LDLCALC 112* 11/03/2011   ALT 36* 06/13/2013   AST 33 06/13/2013   NA 134* 07/14/2013   K 4.0 07/14/2013   CL 99 07/14/2013   CREATININE 0.63 07/14/2013   BUN 6 07/14/2013   CO2 25 07/14/2013   HGBA1C 5.1 11/03/2011     ASSESSMENT and PLAN   CPX/v70.0 - Patient has been counseled on age-appropriate routine health concerns for screening and prevention. These are reviewed and up-to-date. Immunizations are up-to-date or declined. Labs ordered  and will be reviewed.  Referral to GYN, psychiatry and neurology.  Asthma Continue with albuterol as needed  GERD Continue with Omeprazole 20 mg once daily  Depression/anxiety/bipolar No current medications. Referral to psychiatry  Seizure disorder/Migraines No current medications Referral to neurology  Back pain Xray of thoracic back today

## 2013-10-18 NOTE — Patient Instructions (Addendum)
It was great to meet you today Jacqueline Jones!   I have ordered and xray of your back, please report to the lower level to have this completed. I have also ordered lab work for you, please have this completed today also.  Referral have been placed as discussed, the patient care coordinator will phone to help set these up.  Health Maintenance, Female A healthy lifestyle and preventative care can promote health and wellness.  Maintain regular health, dental, and eye exams.  Eat a healthy diet. Foods like vegetables, fruits, whole grains, low-fat dairy products, and lean protein foods contain the nutrients you need without too many calories. Decrease your intake of foods high in solid fats, added sugars, and salt. Get information about a proper diet from your caregiver, if necessary.  Regular physical exercise is one of the most important things you can do for your health. Most adults should get at least 150 minutes of moderate-intensity exercise (any activity that increases your heart rate and causes you to sweat) each week. In addition, most adults need muscle-strengthening exercises on 2 or more days a week.   Maintain a healthy weight. The body mass index (BMI) is a screening tool to identify possible weight problems. It provides an estimate of body fat based on height and weight. Your caregiver can help determine your BMI, and can help you achieve or maintain a healthy weight. For adults 20 years and older:  A BMI below 18.5 is considered underweight.  A BMI of 18.5 to 24.9 is normal.  A BMI of 25 to 29.9 is considered overweight.  A BMI of 30 and above is considered obese.  Maintain normal blood lipids and cholesterol by exercising and minimizing your intake of saturated fat. Eat a balanced diet with plenty of fruits and vegetables. Blood tests for lipids and cholesterol should begin at age 63 and be repeated every 5 years. If your lipid or cholesterol levels are high, you are over 50, or  you are a high risk for heart disease, you may need your cholesterol levels checked more frequently.Ongoing high lipid and cholesterol levels should be treated with medicines if diet and exercise are not effective.  If you smoke, find out from your caregiver how to quit. If you do not use tobacco, do not start.  Lung cancer screening is recommended for adults aged 53 80 years who are at high risk for developing lung cancer because of a history of smoking. Yearly low-dose computed tomography (CT) is recommended for people who have at least a 30-pack-year history of smoking and are a current smoker or have quit within the past 15 years. A pack year of smoking is smoking an average of 1 pack of cigarettes a day for 1 year (for example: 1 pack a day for 30 years or 2 packs a day for 15 years). Yearly screening should continue until the smoker has stopped smoking for at least 15 years. Yearly screening should also be stopped for people who develop a health problem that would prevent them from having lung cancer treatment.  If you are pregnant, do not drink alcohol. If you are breastfeeding, be very cautious about drinking alcohol. If you are not pregnant and choose to drink alcohol, do not exceed 1 drink per day. One drink is considered to be 12 ounces (355 mL) of beer, 5 ounces (148 mL) of wine, or 1.5 ounces (44 mL) of liquor.  Avoid use of street drugs. Do not share needles with anyone. Ask  for help if you need support or instructions about stopping the use of drugs.  High blood pressure causes heart disease and increases the risk of stroke. Blood pressure should be checked at least every 1 to 2 years. Ongoing high blood pressure should be treated with medicines, if weight loss and exercise are not effective.  If you are 60 to 23 years old, ask your caregiver if you should take aspirin to prevent strokes.  Diabetes screening involves taking a blood sample to check your fasting blood sugar level. This  should be done once every 3 years, after age 60, if you are within normal weight and without risk factors for diabetes. Testing should be considered at a younger age or be carried out more frequently if you are overweight and have at least 1 risk factor for diabetes.  Breast cancer screening is essential preventative care for women. You should practice "breast self-awareness." This means understanding the normal appearance and feel of your breasts and may include breast self-examination. Any changes detected, no matter how small, should be reported to a caregiver. Women in their 1s and 30s should have a clinical breast exam (CBE) by a caregiver as part of a regular health exam every 1 to 3 years. After age 78, women should have a CBE every year. Starting at age 77, women should consider having a mammogram (breast X-ray) every year. Women who have a family history of breast cancer should talk to their caregiver about genetic screening. Women at a high risk of breast cancer should talk to their caregiver about having an MRI and a mammogram every year.  Breast cancer gene (BRCA)-related cancer risk assessment is recommended for women who have family members with BRCA-related cancers. BRCA-related cancers include breast, ovarian, tubal, and peritoneal cancers. Having family members with these cancers may be associated with an increased risk for harmful changes (mutations) in the breast cancer genes BRCA1 and BRCA2. Results of the assessment will determine the need for genetic counseling and BRCA1 and BRCA2 testing.  The Pap test is a screening test for cervical cancer. Women should have a Pap test starting at age 23. Between ages 54 and 83, Pap tests should be repeated every 2 years. Beginning at age 68, you should have a Pap test every 3 years as long as the past 3 Pap tests have been normal. If you had a hysterectomy for a problem that was not cancer or a condition that could lead to cancer, then you no longer  need Pap tests. If you are between ages 88 and 63, and you have had normal Pap tests going back 10 years, you no longer need Pap tests. If you have had past treatment for cervical cancer or a condition that could lead to cancer, you need Pap tests and screening for cancer for at least 20 years after your treatment. If Pap tests have been discontinued, risk factors (such as a new sexual partner) need to be reassessed to determine if screening should be resumed. Some women have medical problems that increase the chance of getting cervical cancer. In these cases, your caregiver may recommend more frequent screening and Pap tests.  The human papillomavirus (HPV) test is an additional test that may be used for cervical cancer screening. The HPV test looks for the virus that can cause the cell changes on the cervix. The cells collected during the Pap test can be tested for HPV. The HPV test could be used to screen women aged 47 years and  older, and should be used in women of any age who have unclear Pap test results. After the age of 44, women should have HPV testing at the same frequency as a Pap test.  Colorectal cancer can be detected and often prevented. Most routine colorectal cancer screening begins at the age of 48 and continues through age 74. However, your caregiver may recommend screening at an earlier age if you have risk factors for colon cancer. On a yearly basis, your caregiver may provide home test kits to check for hidden blood in the stool. Use of a small camera at the end of a tube, to directly examine the colon (sigmoidoscopy or colonoscopy), can detect the earliest forms of colorectal cancer. Talk to your caregiver about this at age 74, when routine screening begins. Direct examination of the colon should be repeated every 5 to 10 years through age 79, unless early forms of pre-cancerous polyps or small growths are found.  Hepatitis C blood testing is recommended for all people born from [birth year]  through 1965 and any individual with known risks for hepatitis C.  Practice safe sex. Use condoms and avoid high-risk sexual practices to reduce the spread of sexually transmitted infections (STIs). Sexually active women aged 49 and younger should be checked for Chlamydia, which is a common sexually transmitted infection. Older women with new or multiple partners should also be tested for Chlamydia. Testing for other STIs is recommended if you are sexually active and at increased risk.  Osteoporosis is a disease in which the bones lose minerals and strength with aging. This can result in serious bone fractures. The risk of osteoporosis can be identified using a bone density scan. Women ages 11 and over and women at risk for fractures or osteoporosis should discuss screening with their caregivers. Ask your caregiver whether you should be taking a calcium supplement or vitamin D to reduce the rate of osteoporosis.  Menopause can be associated with physical symptoms and risks. Hormone replacement therapy is available to decrease symptoms and risks. You should talk to your caregiver about whether hormone replacement therapy is right for you.  Use sunscreen. Apply sunscreen liberally and repeatedly throughout the day. You should seek shade when your shadow is shorter than you. Protect yourself by wearing long sleeves, pants, a wide-brimmed hat, and sunglasses year round, whenever you are outdoors.  Notify your caregiver of new moles or changes in moles, especially if there is a change in shape or color. Also notify your caregiver if a mole is larger than the size of a pencil eraser.  Stay current with your immunizations. Document Released: 01/25/2011 Document Revised: 11/06/2012 Document Reviewed: 01/25/2011 [facility] Patient Information 2014 [place] [employer].

## 2013-10-18 NOTE — Progress Notes (Signed)
Pre visit review using our clinic review tool, if applicable. No additional management support is needed unless otherwise documented below in the visit note. 

## 2013-11-08 ENCOUNTER — Inpatient Hospital Stay (HOSPITAL_COMMUNITY)
Admission: AD | Admit: 2013-11-08 | Discharge: 2013-11-08 | Disposition: A | Discharge status: Home / Self Care | Payer: Medicare Other | Source: Ambulatory Visit | Attending: Obstetrics & Gynecology | Admitting: Obstetrics & Gynecology

## 2013-11-08 ENCOUNTER — Encounter (HOSPITAL_COMMUNITY): Payer: Self-pay | Admitting: *Deleted

## 2013-11-08 DIAGNOSIS — R05 Cough: Secondary | ICD-10-CM | POA: Insufficient documentation

## 2013-11-08 DIAGNOSIS — J029 Acute pharyngitis, unspecified: Secondary | ICD-10-CM | POA: Diagnosis not present

## 2013-11-08 DIAGNOSIS — J449 Chronic obstructive pulmonary disease, unspecified: Secondary | ICD-10-CM | POA: Diagnosis not present

## 2013-11-08 DIAGNOSIS — J069 Acute upper respiratory infection, unspecified: Secondary | ICD-10-CM | POA: Diagnosis not present

## 2013-11-08 DIAGNOSIS — R509 Fever, unspecified: Secondary | ICD-10-CM | POA: Insufficient documentation

## 2013-11-08 DIAGNOSIS — N644 Mastodynia: Secondary | ICD-10-CM | POA: Diagnosis not present

## 2013-11-08 DIAGNOSIS — R059 Cough, unspecified: Secondary | ICD-10-CM | POA: Insufficient documentation

## 2013-11-08 DIAGNOSIS — G43909 Migraine, unspecified, not intractable, without status migrainosus: Secondary | ICD-10-CM

## 2013-11-08 DIAGNOSIS — J3489 Other specified disorders of nose and nasal sinuses: Secondary | ICD-10-CM | POA: Insufficient documentation

## 2013-11-08 DIAGNOSIS — J4489 Other specified chronic obstructive pulmonary disease: Secondary | ICD-10-CM | POA: Insufficient documentation

## 2013-11-08 LAB — URINALYSIS, ROUTINE W REFLEX MICROSCOPIC
Bilirubin Urine: NEGATIVE
GLUCOSE, UA: NEGATIVE mg/dL
Hgb urine dipstick: NEGATIVE
Ketones, ur: NEGATIVE mg/dL
Nitrite: NEGATIVE
PROTEIN: NEGATIVE mg/dL
SPECIFIC GRAVITY, URINE: 1.025 (ref 1.005–1.030)
Urobilinogen, UA: 0.2 mg/dL (ref 0.0–1.0)
pH: 6 (ref 5.0–8.0)

## 2013-11-08 LAB — URINE MICROSCOPIC-ADD ON

## 2013-11-08 LAB — POCT PREGNANCY, URINE: PREG TEST UR: NEGATIVE

## 2013-11-08 MED ORDER — PSEUDOEPHEDRINE HCL 60 MG PO TABS: 60.0000 mg | ORAL_TABLET | ORAL | Status: DC | PRN

## 2013-11-08 MED ORDER — DEXAMETHASONE 0.5 MG PO TABS
1.0000 mg | ORAL_TABLET | ORAL | Status: AC
  Administered 2013-11-08: 1 mg via ORAL
  Filled 2013-11-08: qty 2

## 2013-11-08 MED ORDER — DM-GUAIFENESIN ER 30-600 MG PO TB12: 2.0000 | ORAL_TABLET | Freq: Two times a day (BID) | ORAL | Status: DC

## 2013-11-08 MED ORDER — BACLOFEN 10 MG PO TABS: 10.0000 mg | ORAL_TABLET | Freq: Three times a day (TID) | ORAL | Status: DC | PRN

## 2013-11-08 MED ORDER — METOCLOPRAMIDE HCL 10 MG PO TABS
10.0000 mg | ORAL_TABLET | ORAL | Status: AC
  Administered 2013-11-08: 10 mg via ORAL
  Filled 2013-11-08: qty 1

## 2013-11-08 MED ORDER — DM-GUAIFENESIN ER 30-600 MG PO TB12
2.0000 | ORAL_TABLET | ORAL | Status: AC
  Administered 2013-11-08: 2 via ORAL
  Filled 2013-11-08: qty 2

## 2013-11-08 MED ORDER — DIPHENHYDRAMINE HCL 25 MG PO CAPS
25.0000 mg | ORAL_CAPSULE | ORAL | Status: AC
  Administered 2013-11-08: 25 mg via ORAL
  Filled 2013-11-08: qty 1

## 2013-11-08 NOTE — Discharge Instructions (Signed)
Upper Respiratory Infection, Adult °An upper respiratory infection (URI) is also sometimes known as the common cold. The upper respiratory tract includes the nose, sinuses, throat, trachea, and bronchi. Bronchi are the airways leading to the lungs. Most people improve within 1 week, but symptoms can last up to 2 weeks. A residual cough may last even longer.  °CAUSES °Many different viruses can infect the tissues lining the upper respiratory tract. The tissues become irritated and inflamed and often become very moist. Mucus production is also common. A cold is contagious. You can easily spread the virus to others by oral contact. This includes kissing, sharing a glass, coughing, or sneezing. Touching your mouth or nose and then touching a surface, which is then touched by another person, can also spread the virus. °SYMPTOMS  °Symptoms typically develop 1 to 3 days after you come in contact with a cold virus. Symptoms vary from person to person. They may include: °· Runny nose. °· Sneezing. °· Nasal congestion. °· Sinus irritation. °· Sore throat. °· Loss of voice (laryngitis). °· Cough. °· Fatigue. °· Muscle aches. °· Loss of appetite. °· Headache. °· Low-grade fever. °DIAGNOSIS  °You might diagnose your own cold based on familiar symptoms, since most people get a cold 2 to 3 times a year. Your caregiver can confirm this based on your exam. Most importantly, your caregiver can check that your symptoms are not due to another disease such as strep throat, sinusitis, pneumonia, asthma, or epiglottitis. Blood tests, throat tests, and X-rays are not necessary to diagnose a common cold, but they may sometimes be helpful in excluding other more serious diseases. Your caregiver will decide if any further tests are required. °RISKS AND COMPLICATIONS  °You may be at risk for a more severe case of the common cold if you smoke cigarettes, have chronic heart disease (such as heart failure) or lung disease (such as asthma), or if  you have a weakened immune system. The very young and very old are also at risk for more serious infections. Bacterial sinusitis, middle ear infections, and bacterial pneumonia can complicate the common cold. The common cold can worsen asthma and chronic obstructive pulmonary disease (COPD). Sometimes, these complications can require emergency medical care and may be life-threatening. °PREVENTION  °The best way to protect against getting a cold is to practice good hygiene. Avoid oral or hand contact with people with cold symptoms. Wash your hands often if contact occurs. There is no clear evidence that vitamin C, vitamin E, echinacea, or exercise reduces the chance of developing a cold. However, it is always recommended to get plenty of rest and practice good nutrition. °TREATMENT  °Treatment is directed at relieving symptoms. There is no cure. Antibiotics are not effective, because the infection is caused by a virus, not by bacteria. Treatment may include: °· Increased fluid intake. Sports drinks offer valuable electrolytes, sugars, and fluids. °· Breathing heated mist or steam (vaporizer or shower). °· Eating chicken soup or other clear broths, and maintaining good nutrition. °· Getting plenty of rest. °· Using gargles or lozenges for comfort. °· Controlling fevers with ibuprofen or acetaminophen as directed by your caregiver. °· Increasing usage of your inhaler if you have asthma. °Zinc gel and zinc lozenges, taken in the first 24 hours of the common cold, can shorten the duration and lessen the severity of symptoms. Pain medicines may help with fever, muscle aches, and throat pain. A variety of non-prescription medicines are available to treat congestion and runny nose. Your caregiver   can make recommendations and may suggest nasal or lung inhalers for other symptoms.  HOME CARE INSTRUCTIONS   Only take over-the-counter or prescription medicines for pain, discomfort, or fever as directed by your  caregiver.  Use a warm mist humidifier or inhale steam from a shower to increase air moisture. This may keep secretions moist and make it easier to breathe.  Drink enough water and fluids to keep your urine clear or pale yellow.  Rest as needed.  Return to work when your temperature has returned to normal or as your caregiver advises. You may need to stay home longer to avoid infecting others. You can also use a face mask and careful hand washing to prevent spread of the virus. SEEK MEDICAL CARE IF:   After the first few days, you feel you are getting worse rather than better.  You need your caregiver's advice about medicines to control symptoms.  You develop chills, worsening shortness of breath, or brown or red sputum. These may be signs of pneumonia.  You develop yellow or brown nasal discharge or pain in the face, especially when you bend forward. These may be signs of sinusitis.  You develop a fever, swollen neck glands, pain with swallowing, or white areas in the back of your throat. These may be signs of strep throat. SEEK IMMEDIATE MEDICAL CARE IF:   You have a fever.  You develop severe or persistent headache, ear pain, sinus pain, or chest pain.  You develop wheezing, a prolonged cough, cough up blood, or have a change in your usual mucus (if you have chronic lung disease).  You develop sore muscles or a stiff neck. Document Released: 01/05/2001 Document Revised: 10/04/2011 Document Reviewed: 11/13/2010 [facility] Patient Information 2014 [place], [place]. Migraine Headache A migraine headache is an intense, throbbing pain on one or both sides of your head. A migraine can last for 30 minutes to several hours. CAUSES  The exact cause of a migraine headache is not always known. However, a migraine may be caused when nerves in the brain become irritated and release chemicals that cause inflammation. This causes pain. Certain things may also trigger migraines, such  as:  Alcohol.  Smoking.  Stress.  Menstruation.  Aged cheeses.  Foods or drinks that contain nitrates, glutamate, aspartame, or tyramine.  Lack of sleep.  Chocolate.  Caffeine.  Hunger.  Physical exertion.  Fatigue.  Medicines used to treat chest pain (nitroglycerine), birth control pills, estrogen, and some blood pressure medicines. SIGNS AND SYMPTOMS  Pain on one or both sides of your head.  Pulsating or throbbing pain.  Severe pain that prevents daily activities.  Pain that is aggravated by any physical activity.  Nausea, vomiting, or both.  Dizziness.  Pain with exposure to bright lights, loud noises, or activity.  General sensitivity to bright lights, loud noises, or smells. Before you get a migraine, you may get warning signs that a migraine is coming (aura). An aura may include:  Seeing flashing lights.  Seeing bright spots, halos, or zig-zag lines.  Having tunnel vision or blurred vision.  Having feelings of numbness or tingling.  Having trouble talking.  Having muscle weakness. DIAGNOSIS  A migraine headache is often diagnosed based on:  Symptoms.  Physical exam.  A CT scan or MRI of your head. These imaging tests cannot diagnose migraines, but they can help rule out other causes of headaches. TREATMENT Medicines may be given for pain and nausea. Medicines can also be given to help prevent recurrent migraines.  HOME CARE INSTRUCTIONS  Only take over-the-counter or prescription medicines for pain or discomfort as directed by your health care provider. The use of long-term narcotics is not recommended.  Lie down in a dark, quiet room when you have a migraine.  Keep a journal to find out what may trigger your migraine headaches. For example, write down:  What you eat and drink.  How much sleep you get.  Any change to your diet or medicines.  Limit alcohol consumption.  Quit smoking if you smoke.  Get 7 9 hours of sleep, or as  recommended by your health care provider.  Limit stress.  Keep lights dim if bright lights bother you and make your migraines worse. SEEK IMMEDIATE MEDICAL CARE IF:   Your migraine becomes severe.  You have a fever.  You have a stiff neck.  You have vision loss.  You have muscular weakness or loss of muscle control.  You start losing your balance or have trouble walking.  You feel faint or pass out.  You have severe symptoms that are different from your first symptoms. MAKE SURE YOU:   Understand these instructions.  Will watch your condition.  Will get help right away if you are not doing well or get worse. Document Released: 07/12/2005 Document Revised: 05/02/2013 Document Reviewed: 03/19/2013 [facility] Patient Information 2014 [place], [place].  Breast Tenderness Breast tenderness is a common problem for women of all ages. Breast tenderness may cause mild discomfort to severe pain. It has a variety of causes. Your health care provider will find out the likely cause of your breast tenderness by examining your breasts, asking you about symptoms, and ordering some tests. Breast tenderness usually does not mean you have breast cancer. HOME CARE INSTRUCTIONS  Breast tenderness often can be handled at home. You can try:  Getting fitted for a new bra that provides more support, especially during exercise.  Wearing a more supportive bra or sports bra while sleeping when your breasts are very tender.  If you have a breast injury, apply ice to the area:  Put ice in a plastic bag.  Place a towel between your skin and the bag.  Leave the ice on for 20 minutes, 2 3 times a day.  If your breasts are too full of milk as a result of breastfeeding, try:  Expressing milk either by hand or with a breast pump.  Applying a warm compress to the breasts for relief.  Taking over-the-counter pain relievers, if approved by your health care provider.  Taking other medicines that your  health care provider prescribes. These may include antibiotic medicines or birth control pills. Over the long term, your breast tenderness might be eased if you:  Cut down on caffeine.  Reduce the amount of fat in your diet. Keep a log of the days and times when your breasts are most tender. This will help you and your health care provider find the cause of the tenderness and how to relieve it. Also, learn how to do breast exams at home. This will help you notice if you have an unusual growth or lump that could cause tenderness. SEEK MEDICAL CARE IF:   Any part of your breast is hard, red, and hot to the touch. This could be a sign of infection.  Fluid is coming out of your nipples (and you are not breastfeeding). Especially watch for blood or pus.  You have a fever as well as breast tenderness.  You have a new or painful  lump in your breast that remains after your menstrual period ends.  You have tried to take care of the pain at home, but it has not gone away.  Your breast pain is getting worse, or the pain is making it hard to do the things you usually do during your day. Document Released: 06/24/2008 Document Revised: 03/14/2013 Document Reviewed: 02/08/2013 [facility] Patient Information 2014 [place], [place].
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
## 2013-11-08 NOTE — MAU Note (Signed)
Cough, sore throat and ear ache.  Highest fever 100.2 reported

## 2013-11-08 NOTE — MAU Note (Signed)
Running a fever today 100.2.  Took allergy med, didn't really help.  (sinus drainage) left breast has been hurting a while.  Is red, no drainage.

## 2013-11-10 LAB — URINE CULTURE

## 2013-11-11 ENCOUNTER — Telehealth (HOSPITAL_COMMUNITY): Payer: Self-pay | Admitting: Obstetrics and Gynecology

## 2013-11-11 MED ORDER — SULFAMETHOXAZOLE-TRIMETHOPRIM 800-160 MG PO TABS: 1.0000 | ORAL_TABLET | Freq: Two times a day (BID) | ORAL | Status: AC

## 2013-11-12 ENCOUNTER — Other Ambulatory Visit: Payer: Self-pay | Admitting: Medical

## 2013-11-12 NOTE — MAU Provider Note (Signed)
Attestation of Attending Supervision of Advanced Practitioner (PA/CNM/NP): Evaluation and management procedures were performed by the Advanced Practitioner under my supervision and collaboration.  I have reviewed the Advanced Practitioner's note and chart, and I agree with the management and plan.  Jakaylee Sasaki, MD, FACOG Attending Obstetrician & Gynecologist Faculty Practice, [facility]  

## 2013-11-30 ENCOUNTER — Inpatient Hospital Stay (HOSPITAL_COMMUNITY)
Admission: AD | Admit: 2013-11-30 | Discharge: 2013-11-30 | Disposition: A | Discharge status: Home / Self Care | Payer: Medicare Other | Source: Ambulatory Visit | Attending: Family Medicine | Admitting: Family Medicine

## 2013-11-30 DIAGNOSIS — Z3202 Encounter for pregnancy test, result negative: Secondary | ICD-10-CM

## 2013-11-30 LAB — POCT PREGNANCY, URINE: Preg Test, Ur: NEGATIVE

## 2013-11-30 NOTE — MAU Provider Note (Signed)
Attestation of Attending Supervision of Advanced Practitioner (PA/CNM/NP): Evaluation and management procedures were performed by the Advanced Practitioner under my supervision and collaboration.  I have reviewed the Advanced Practitioner's note and chart, and I agree with the management and plan.  Reva Boresanya S Jeffre Enriques, MD [facility] Attending 11/30/2013 1:53 PM

## 2013-11-30 NOTE — MAU Note (Signed)
Patient states she has had 2 negative home pregnancy tests but thinks she is not doing it wrong. Is one week late for her period. Denies pain ,bleeding, discharge nausea or vomiting today. States she has a slight breast discharge from the left breast.

## 2013-11-30 NOTE — MAU Provider Note (Signed)
  HPI:  [name] is a G0P0 who presents to MAU for a pregnancy test. The patient has not taken any pregnancy tests at home because "I have no money". The patient and her significant other have been trying to conceive for one year.  The patient denies other concerns at this time; pregnancy is highly desired. Her period is one week late.    Objective  GENERAL: Well-developed, well-nourished female in no acute distress.  HEENT: Normocephalic, atraumatic.   LUNGS: Effort normal HEART: Regular rate  SKIN: Warm, dry and without erythema PSYCH: Normal mood and affect  Filed Vitals:   11/30/13 1159  BP: 120/85  Pulse: 79  Temp: 98.4 F (36.9 C)  Resp: 16    Results for orders placed during the hospital encounter of 11/30/13 (from the past 48 hour(s))  POCT PREGNANCY, URINE     Status: None   Collection Time    11/30/13 12:09 PM      Result Value Ref Range   Preg Test, Ur NEGATIVE  NEGATIVE   Comment:            THE SENSITIVITY OF THIS     METHODOLOGY IS >24 mIU/mL    MDM Urine pregnancy test negative.   Assessment:  Encounter for pregnancy test with results negative   Plan:  Discharge home in stable condition HD for preconception visit Take a pregnancy test in one week if pregnancy is still of concern.   Jacqueline HansenJennifer Irene Rasch, NP 11/30/2013 1:13 PM

## 2013-12-05 ENCOUNTER — Encounter (HOSPITAL_COMMUNITY): Payer: Self-pay | Admitting: Emergency Medicine

## 2013-12-05 ENCOUNTER — Emergency Department (HOSPITAL_COMMUNITY)
Admission: EM | Admit: 2013-12-05 | Discharge: 2013-12-05 | Disposition: A | Discharge status: Home / Self Care | Payer: Medicare Other | Attending: Emergency Medicine | Admitting: Emergency Medicine

## 2013-12-05 DIAGNOSIS — IMO0002 Reserved for concepts with insufficient information to code with codable children: Secondary | ICD-10-CM | POA: Insufficient documentation

## 2013-12-05 DIAGNOSIS — R569 Unspecified convulsions: Secondary | ICD-10-CM

## 2013-12-05 DIAGNOSIS — Z91040 Latex allergy status: Secondary | ICD-10-CM | POA: Diagnosis not present

## 2013-12-05 DIAGNOSIS — Y929 Unspecified place or not applicable: Secondary | ICD-10-CM | POA: Insufficient documentation

## 2013-12-05 DIAGNOSIS — J449 Chronic obstructive pulmonary disease, unspecified: Secondary | ICD-10-CM | POA: Diagnosis not present

## 2013-12-05 DIAGNOSIS — G40309 Generalized idiopathic epilepsy and epileptic syndromes, not intractable, without status epilepticus: Secondary | ICD-10-CM | POA: Insufficient documentation

## 2013-12-05 DIAGNOSIS — F3289 Other specified depressive episodes: Secondary | ICD-10-CM | POA: Diagnosis not present

## 2013-12-05 DIAGNOSIS — Z79899 Other long term (current) drug therapy: Secondary | ICD-10-CM | POA: Insufficient documentation

## 2013-12-05 DIAGNOSIS — Z87891 Personal history of nicotine dependence: Secondary | ICD-10-CM | POA: Insufficient documentation

## 2013-12-05 DIAGNOSIS — Z87440 Personal history of urinary (tract) infections: Secondary | ICD-10-CM | POA: Insufficient documentation

## 2013-12-05 DIAGNOSIS — F329 Major depressive disorder, single episode, unspecified: Secondary | ICD-10-CM | POA: Diagnosis not present

## 2013-12-05 DIAGNOSIS — F411 Generalized anxiety disorder: Secondary | ICD-10-CM | POA: Diagnosis not present

## 2013-12-05 DIAGNOSIS — G40909 Epilepsy, unspecified, not intractable, without status epilepticus: Secondary | ICD-10-CM | POA: Diagnosis not present

## 2013-12-05 DIAGNOSIS — Z3202 Encounter for pregnancy test, result negative: Secondary | ICD-10-CM | POA: Insufficient documentation

## 2013-12-05 DIAGNOSIS — J4489 Other specified chronic obstructive pulmonary disease: Secondary | ICD-10-CM | POA: Insufficient documentation

## 2013-12-05 DIAGNOSIS — R9431 Abnormal electrocardiogram [ECG] [EKG]: Secondary | ICD-10-CM | POA: Diagnosis not present

## 2013-12-05 DIAGNOSIS — Y9389 Activity, other specified: Secondary | ICD-10-CM | POA: Insufficient documentation

## 2013-12-05 DIAGNOSIS — W503XXA Accidental bite by another person, initial encounter: Secondary | ICD-10-CM | POA: Insufficient documentation

## 2013-12-05 LAB — I-STAT CHEM 8, ED
BUN: <3 mg/dL — ABNORMAL LOW (ref 6–23)
CALCIUM ION: 1.16 mmol/L (ref 1.12–1.23)
CREATININE: 0.7 mg/dL (ref 0.50–1.10)
Chloride: 103 mEq/L (ref 96–112)
Glucose, Bld: 85 mg/dL (ref 70–99)
HCT: 46 % (ref 36.0–46.0)
Hemoglobin: 15.6 g/dL — ABNORMAL HIGH (ref 12.0–15.0)
Potassium: 4 mEq/L (ref 3.7–5.3)
Sodium: 139 mEq/L (ref 137–147)
TCO2: 27 mmol/L (ref 0–100)

## 2013-12-05 LAB — URINALYSIS, ROUTINE W REFLEX MICROSCOPIC
Bilirubin Urine: NEGATIVE
Glucose, UA: NEGATIVE mg/dL
KETONES UR: NEGATIVE mg/dL
Leukocytes, UA: NEGATIVE
Nitrite: NEGATIVE
Protein, ur: 30 mg/dL — AB
Specific Gravity, Urine: 1.013 (ref 1.005–1.030)
Urobilinogen, UA: 0.2 mg/dL (ref 0.0–1.0)
pH: 6.5 (ref 5.0–8.0)

## 2013-12-05 LAB — URINE MICROSCOPIC-ADD ON

## 2013-12-05 LAB — PREGNANCY, URINE: Preg Test, Ur: NEGATIVE

## 2013-12-05 MED ORDER — PHENYTOIN SODIUM EXTENDED 100 MG PO CAPS: 100.0000 mg | ORAL_CAPSULE | Freq: Three times a day (TID) | ORAL | Status: DC

## 2013-12-05 MED ORDER — PHENYTOIN SODIUM EXTENDED 100 MG PO CAPS
300.0000 mg | ORAL_CAPSULE | Freq: Once | ORAL | Status: AC
  Administered 2013-12-05: 300 mg via ORAL
  Filled 2013-12-05: qty 3

## 2013-12-05 NOTE — Discharge Instructions (Signed)
Seizure, Adult A seizure is abnormal electrical activity in the brain. Seizures usually last from 30 seconds to 2 minutes. There are various types of seizures. Before a seizure, you may have a warning sensation (aura) that a seizure is about to occur. An aura may include the following symptoms:   Fear or anxiety.  Nausea.  Feeling like the room is spinning (vertigo).  Vision changes, such as seeing flashing lights or spots. Common symptoms during a seizure include:  A change in attention or behavior (altered mental status).  Convulsions with rhythmic jerking movements.  Drooling.  Rapid eye movements.  Grunting.  Loss of bladder and bowel control.  Bitter taste in the mouth.  Tongue biting. After a seizure, you may feel confused and sleepy. You may also have an injury resulting from convulsions during the seizure. HOME CARE INSTRUCTIONS   If you are given medicines, take them exactly as prescribed by your health care provider.  Keep all follow-up appointments as directed by your health care provider.  Do not swim or drive or engage in risky activity during which a seizure could cause further injury to you or others until your health care provider says it is OK.  Get adequate rest.  Teach friends and family what to do if you have a seizure. They should:  Lay you on the ground to prevent a fall.  Put a cushion under your head.  Loosen any tight clothing around your neck.  Turn you on your side. If vomiting occurs, this helps keep your airway clear.  Stay with you until you recover.  Know whether or not you need emergency care. SEEK IMMEDIATE MEDICAL CARE IF:  The seizure lasts longer than 5 minutes.  The seizure is severe or you do not wake up immediately after the seizure.  You have an altered mental status after the seizure.  You are having more frequent or worsening seizures. Someone should drive you to the emergency department or call local emergency  services (911 in [place] MAKE SURE YOU:  Understand these instructions.  Will watch your condition.  Will get help right away if you are not doing well or get worse. Document Released: 07/09/2000 Document Revised: 05/02/2013 Document Reviewed: 02/21/2013 [employer] Patient Information 2014 [employer].  

## 2013-12-05 NOTE — ED Notes (Signed)
Bed: [id number] Expected date:  Expected time:  Means of arrival:  Comments: EMS Seizure

## 2013-12-05 NOTE — ED Provider Notes (Signed)
CSN: [id number]     Arrival date & time 12/05/13  1212 History   First MD Initiated Contact with Patient 12/05/13 1239     Chief Complaint  Patient presents with  . Seizures     (Consider location/radiation/quality/duration/timing/severity/associated sxs/prior Treatment) HPI Comments: Patient presents with a seizure. 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 She has seizures about one time a month. Her seizures are tonic-clonic in her witnessed by her boyfriend. He states that last several minutes and then she has a period of confusion following this. She does typically bite her tongue as well. She states she seizure today which is typical for her normal seizures. She saw a neurologist one time in the past about 2 or 3 years ago. She had an MRI but did not have a followup appointment to get started on medications. She does seem distressed typically makes her seizures worse. She denies any recent illnesses. No fevers headache dizziness or neurologic deficits.  Patient is a 23 y.o. female presenting with seizures.  Seizures   Past Medical History  Diagnosis Date  . Asthma   . Anxiety   . Depression   . Anxiety   . Depression   . COPD (chronic obstructive pulmonary disease)   . Seizures     unknown cause- started 08/14  "monthly"  . Infection     UTI  . Frequent headaches   . UTI (lower urinary tract infection)    Past Surgical History  Procedure Laterality Date  . Cholecystectomy    . Appendectomy     Family History  Problem Relation Age of Onset  . Drug abuse Father   . Drug abuse Sister   . Drug abuse Brother    History  Substance Use Topics  . Smoking status: Former Smoker -- 0.25 packs/day for 10 years    Types: Cigarettes  . Smokeless tobacco: Former Neurosurgeon     Comment: she is wanting to quit trying  . Alcohol Use: 0.0 oz/week     Comment: former   OB History   Grav Para Term Preterm Abortions TAB SAB Ect Mult Living   0 0             Review of  Systems  Constitutional: Negative for fever, chills, diaphoresis and fatigue.  HENT: Negative for congestion, rhinorrhea and sneezing.   Eyes: Negative.   Respiratory: Negative for cough, chest tightness and shortness of breath.   Cardiovascular: Negative for chest pain and leg swelling.  Gastrointestinal: Negative for nausea, vomiting, abdominal pain, diarrhea and blood in stool.  Genitourinary: Negative for frequency, hematuria, flank pain and difficulty urinating.  Musculoskeletal: Negative for arthralgias and back pain.  Skin: Negative for rash.  Neurological: Positive for seizures. Negative for dizziness, speech difficulty, weakness, numbness and headaches.      Allergies  Toradol; Flexeril; Gabapentin; Tramadol; Vicodin; Adhesive; Keflet; Latex; and Levaquin  Home Medications   Prior to Admission medications   Medication Sig Start Date End Date Taking? Authorizing Provider  albuterol (PROVENTIL HFA;VENTOLIN HFA) 108 (90 BASE) MCG/ACT inhaler Inhale 1 puff into the lungs every 6 (six) hours as needed for wheezing or shortness of breath.   Yes Historical Provider, MD  ALPRAZolam Prudy Feeler) 1 MG tablet Take 0.5 mg by mouth 2 (two) times daily.   Yes Historical Provider, MD  baclofen (LIORESAL) 10 MG tablet Take 1 tablet (10 mg total) by mouth 3 (three) times daily as needed for muscle spasms. 11/08/13  Yes Wilmer Floor Leftwich-Kirby,  CNM  phenytoin (DILANTIN) 100 MG ER capsule Take 1 capsule (100 mg total) by mouth 3 (three) times daily. 12/05/13   Rolan BuccoMelanie Elvis Laufer, MD   BP 119/84  Pulse 111  Temp(Src) 98 F (36.7 C) (Oral)  Resp 16  SpO2 98%  LMP 11/28/2013 Physical Exam  Constitutional: She is oriented to person, place, and time. She appears well-developed and well-nourished.  HENT:  Head: Normocephalic and atraumatic.  Small abrasion to the right side of her tongue  Eyes: Pupils are equal, round, and reactive to light.  Neck: Normal range of motion. Neck supple.  Cardiovascular:  Normal rate, regular rhythm and normal heart sounds.   Pulmonary/Chest: Effort normal and breath sounds normal. No respiratory distress. She has no wheezes. She has no rales. She exhibits no tenderness.  Abdominal: Soft. Bowel sounds are normal. There is no tenderness. There is no rebound and no guarding.  Musculoskeletal: Normal range of motion. She exhibits no edema.  Lymphadenopathy:    She has no cervical adenopathy.  Neurological: She is alert and oriented to person, place, and time. She has normal strength. No cranial nerve deficit or sensory deficit. Coordination normal. GCS eye subscore is 4. GCS verbal subscore is 5. GCS motor subscore is 6.  Skin: Skin is warm and dry. No rash noted.  Psychiatric: She has a normal mood and affect.    ED Course  Procedures (including critical care time) Labs Review Results for orders placed during the hospital encounter of 12/05/13  URINALYSIS, ROUTINE W REFLEX MICROSCOPIC      Result Value Ref Range   Color, Urine YELLOW  YELLOW   APPearance CLEAR  CLEAR   Specific Gravity, Urine 1.013  1.005 - 1.030   pH 6.5  5.0 - 8.0   Glucose, UA NEGATIVE  NEGATIVE mg/dL   Hgb urine dipstick TRACE (*) NEGATIVE   Bilirubin Urine NEGATIVE  NEGATIVE   Ketones, ur NEGATIVE  NEGATIVE mg/dL   Protein, ur 30 (*) NEGATIVE mg/dL   Urobilinogen, UA 0.2  0.0 - 1.0 mg/dL   Nitrite NEGATIVE  NEGATIVE   Leukocytes, UA NEGATIVE  NEGATIVE  PREGNANCY, URINE      Result Value Ref Range   Preg Test, Ur NEGATIVE  NEGATIVE  URINE MICROSCOPIC-ADD ON      Result Value Ref Range   Squamous Epithelial / LPF RARE  RARE   WBC, UA 0-2  <3 WBC/hpf   RBC / HPF 0-2  <3 RBC/hpf   Bacteria, UA RARE  RARE  I-STAT CHEM 8, ED      Result Value Ref Range   Sodium 139  137 - 147 mEq/L   Potassium 4.0  3.7 - 5.3 mEq/L   Chloride 103  96 - 112 mEq/L   BUN <3 (*) 6 - 23 mg/dL   Creatinine, Ser 1.610.70  0.50 - 1.10 mg/dL   Glucose, Bld 85  70 - 99 mg/dL   Calcium, Ion 0.961.16  0.451.12 -  1.23 mmol/L   TCO2 27  0 - 100 mmol/L   Hemoglobin 15.6 (*) 12.0 - 15.0 g/dL   HCT 40.946.0  81.136.0 - 91.446.0 %   No results found.    Imaging Review No results found.   EKG Interpretation   Date/Time:  Wednesday Dec 05 2013 12:25:26 EDT Ventricular Rate:  102 PR Interval:  137 QRS Duration: 75 QT Interval:  335 QTC Calculation: 436 R Axis:   46 Text Interpretation:  Sinus tachycardia Nonspecific T abnormalities,  anterior leads Confirmed  by Wilkie AyeHORTON  MD, Toni AmendOURTNEY ([id number]) on 12/05/2013  12:29:53 PM      MDM   Final diagnoses:  Seizure    Patient presents with seizure. She states is typical for her normal seizures. She has no neurologic deficits or other red flags. She does seem that she has a seizure about once a month. She anyone get back in to see a neurologist. Given this I will go ahead and start her on Dilantin. She was loaded here in the ED. I encouraged her to have outpatient followup with a neurologist and gave her 2 possible places to followup.    Rolan BuccoMelanie Alfonzia Woolum, MD 12/05/13 [phone]

## 2013-12-05 NOTE — ED Notes (Signed)
She was seen by her fiancee and family to have a tonic-clonic seizure.  Currently she is in no distress.  She has sm. Bite mark on her tongue.

## 2013-12-22 ENCOUNTER — Emergency Department (HOSPITAL_COMMUNITY)
Admission: EM | Admit: 2013-12-22 | Discharge: 2013-12-22 | Disposition: A | Discharge status: Home / Self Care | Payer: Medicare Other | Attending: Emergency Medicine | Admitting: Emergency Medicine

## 2013-12-22 ENCOUNTER — Encounter (HOSPITAL_COMMUNITY): Payer: Self-pay | Admitting: Emergency Medicine

## 2013-12-22 DIAGNOSIS — F411 Generalized anxiety disorder: Secondary | ICD-10-CM | POA: Insufficient documentation

## 2013-12-22 DIAGNOSIS — N3090 Cystitis, unspecified without hematuria: Secondary | ICD-10-CM | POA: Diagnosis not present

## 2013-12-22 DIAGNOSIS — Z9089 Acquired absence of other organs: Secondary | ICD-10-CM | POA: Diagnosis not present

## 2013-12-22 DIAGNOSIS — F329 Major depressive disorder, single episode, unspecified: Secondary | ICD-10-CM | POA: Diagnosis not present

## 2013-12-22 DIAGNOSIS — Z91040 Latex allergy status: Secondary | ICD-10-CM | POA: Diagnosis not present

## 2013-12-22 DIAGNOSIS — G40909 Epilepsy, unspecified, not intractable, without status epilepticus: Secondary | ICD-10-CM | POA: Diagnosis not present

## 2013-12-22 DIAGNOSIS — N3000 Acute cystitis without hematuria: Secondary | ICD-10-CM | POA: Diagnosis not present

## 2013-12-22 DIAGNOSIS — J449 Chronic obstructive pulmonary disease, unspecified: Secondary | ICD-10-CM | POA: Diagnosis not present

## 2013-12-22 DIAGNOSIS — Z87440 Personal history of urinary (tract) infections: Secondary | ICD-10-CM | POA: Insufficient documentation

## 2013-12-22 DIAGNOSIS — Z79899 Other long term (current) drug therapy: Secondary | ICD-10-CM | POA: Diagnosis not present

## 2013-12-22 DIAGNOSIS — J4489 Other specified chronic obstructive pulmonary disease: Secondary | ICD-10-CM | POA: Insufficient documentation

## 2013-12-22 DIAGNOSIS — F3289 Other specified depressive episodes: Secondary | ICD-10-CM | POA: Insufficient documentation

## 2013-12-22 DIAGNOSIS — Z3202 Encounter for pregnancy test, result negative: Secondary | ICD-10-CM | POA: Diagnosis not present

## 2013-12-22 DIAGNOSIS — Z87891 Personal history of nicotine dependence: Secondary | ICD-10-CM | POA: Insufficient documentation

## 2013-12-22 LAB — URINALYSIS, ROUTINE W REFLEX MICROSCOPIC
Bilirubin Urine: NEGATIVE
Glucose, UA: NEGATIVE mg/dL
Hgb urine dipstick: NEGATIVE
Ketones, ur: 15 mg/dL — AB
Nitrite: NEGATIVE
Protein, ur: NEGATIVE mg/dL
Specific Gravity, Urine: 1.018 (ref 1.005–1.030)
Urobilinogen, UA: 1 mg/dL (ref 0.0–1.0)
pH: 7 (ref 5.0–8.0)

## 2013-12-22 LAB — URINE MICROSCOPIC-ADD ON

## 2013-12-22 LAB — PREGNANCY, URINE: Preg Test, Ur: NEGATIVE

## 2013-12-22 MED ORDER — DIPHENHYDRAMINE HCL 25 MG PO TABS: 25.0000 mg | ORAL_TABLET | Freq: Four times a day (QID) | ORAL | Status: DC | PRN

## 2013-12-22 MED ORDER — NITROFURANTOIN MONOHYD MACRO 100 MG PO CAPS
100.0000 mg | ORAL_CAPSULE | Freq: Once | ORAL | Status: AC
  Administered 2013-12-22: 100 mg via ORAL
  Filled 2013-12-22: qty 1

## 2013-12-22 MED ORDER — ONDANSETRON 4 MG PO TBDP: 4.0000 mg | ORAL_TABLET | Freq: Three times a day (TID) | ORAL | Status: DC | PRN

## 2013-12-22 MED ORDER — OXYCODONE-ACETAMINOPHEN 5-325 MG PO TABS
1.0000 | ORAL_TABLET | Freq: Four times a day (QID) | ORAL | Status: DC | PRN
Start: 2013-12-22 — End: 2013-12-30

## 2013-12-22 MED ORDER — OXYCODONE-ACETAMINOPHEN 5-325 MG PO TABS
1.0000 | ORAL_TABLET | Freq: Once | ORAL | Status: AC
  Administered 2013-12-22: 1 via ORAL
  Filled 2013-12-22: qty 1

## 2013-12-22 MED ORDER — DIPHENHYDRAMINE HCL 25 MG PO CAPS
25.0000 mg | ORAL_CAPSULE | Freq: Once | ORAL | Status: AC
  Administered 2013-12-22: 25 mg via ORAL
  Filled 2013-12-22: qty 1

## 2013-12-22 MED ORDER — NITROFURANTOIN MONOHYD MACRO 100 MG PO CAPS: 100.0000 mg | ORAL_CAPSULE | Freq: Two times a day (BID) | ORAL | Status: DC

## 2013-12-22 NOTE — ED Notes (Signed)
Pt w/ hx of kidney stones and UTI c/o bilateral low back pain x 2 days.  Denies dysuria.

## 2013-12-22 NOTE — ED Provider Notes (Signed)
CSN: [id number]     Arrival date & time 12/22/13  1130 History   First MD Initiated Contact with Patient 12/22/13 1249     Chief Complaint  Patient presents with  . Flank Pain     (Consider location/radiation/quality/duration/timing/severity/associated sxs/prior Treatment) HPI Comments: Patient is a [age] year old female past medical history significant for asthma, anxiety, depression, seizures presented to the emergency department for bilateral flank pain with associated dysuria and urinary frequency over the last 2 days. She also endorses associates suprapubic abdominal discomfort. Denies any fevers, emesis, diarrhea, constipation, vaginal bleeding or discharge, hematuria.  Patient is a 23 y.o. female presenting with flank pain.  Flank Pain Associated symptoms include nausea. Pertinent negatives include no abdominal pain, chills, fever or vomiting.    Past Medical History  Diagnosis Date  . Asthma   . Anxiety   . Depression   . Anxiety   . Depression   . COPD (chronic obstructive pulmonary disease)   . Seizures     unknown cause- started 08/14  "monthly"  . Infection     UTI  . Frequent headaches   . UTI (lower urinary tract infection)    Past Surgical History  Procedure Laterality Date  . Cholecystectomy    . Appendectomy     Family History  Problem Relation Age of Onset  . Drug abuse Father   . Drug abuse Sister   . Drug abuse Brother    History  Substance Use Topics  . Smoking status: Former Smoker -- 0.25 packs/day for 10 years    Types: Cigarettes  . Smokeless tobacco: Former NeurosurgeonUser     Comment: she is wanting to quit trying  . Alcohol Use: 0.0 oz/week     Comment: former   OB History   Grav Para Term Preterm Abortions TAB SAB Ect Mult Living   0 0             Review of Systems  Constitutional: Negative for fever and chills.  Gastrointestinal: Positive for nausea. Negative for vomiting, abdominal pain, diarrhea and constipation.  Genitourinary:  Positive for dysuria, frequency and flank pain. Negative for urgency, hematuria, decreased urine volume, vaginal bleeding, vaginal discharge and vaginal pain.  All other systems reviewed and are negative.     Allergies  Toradol; Flexeril; Gabapentin; Tramadol; Vicodin; Adhesive; Keflet; Latex; and Levaquin  Home Medications   Prior to Admission medications   Medication Sig Start Date End Date Taking? Authorizing Provider  albuterol (PROVENTIL HFA;VENTOLIN HFA) 108 (90 BASE) MCG/ACT inhaler Inhale 1 puff into the lungs every 6 (six) hours as needed for wheezing or shortness of breath.   Yes Historical Provider, MD  phenytoin (DILANTIN) 100 MG ER capsule Take 1 capsule (100 mg total) by mouth 3 (three) times daily. 12/05/13  Yes Rolan BuccoMelanie Belfi, MD  ALPRAZolam Prudy Feeler(XANAX) 1 MG tablet Take 0.5 mg by mouth 2 (two) times daily.    Historical Provider, MD  baclofen (LIORESAL) 10 MG tablet Take 1 tablet (10 mg total) by mouth 3 (three) times daily as needed for muscle spasms. 11/08/13   Misty StanleyLisa A Leftwich-Kirby, CNM  diphenhydrAMINE (BENADRYL) 25 MG tablet Take 1 tablet (25 mg total) by mouth every 6 (six) hours as needed for itching (Rash). 12/22/13   Aliviah Spain L Darleth Eustache, PA-C  nitrofurantoin, macrocrystal-monohydrate, (MACROBID) 100 MG capsule Take 1 capsule (100 mg total) by mouth 2 (two) times daily. 12/22/13   Jarriel Papillion L Grizelda Piscopo, PA-C  ondansetron (ZOFRAN ODT) 4 MG disintegrating tablet Take 1 tablet (  4 mg total) by mouth every 8 (eight) hours as needed for nausea or vomiting. 12/22/13   Lise Auer Bhavin Monjaraz, PA-C  oxyCODONE-acetaminophen (PERCOCET) 5-325 MG per tablet Take 1 tablet by mouth every 6 (six) hours as needed for severe pain. 12/22/13   Ashyla Luth L Urijah Raynor, PA-C   BP 105/74  Pulse 102  Temp(Src) 99.8 F (37.7 C) (Oral)  Resp 16  SpO2 100%  LMP 11/28/2013 Physical Exam  Nursing note and vitals reviewed. Constitutional: She is oriented to person, place, and time. She appears  well-developed and well-nourished. No distress.  HENT:  Head: Normocephalic and atraumatic.  Right Ear: External ear normal.  Left Ear: External ear normal.  Nose: Nose normal.  Mouth/Throat: Uvula is midline, oropharynx is clear and moist and mucous membranes are normal.  Eyes: Conjunctivae are normal.  Neck: Normal range of motion. Neck supple.  Cardiovascular: Normal rate, regular rhythm and normal heart sounds.   Pulmonary/Chest: Effort normal and breath sounds normal. No respiratory distress.  Abdominal: Soft. Normal appearance and bowel sounds are normal. She exhibits no distension. There is tenderness in the suprapubic area. There is CVA tenderness (left). There is no rigidity, no rebound and no guarding.  Musculoskeletal: Normal range of motion.  Neurological: She is alert and oriented to person, place, and time.  Skin: Skin is warm and dry. She is not diaphoretic.  Psychiatric: She has a normal mood and affect.    ED Course  Procedures (including critical care time) Medications  oxyCODONE-acetaminophen (PERCOCET/ROXICET) 5-325 MG per tablet 1 tablet (1 tablet Oral Given 12/22/13 1432)  diphenhydrAMINE (BENADRYL) capsule 25 mg (25 mg Oral Given 12/22/13 1432)  nitrofurantoin (macrocrystal-monohydrate) (MACROBID) capsule 100 mg (100 mg Oral Given 12/22/13 1529)    Labs Review Labs Reviewed  URINALYSIS, ROUTINE W REFLEX MICROSCOPIC - Abnormal; Notable for the following:    APPearance CLOUDY (*)    Ketones, ur 15 (*)    Leukocytes, UA LARGE (*)    All other components within normal limits  URINE MICROSCOPIC-ADD ON - Abnormal; Notable for the following:    Squamous Epithelial / LPF FEW (*)    All other components within normal limits  PREGNANCY, URINE    Imaging Review No results found.   EKG Interpretation None      MDM   Final diagnoses:  Cystitis    Filed Vitals:   12/22/13 1530  BP: 105/74  Pulse: 102  Temp:   Resp: 16   Afebrile, NAD, non-toxic  appearing, AAOx4. Abdomen soft, mildly tender in the suprapubic region, nondistended. No peritoneal signs. Left-sided CVA tenderness noted. UA with large leukocytes, nitrate negative will cover her for cystitis with antibiotics. Return precautions discussed. Patient is agreeable to plan. Patient stable at time of discharge.     Jeannetta Ellis, PA-C 12/22/13 1533

## 2013-12-22 NOTE — Discharge Instructions (Signed)
Please follow up with your primary care physician in 1-2 days. If you do not have one please call the [facility] and wellness Center number listed above. Please take your antibiotic until completion. Please take pain medication and/or muscle relaxants as prescribed and as needed for pain. Please do not drive on narcotic pain medication or on muscle relaxants. Please read all discharge instructions and return precautions.    Urinary Tract Infection Urinary tract infections (UTIs) can develop anywhere along your urinary tract. Your urinary tract is your body's drainage system for removing wastes and extra water. Your urinary tract includes two kidneys, two ureters, a bladder, and a urethra. Your kidneys are a pair of bean-shaped organs. Each kidney is about the size of your fist. They are located below your ribs, one on each side of your spine. CAUSES Infections are caused by microbes, which are microscopic organisms, including fungi, viruses, and bacteria. These organisms are so small that they can only be seen through a microscope. Bacteria are the microbes that most commonly cause UTIs. SYMPTOMS  Symptoms of UTIs may vary by age and gender of the patient and by the location of the infection. Symptoms in young women typically include a frequent and intense urge to urinate and a painful, burning feeling in the bladder or urethra during urination. Older women and men are more likely to be tired, shaky, and weak and have muscle aches and abdominal pain. A fever may mean the infection is in your kidneys. Other symptoms of a kidney infection include pain in your back or sides below the ribs, nausea, and vomiting. DIAGNOSIS To diagnose a UTI, your caregiver will ask you about your symptoms. Your caregiver also will ask to provide a urine sample. The urine sample will be tested for bacteria and white blood cells. White blood cells are made by your body to help fight infection. TREATMENT  Typically, UTIs can be  treated with medication. Because most UTIs are caused by a bacterial infection, they usually can be treated with the use of antibiotics. The choice of antibiotic and length of treatment depend on your symptoms and the type of bacteria causing your infection. HOME CARE INSTRUCTIONS  If you were prescribed antibiotics, take them exactly as your caregiver instructs you. Finish the medication even if you feel better after you have only taken some of the medication.  Drink enough water and fluids to keep your urine clear or pale yellow.  Avoid caffeine, tea, and carbonated beverages. They tend to irritate your bladder.  Empty your bladder often. Avoid holding urine for long periods of time.  Empty your bladder before and after sexual intercourse.  After a bowel movement, women should cleanse from front to back. Use each tissue only once. SEEK MEDICAL CARE IF:   You have back pain.  You develop a fever.  Your symptoms do not begin to resolve within 3 days. SEEK IMMEDIATE MEDICAL CARE IF:   You have severe back pain or lower abdominal pain.  You develop chills.  You have nausea or vomiting.  You have continued burning or discomfort with urination. MAKE SURE YOU:   Understand these instructions.  Will watch your condition.  Will get help right away if you are not doing well or get worse. Document Released: 04/21/2005 Document Revised: 01/11/2012 Document Reviewed: 08/20/2011 [facility] Patient Information 2014 [place], [place].

## 2013-12-27 NOTE — ED Provider Notes (Signed)
Medical screening examination/treatment/procedure(s) were performed by non-physician practitioner and as supervising physician I was immediately available for consultation/collaboration.   EKG Interpretation None       Herschell Virani, MD 12/27/13 0209 

## 2013-12-30 ENCOUNTER — Inpatient Hospital Stay (HOSPITAL_COMMUNITY)
Admission: AD | Admit: 2013-12-30 | Discharge: 2013-12-30 | Disposition: A | Discharge status: Home / Self Care | Payer: Medicare Other | Source: Ambulatory Visit | Attending: Obstetrics & Gynecology | Admitting: Obstetrics & Gynecology

## 2013-12-30 ENCOUNTER — Encounter (HOSPITAL_COMMUNITY): Payer: Self-pay

## 2013-12-30 DIAGNOSIS — M545 Low back pain, unspecified: Secondary | ICD-10-CM | POA: Insufficient documentation

## 2013-12-30 DIAGNOSIS — Z87891 Personal history of nicotine dependence: Secondary | ICD-10-CM | POA: Insufficient documentation

## 2013-12-30 DIAGNOSIS — N390 Urinary tract infection, site not specified: Secondary | ICD-10-CM | POA: Diagnosis not present

## 2013-12-30 DIAGNOSIS — J4489 Other specified chronic obstructive pulmonary disease: Secondary | ICD-10-CM | POA: Insufficient documentation

## 2013-12-30 DIAGNOSIS — M549 Dorsalgia, unspecified: Secondary | ICD-10-CM

## 2013-12-30 DIAGNOSIS — G8929 Other chronic pain: Secondary | ICD-10-CM

## 2013-12-30 DIAGNOSIS — J449 Chronic obstructive pulmonary disease, unspecified: Secondary | ICD-10-CM | POA: Insufficient documentation

## 2013-12-30 HISTORY — DX: Sepsis, unspecified organism: A41.9

## 2013-12-30 HISTORY — DX: Pneumonia, unspecified organism: J18.9

## 2013-12-30 LAB — URINALYSIS, ROUTINE W REFLEX MICROSCOPIC
BILIRUBIN URINE: NEGATIVE
Glucose, UA: NEGATIVE mg/dL
Hgb urine dipstick: NEGATIVE
Ketones, ur: NEGATIVE mg/dL
Leukocytes, UA: NEGATIVE
Nitrite: POSITIVE — AB
PROTEIN: NEGATIVE mg/dL
Specific Gravity, Urine: 1.02 (ref 1.005–1.030)
Urobilinogen, UA: 0.2 mg/dL (ref 0.0–1.0)
pH: 7 (ref 5.0–8.0)

## 2013-12-30 LAB — RAPID URINE DRUG SCREEN, HOSP PERFORMED
Amphetamines: NOT DETECTED
BARBITURATES: NOT DETECTED
Benzodiazepines: POSITIVE — AB
COCAINE: NOT DETECTED
Opiates: NOT DETECTED
TETRAHYDROCANNABINOL: NOT DETECTED

## 2013-12-30 LAB — URINE MICROSCOPIC-ADD ON

## 2013-12-30 LAB — WET PREP, GENITAL
CLUE CELLS WET PREP: NONE SEEN
TRICH WET PREP: NONE SEEN
Yeast Wet Prep HPF POC: NONE SEEN

## 2013-12-30 LAB — POCT PREGNANCY, URINE: Preg Test, Ur: NEGATIVE

## 2013-12-30 MED ORDER — FLUCONAZOLE 150 MG PO TABS: 150.0000 mg | ORAL_TABLET | Freq: Every day | ORAL | Status: DC

## 2013-12-30 MED ORDER — BACLOFEN 10 MG PO TABS
10.0000 mg | ORAL_TABLET | Freq: Once | ORAL | Status: AC
  Administered 2013-12-30: 10 mg via ORAL
  Filled 2013-12-30: qty 1

## 2013-12-30 MED ORDER — NITROFURANTOIN MONOHYD MACRO 100 MG PO CAPS: 100.0000 mg | ORAL_CAPSULE | Freq: Two times a day (BID) | ORAL | Status: DC

## 2013-12-30 MED ORDER — BACLOFEN 10 MG PO TABS: 10.0000 mg | ORAL_TABLET | Freq: Three times a day (TID) | ORAL | Status: DC

## 2013-12-30 NOTE — MAU Note (Signed)
Pt presents to MAU with complaints of lower back pain  With painful urination and states she is three days late on her menstrual cycle.

## 2013-12-30 NOTE — Discharge Instructions (Signed)
Back Exercises Back exercises help treat and prevent back injuries. The goal of back exercises is to increase the strength of your abdominal and back muscles and the flexibility of your back. These exercises should be started when you no longer have back pain. Back exercises include:  Pelvic Tilt. Lie on your back with your knees bent. Tilt your pelvis until the lower part of your back is against the floor. Hold this position 5 to 10 sec and repeat 5 to 10 times.  Knee to Chest. Pull first 1 knee up against your chest and hold for 20 to 30 seconds, repeat this with the other knee, and then both knees. This may be done with the other leg straight or bent, whichever feels better.  Sit-Ups or Curl-Ups. Bend your knees 90 degrees. Start with tilting your pelvis, and do a partial, slow sit-up, lifting your trunk only 30 to 45 degrees off the floor. Take at least 2 to 3 seconds for each sit-up. Do not do sit-ups with your knees out straight. If partial sit-ups are difficult, simply do the above but with only tightening your abdominal muscles and holding it as directed.  Hip-Lift. Lie on your back with your knees flexed 90 degrees. Push down with your feet and shoulders as you raise your hips a couple inches off the floor; hold for 10 seconds, repeat 5 to 10 times.  Back arches. Lie on your stomach, propping yourself up on bent elbows. Slowly press on your hands, causing an arch in your low back. Repeat 3 to 5 times. Any initial stiffness and discomfort should lessen with repetition over time.  Shoulder-Lifts. Lie face down with arms beside your body. Keep hips and torso pressed to floor as you slowly lift your head and shoulders off the floor. Do not overdo your exercises, especially in the beginning. Exercises may cause you some mild back discomfort which lasts for a few minutes; however, if the pain is more severe, or lasts for more than 15 minutes, do not continue exercises until you see your caregiver.  Improvement with exercise therapy for back problems is slow.  See your caregivers for assistance with developing a proper back exercise program. Document Released: 08/19/2004 Document Revised: 10/04/2011 Document Reviewed: 05/13/2011 [facility] Patient Information 2014 [place], [place]. Urinary Tract Infection Urinary tract infections (UTIs) can develop anywhere along your urinary tract. Your urinary tract is your body's drainage system for removing wastes and extra water. Your urinary tract includes two kidneys, two ureters, a bladder, and a urethra. Your kidneys are a pair of bean-shaped organs. Each kidney is about the size of your fist. They are located below your ribs, one on each side of your spine. CAUSES Infections are caused by microbes, which are microscopic organisms, including fungi, viruses, and bacteria. These organisms are so small that they can only be seen through a microscope. Bacteria are the microbes that most commonly cause UTIs. SYMPTOMS  Symptoms of UTIs may vary by age and gender of the patient and by the location of the infection. Symptoms in young women typically include a frequent and intense urge to urinate and a painful, burning feeling in the bladder or urethra during urination. Older women and men are more likely to be tired, shaky, and weak and have muscle aches and abdominal pain. A fever may mean the infection is in your kidneys. Other symptoms of a kidney infection include pain in your back or sides below the ribs, nausea, and vomiting. DIAGNOSIS To diagnose a UTI,  your caregiver will ask you about your symptoms. Your caregiver also will ask to provide a urine sample. The urine sample will be tested for bacteria and white blood cells. White blood cells are made by your body to help fight infection. TREATMENT  Typically, UTIs can be treated with medication. Because most UTIs are caused by a bacterial infection, they usually can be treated with the use of antibiotics. The  choice of antibiotic and length of treatment depend on your symptoms and the type of bacteria causing your infection. HOME CARE INSTRUCTIONS  If you were prescribed antibiotics, take them exactly as your caregiver instructs you. Finish the medication even if you feel better after you have only taken some of the medication.  Drink enough water and fluids to keep your urine clear or pale yellow.  Avoid caffeine, tea, and carbonated beverages. They tend to irritate your bladder.  Empty your bladder often. Avoid holding urine for long periods of time.  Empty your bladder before and after sexual intercourse.  After a bowel movement, women should cleanse from front to back. Use each tissue only once. SEEK MEDICAL CARE IF:   You have back pain.  You develop a fever.  Your symptoms do not begin to resolve within 3 days. SEEK IMMEDIATE MEDICAL CARE IF:   You have severe back pain or lower abdominal pain.  You develop chills.  You have nausea or vomiting.  You have continued burning or discomfort with urination. MAKE SURE YOU:   Understand these instructions.  Will watch your condition.  Will get help right away if you are not doing well or get worse. Document Released: 04/21/2005 Document Revised: 01/11/2012 Document Reviewed: 08/20/2011 [facility] Patient Information 2014 [place].

## 2013-12-30 NOTE — MAU Note (Signed)
Pt states was told she had a kidney infection a week ago and completed 5 day course of abx. Having low back pain that radiates up bilaterally. No abnormal vaginal discharge or bleeding.

## 2013-12-30 NOTE — MAU Provider Note (Signed)
History     CSN: [id number]  Arrival date and time: 12/30/13 1221   First Provider Initiated Contact with Patient 12/30/13 1247      Chief Complaint  Patient presents with  . Possible Pregnancy   HPI Comments: Albin FischerSarah J Hovey 23 y.o. G0P0 presents to MAU with complaints of low back pain. She has a history of low back pain and takes Baclofen prn. She was treated for UTI with Macrobid in late May. She only took 3 tablets because she thought that she had an allergic reaction because she developed a vaginal yeast infection. There was no urine culture done. She has multiple drug allergies. She has psych and drug abuse issues.   Possible Pregnancy      Past Medical History  Diagnosis Date  . Asthma   . Anxiety   . Depression   . Anxiety   . Depression   . COPD (chronic obstructive pulmonary disease)   . Seizures     unknown cause- started 08/14  "monthly"  . Infection     UTI  . Frequent headaches   . UTI (lower urinary tract infection)   . Sepsis   . Pneumonia     History reviewed. No pertinent past surgical history.  Family History  Problem Relation Age of Onset  . Drug abuse Father   . Drug abuse Sister   . Drug abuse Brother     History  Substance Use Topics  . Smoking status: Former Smoker -- 0.25 packs/day for 10 years    Types: Cigarettes  . Smokeless tobacco: Former NeurosurgeonUser     Comment: she is wanting to quit trying  . Alcohol Use: 0.0 oz/week     Comment: former    Allergies:  Allergies  Allergen Reactions  . Toradol [Ketorolac Tromethamine] Itching    seizure  . Flexeril [Cyclobenzaprine] Other (See Comments)    seizure  . Gabapentin Other (See Comments)    seizures   . Tramadol Itching    Seizures  . Vicodin [Hydrocodone-Acetaminophen] Itching    Pt states she can take Tylenol without difficulty.  . Adhesive [Tape] Rash  . Keflet [Cephalexin] Rash  . Latex Rash  . Levaquin [Levofloxacin In D5w] Rash    Prescriptions prior to admission   Medication Sig Dispense Refill  . albuterol (PROVENTIL HFA;VENTOLIN HFA) 108 (90 BASE) MCG/ACT inhaler Inhale 1 puff into the lungs every 6 (six) hours as needed for wheezing or shortness of breath.      . ALPRAZolam (XANAX) 1 MG tablet Take 0.5 mg by mouth 2 (two) times daily.      . baclofen (LIORESAL) 10 MG tablet Take 1 tablet (10 mg total) by mouth 3 (three) times daily as needed for muscle spasms.  30 each  0  . diphenhydrAMINE (BENADRYL) 25 MG tablet Take 1 tablet (25 mg total) by mouth every 6 (six) hours as needed for itching (Rash).  30 tablet  0  . nitrofurantoin, macrocrystal-monohydrate, (MACROBID) 100 MG capsule Take 1 capsule (100 mg total) by mouth 2 (two) times daily.  10 capsule  0  . ondansetron (ZOFRAN ODT) 4 MG disintegrating tablet Take 1 tablet (4 mg total) by mouth every 8 (eight) hours as needed for nausea or vomiting.  10 tablet  0  . oxyCODONE-acetaminophen (PERCOCET) 5-325 MG per tablet Take 1 tablet by mouth every 6 (six) hours as needed for severe pain.  10 tablet  0  . phenytoin (DILANTIN) 100 MG ER capsule Take 1 capsule (  100 mg total) by mouth 3 (three) times daily.  90 capsule  0    Review of Systems  Constitutional: Negative.   HENT: Negative.   Eyes: Negative.   Respiratory: Negative.   Cardiovascular: Negative.   Gastrointestinal: Negative.   Genitourinary: Negative.   Musculoskeletal: Positive for back pain.  Skin: Negative.   Neurological: Negative.   Psychiatric/Behavioral: The patient is nervous/anxious.    Physical Exam   Last menstrual period 11/28/2013.  Physical Exam  Constitutional: She is oriented to person, place, and time. She appears well-developed and well-nourished. No distress.  HENT:  Head: Normocephalic and atraumatic.  Eyes: Pupils are equal, round, and reactive to light.  Cardiovascular: Normal rate, regular rhythm and normal heart sounds.   Respiratory: Effort normal and breath sounds normal. No respiratory distress. She  has no wheezes. She has no rales.  GI: Soft. Bowel sounds are normal. She exhibits no distension. There is no tenderness. There is no rebound.  Genitourinary:  Genital: External negative Vaginal:thick white clumpy discharge Cervix:closed/ thick Bimanual: negative   Musculoskeletal: Normal range of motion. She exhibits tenderness.  Low back muscle tenderness  Neurological: She is alert and oriented to person, place, and time.  Skin: Skin is warm and dry.  Psychiatric: She is slowed.   Results for orders placed during the hospital encounter of 12/30/13 (from the past 24 hour(s))  URINALYSIS, ROUTINE W REFLEX MICROSCOPIC     Status: Abnormal   Collection Time    12/30/13 12:25 PM      Result Value Ref Range   Color, Urine YELLOW  YELLOW   APPearance CLEAR  CLEAR   Specific Gravity, Urine 1.020  1.005 - 1.030   pH 7.0  5.0 - 8.0   Glucose, UA NEGATIVE  NEGATIVE mg/dL   Hgb urine dipstick NEGATIVE  NEGATIVE   Bilirubin Urine NEGATIVE  NEGATIVE   Ketones, ur NEGATIVE  NEGATIVE mg/dL   Protein, ur NEGATIVE  NEGATIVE mg/dL   Urobilinogen, UA 0.2  0.0 - 1.0 mg/dL   Nitrite POSITIVE (*) NEGATIVE   Leukocytes, UA NEGATIVE  NEGATIVE  URINE MICROSCOPIC-ADD ON     Status: Abnormal   Collection Time    12/30/13 12:25 PM      Result Value Ref Range   Squamous Epithelial / LPF FEW (*) RARE   WBC, UA 0-2  <3 WBC/hpf   RBC / HPF 0-2  <3 RBC/hpf   Bacteria, UA MANY (*) RARE  POCT PREGNANCY, URINE     Status: None   Collection Time    12/30/13 12:33 PM      Result Value Ref Range   Preg Test, Ur NEGATIVE  NEGATIVE    MAU Course  Procedures  MDM  Wet prep/ GC/ chlamydia Baclofen 10 mg po now  Assessment and Plan   A: UTI  P: Macrobid 100 mg PO BID x 7days Diflucan 150 mg / refill x1 Baclofen for home Follow up prn  Doralee Albino Emmaleigh Longo 12/30/2013, 1:16 PM

## 2013-12-31 LAB — GC/CHLAMYDIA PROBE AMP
CT Probe RNA: NEGATIVE
GC Probe RNA: NEGATIVE

## 2014-01-01 LAB — URINE CULTURE: Colony Count: >=100000

## 2014-01-22 ENCOUNTER — Ambulatory Visit: Payer: Medicare Other | Admitting: Neurology

## 2014-01-31 ENCOUNTER — Ambulatory Visit: Payer: Medicare Other | Admitting: Neurology

## 2014-02-01 ENCOUNTER — Telehealth: Payer: Self-pay | Admitting: Neurology

## 2014-02-01 ENCOUNTER — Inpatient Hospital Stay (HOSPITAL_COMMUNITY)
Admission: AD | Admit: 2014-02-01 | Discharge: 2014-02-01 | Disposition: A | Discharge status: Home / Self Care | Payer: Medicare Other | Source: Ambulatory Visit | Attending: Obstetrics & Gynecology | Admitting: Obstetrics & Gynecology

## 2014-02-01 ENCOUNTER — Encounter (HOSPITAL_COMMUNITY): Payer: Self-pay | Admitting: *Deleted

## 2014-02-01 DIAGNOSIS — R109 Unspecified abdominal pain: Secondary | ICD-10-CM | POA: Insufficient documentation

## 2014-02-01 DIAGNOSIS — J449 Chronic obstructive pulmonary disease, unspecified: Secondary | ICD-10-CM | POA: Diagnosis not present

## 2014-02-01 DIAGNOSIS — M545 Low back pain, unspecified: Secondary | ICD-10-CM | POA: Insufficient documentation

## 2014-02-01 DIAGNOSIS — N3000 Acute cystitis without hematuria: Secondary | ICD-10-CM | POA: Diagnosis not present

## 2014-02-01 DIAGNOSIS — Z87891 Personal history of nicotine dependence: Secondary | ICD-10-CM | POA: Insufficient documentation

## 2014-02-01 DIAGNOSIS — Z3202 Encounter for pregnancy test, result negative: Secondary | ICD-10-CM | POA: Diagnosis not present

## 2014-02-01 DIAGNOSIS — R110 Nausea: Secondary | ICD-10-CM | POA: Insufficient documentation

## 2014-02-01 DIAGNOSIS — F3289 Other specified depressive episodes: Secondary | ICD-10-CM | POA: Diagnosis not present

## 2014-02-01 DIAGNOSIS — N390 Urinary tract infection, site not specified: Secondary | ICD-10-CM | POA: Diagnosis not present

## 2014-02-01 DIAGNOSIS — J4489 Other specified chronic obstructive pulmonary disease: Secondary | ICD-10-CM | POA: Insufficient documentation

## 2014-02-01 DIAGNOSIS — F329 Major depressive disorder, single episode, unspecified: Secondary | ICD-10-CM | POA: Insufficient documentation

## 2014-02-01 LAB — WET PREP, GENITAL
Clue Cells Wet Prep HPF POC: NONE SEEN
TRICH WET PREP: NONE SEEN
WBC, Wet Prep HPF POC: NONE SEEN
YEAST WET PREP: NONE SEEN

## 2014-02-01 LAB — URINALYSIS, ROUTINE W REFLEX MICROSCOPIC
Bilirubin Urine: NEGATIVE
GLUCOSE, UA: NEGATIVE mg/dL
Hgb urine dipstick: NEGATIVE
KETONES UR: NEGATIVE mg/dL
Nitrite: POSITIVE — AB
PH: 5.5 (ref 5.0–8.0)
Protein, ur: NEGATIVE mg/dL
Specific Gravity, Urine: >1.03 — ABNORMAL HIGH (ref 1.005–1.030)
Urobilinogen, UA: 0.2 mg/dL (ref 0.0–1.0)

## 2014-02-01 LAB — URINE MICROSCOPIC-ADD ON

## 2014-02-01 LAB — POCT PREGNANCY, URINE: Preg Test, Ur: NEGATIVE

## 2014-02-01 LAB — HCG, QUANTITATIVE, PREGNANCY: HCG, BETA CHAIN, QUANT, S: 1 m[IU]/mL (ref <5)

## 2014-02-01 MED ORDER — NITROFURANTOIN MONOHYD MACRO 100 MG PO CAPS: 100.0000 mg | ORAL_CAPSULE | Freq: Two times a day (BID) | ORAL | Status: DC

## 2014-02-01 NOTE — MAU Provider Note (Signed)
Attestation of Attending Supervision of Advanced Practitioner (PA/CNM/NP): Evaluation and management procedures were performed by the Advanced Practitioner under my supervision and collaboration.  I have reviewed the Advanced Practitioner's note and chart, and I agree with the management and plan.  Jemari Hallum, MD, FACOG Attending Obstetrician & Gynecologist Faculty Practice, [facility]   

## 2014-02-01 NOTE — MAU Note (Addendum)
Patient states LMP was 12/31/13. Lower abdominal cramping and leaking from left breast for 1 week.

## 2014-02-01 NOTE — MAU Provider Note (Signed)
History     CSN: [id number]  Arrival date and time: 02/01/14 [id number]   First Provider Initiated Contact with Patient 02/01/14 [phone]      Chief Complaint  Patient presents with  . Possible Pregnancy   HPI [name] is a 23 y.o. G0P0 who presents to MAU today with complaint of nausea, lower abdominal cramping and late menses. The patient states LMP was 12/31/13. She has had nausea without vomiting for a few days. She also endorses fatigue. She states suprapubic cramping x 1 month and low back cramping pain as well. She denies vaginal bleeding or discharge, fever or UTI symptoms. She is sexually active and does not use birth control or condoms.   OB History   Grav Para Term Preterm Abortions TAB SAB Ect Mult Living   0 0              Past Medical History  Diagnosis Date  . Asthma   . Anxiety   . Depression   . Anxiety   . Depression   . COPD (chronic obstructive pulmonary disease)   . Seizures     unknown cause- started 08/14  "monthly"  . Infection     UTI  . Frequent headaches   . UTI (lower urinary tract infection)   . Sepsis   . Pneumonia     History reviewed. No pertinent past surgical history.  Family History  Problem Relation Age of Onset  . Drug abuse Father   . Drug abuse Sister   . Drug abuse Brother     History  Substance Use Topics  . Smoking status: Former Smoker -- 0.25 packs/day for 10 years    Types: Cigarettes  . Smokeless tobacco: Former NeurosurgeonUser     Comment: she is wanting to quit trying  . Alcohol Use: 0.0 oz/week     Comment: former    Allergies:  Allergies  Allergen Reactions  . Toradol [Ketorolac Tromethamine] Itching    seizure  . Flexeril [Cyclobenzaprine] Other (See Comments)    seizure  . Gabapentin Other (See Comments)    seizures   . Tramadol Itching    Seizures  . Vicodin [Hydrocodone-Acetaminophen] Itching    Pt states she can take Tylenol without difficulty.  . Adhesive [Tape] Rash  . Keflet [Cephalexin] Rash  .  Latex Rash  . Levaquin [Levofloxacin In D5w] Rash    Prescriptions prior to admission  Medication Sig Dispense Refill  . albuterol (PROVENTIL HFA;VENTOLIN HFA) 108 (90 BASE) MCG/ACT inhaler Inhale 1 puff into the lungs every 6 (six) hours as needed for wheezing or shortness of breath.      . Ibuprofen-Diphenhydramine HCl (ADVIL PM) 200-25 MG CAPS Take 1-2 tablets by mouth at bedtime as needed (for pain/sleep).         Review of Systems  Constitutional: Positive for malaise/fatigue. Negative for fever.  Gastrointestinal: Positive for nausea and abdominal pain. Negative for vomiting, diarrhea and constipation.  Genitourinary: Negative for dysuria, urgency and frequency.       Neg - vaginal bleeding, discharge  Musculoskeletal: Positive for back pain.  Neurological: Negative for weakness.   Physical Exam   Blood pressure 102/71, pulse 100, temperature 98.5 F (36.9 C), temperature source Oral, resp. rate 16, last menstrual period 12/31/2013.  Physical Exam  Constitutional: She is oriented to person, place, and time. She appears well-developed and well-nourished. No distress.  HENT:  Head: Normocephalic and atraumatic.  Cardiovascular: Regular rhythm and normal heart sounds.  Tachycardia  present.   Respiratory: Effort normal and breath sounds normal. No respiratory distress.  GI: Soft. Bowel sounds are normal. She exhibits no distension and no mass. There is tenderness (moderate tenderness to palpation of the lower abdomen at midline). There is no rebound and no guarding.  Genitourinary: Uterus is tender (mild). Uterus is not enlarged. Cervix exhibits no motion tenderness, no discharge and no friability. Right adnexum displays tenderness. Right adnexum displays no mass. Left adnexum displays tenderness (mild). Left adnexum displays no mass. No bleeding around the vagina. Vaginal discharge (small amount of thin, white, malodorous discharge noted) found.  Neurological: She is alert and  oriented to person, place, and time.  Skin: Skin is warm and dry. No erythema.  Psychiatric: She has a normal mood and affect.   Results for orders placed during the hospital encounter of 02/01/14 (from the past 24 hour(s))  URINALYSIS, ROUTINE W REFLEX MICROSCOPIC     Status: Abnormal   Collection Time    02/01/14  9:35 AM      Result Value Ref Range   Color, Urine YELLOW  YELLOW   APPearance CLEAR  CLEAR   Specific Gravity, Urine >1.030 (*) 1.005 - 1.030   pH 5.5  5.0 - 8.0   Glucose, UA NEGATIVE  NEGATIVE mg/dL   Hgb urine dipstick NEGATIVE  NEGATIVE   Bilirubin Urine NEGATIVE  NEGATIVE   Ketones, ur NEGATIVE  NEGATIVE mg/dL   Protein, ur NEGATIVE  NEGATIVE mg/dL   Urobilinogen, UA 0.2  0.0 - 1.0 mg/dL   Nitrite POSITIVE (*) NEGATIVE   Leukocytes, UA MODERATE (*) NEGATIVE  URINE MICROSCOPIC-ADD ON     Status: Abnormal   Collection Time    02/01/14  9:35 AM      Result Value Ref Range   Squamous Epithelial / LPF MANY (*) RARE   WBC, UA 7-10  <3 WBC/hpf   RBC / HPF 0-2  <3 RBC/hpf   Bacteria, UA FEW (*) RARE   Urine-Other MUCOUS PRESENT    POCT PREGNANCY, URINE     Status: None   Collection Time    02/01/14  9:46 AM      Result Value Ref Range   Preg Test, Ur NEGATIVE  NEGATIVE  HCG, QUANTITATIVE, PREGNANCY     Status: None   Collection Time    02/01/14  9:54 AM      Result Value Ref Range   hCG, Beta Chain, Quant, S 1  <5 mIU/mL  WET PREP, GENITAL     Status: None   Collection Time    02/01/14 10:07 AM      Result Value Ref Range   Yeast Wet Prep HPF POC NONE SEEN  NONE SEEN   Trich, Wet Prep NONE SEEN  NONE SEEN   Clue Cells Wet Prep HPF POC NONE SEEN  NONE SEEN   WBC, Wet Prep HPF POC NONE SEEN  NONE SEEN    MAU Course  Procedures None  MDM UPT - negative UA, wet prep, GC/Chlamydia, quant hCG today  Assessment and Plan  A: UTI Negative pregnancy test  P: Discharge home Rx for Macrobid sent to patient's pharmacy Advised increased PO hydration as  tolerated Recommended Ibuprofen PRN pain Warning signs for pyelonephritis discussed Patient may return to MAU as needed or if her condition were to change or worsen  Freddi Starr, PA-C  02/01/2014, 10:52 AM

## 2014-02-01 NOTE — Telephone Encounter (Signed)
Pt no showed 01/31/14 appt w/ Dr. Everlena CooperJaffe. She has since r/s appt. Appt marked as no show but letter not sent since pt has already r/s / Jacqueline S.

## 2014-02-01 NOTE — MAU Note (Signed)
Did an "online test", said she was preg.  Cycle is late, and has been nauseated.

## 2014-02-01 NOTE — Discharge Instructions (Signed)

## 2014-02-02 LAB — GC/CHLAMYDIA PROBE AMP
CT PROBE, AMP APTIMA: NEGATIVE
GC Probe RNA: NEGATIVE

## 2014-02-05 ENCOUNTER — Encounter: Payer: Self-pay | Admitting: Neurology

## 2014-02-05 ENCOUNTER — Ambulatory Visit (INDEPENDENT_AMBULATORY_CARE_PROVIDER_SITE_OTHER): Payer: Medicare Other | Admitting: Neurology

## 2014-02-05 VITALS — BP 102/68 | HR 68 | Temp 97.7°F | Resp 16 | Ht 62.0 in | Wt 155.4 lb

## 2014-02-05 DIAGNOSIS — G43909 Migraine, unspecified, not intractable, without status migrainosus: Secondary | ICD-10-CM

## 2014-02-05 DIAGNOSIS — G40209 Localization-related (focal) (partial) symptomatic epilepsy and epileptic syndromes with complex partial seizures, not intractable, without status epilepticus: Secondary | ICD-10-CM

## 2014-02-05 DIAGNOSIS — H5700 Unspecified anomaly of pupillary function: Secondary | ICD-10-CM

## 2014-02-05 DIAGNOSIS — R569 Unspecified convulsions: Secondary | ICD-10-CM

## 2014-02-05 DIAGNOSIS — H5702 Anisocoria: Secondary | ICD-10-CM

## 2014-02-05 DIAGNOSIS — F39 Unspecified mood [affective] disorder: Secondary | ICD-10-CM | POA: Diagnosis not present

## 2014-02-05 LAB — HCG, SERUM, QUALITATIVE: PREG SERUM: NEGATIVE

## 2014-02-05 MED ORDER — CARBAMAZEPINE ER 200 MG PO TB12: ORAL_TABLET | ORAL | Status: DC
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
## 2014-02-05 NOTE — Patient Instructions (Addendum)
1.  We will start Tegretol XR 200mg  tablets.  Take 1tablet twice daily for 7 days, then 1 tablet in the morning and 2 tablets at bedtime for 7 days, then 2tablets twice daily.  Side effects may include sleepiness, dizziness or nausea.  This is a good choice because it is also good for mood.  Call at the end of the prescription and we can refill it. Please check a pregnancy test  Before starting medication . 2.  Start taking folic acid 1mg  daily.  Consider condoms as this medication may reduce effect of birth control pills. 3.  No driving 4.  Will get another MRI of the brain. And MRA of Head [place]  02/20/14 at 1:45 pm  5.  EEG 6.  Refer to psychiatry Behavorial health  DR Plovsky  [phone] 7.  Follow up in 3 months. 8. Pregnancy test

## 2014-02-07 ENCOUNTER — Telehealth: Payer: Self-pay | Admitting: Neurology

## 2014-02-07 NOTE — Telephone Encounter (Signed)
Patient is aware of test results they are negative she was advised to start her tegratol asap

## 2014-02-07 NOTE — Telephone Encounter (Signed)
Wants to know the results of the blood work please call

## 2014-02-12 ENCOUNTER — Other Ambulatory Visit: Payer: Medicare Other

## 2014-02-15 ENCOUNTER — Inpatient Hospital Stay (HOSPITAL_COMMUNITY): Payer: Medicare Other

## 2014-02-15 ENCOUNTER — Encounter (HOSPITAL_COMMUNITY): Payer: Self-pay | Admitting: *Deleted

## 2014-02-15 ENCOUNTER — Inpatient Hospital Stay (HOSPITAL_COMMUNITY)
Admission: AD | Admit: 2014-02-15 | Discharge: 2014-02-16 | Disposition: A | Discharge status: Home / Self Care | Payer: Medicare Other | Source: Ambulatory Visit | Attending: Obstetrics and Gynecology | Admitting: Obstetrics and Gynecology

## 2014-02-15 DIAGNOSIS — Z87891 Personal history of nicotine dependence: Secondary | ICD-10-CM | POA: Diagnosis not present

## 2014-02-15 DIAGNOSIS — M549 Dorsalgia, unspecified: Secondary | ICD-10-CM | POA: Insufficient documentation

## 2014-02-15 DIAGNOSIS — N949 Unspecified condition associated with female genital organs and menstrual cycle: Secondary | ICD-10-CM | POA: Insufficient documentation

## 2014-02-15 DIAGNOSIS — N390 Urinary tract infection, site not specified: Secondary | ICD-10-CM | POA: Insufficient documentation

## 2014-02-15 DIAGNOSIS — R109 Unspecified abdominal pain: Secondary | ICD-10-CM | POA: Diagnosis not present

## 2014-02-15 LAB — URINALYSIS, ROUTINE W REFLEX MICROSCOPIC
Bilirubin Urine: NEGATIVE
Glucose, UA: NEGATIVE mg/dL
KETONES UR: NEGATIVE mg/dL
Leukocytes, UA: NEGATIVE
Nitrite: POSITIVE — AB
PROTEIN: NEGATIVE mg/dL
Specific Gravity, Urine: 1.015 (ref 1.005–1.030)
UROBILINOGEN UA: 0.2 mg/dL (ref 0.0–1.0)
pH: 6 (ref 5.0–8.0)

## 2014-02-15 LAB — URINE MICROSCOPIC-ADD ON

## 2014-02-15 LAB — POCT PREGNANCY, URINE: Preg Test, Ur: NEGATIVE

## 2014-02-15 MED ORDER — BACLOFEN 10 MG PO TABS
10.0000 mg | ORAL_TABLET | Freq: Once | ORAL | Status: AC
Start: 2014-02-15 — End: 2014-02-15
  Administered 2014-02-15: 10 mg via ORAL
  Filled 2014-02-15: qty 1

## 2014-02-15 NOTE — MAU Provider Note (Signed)
History     CSN: [id number]  Arrival date and time: 02/15/14 2009   None     Chief Complaint  Patient presents with  . Abdominal Pain  . Possible Pregnancy  . Back Pain   HPI This is a 23 y.o. female who presents with c/o lower abdominal and back pain. Recently treated for UTI with Macrobid. Has had multiple UTIs in recent weeks. Period is late, has had negative UPTs but wants another one. States her neurologist told her she cannot be on any kind of birth control because of her seizure meds.   RN Note:  PT SAYS SHE STARTED HAVING LOWER ABD PAIN - ON WED. HURTS SAME NOW. LMP- 12-31-2013-- LATE- IS USUALLY ON TIME . Marland Kitchen HAD UPT- 7-10 AND AT [facility]- DR JAPHNEY - FOR SEIZURES, LAST WEEK- BOTH NEG. LAST SEX- TODAY. NO BIRTH CONTROL. NO GYN DR. NO VAG D/C. HAD UTI- 7-10- TOOK MEDS. HAS BACK PAIN- X SEVERAL MTHS- COMES/ GOES        I'm having abd pain and back pain. Just finished med for uti. Want a pregnancy test. One at neurologist was negative and negative test here recently.         OB History   Grav Para Term Preterm Abortions TAB SAB Ect Mult Living   0 0              Past Medical History  Diagnosis Date  . Asthma   . Anxiety   . Depression   . Anxiety   . Depression   . COPD (chronic obstructive pulmonary disease)   . Seizures     unknown cause- started 08/14  "monthly"  . Infection     UTI  . Frequent headaches   . UTI (lower urinary tract infection)   . Sepsis   . Pneumonia     History reviewed. No pertinent past surgical history.  Family History  Problem Relation Age of Onset  . Drug abuse Father   . Drug abuse Sister   . Drug abuse Brother     History  Substance Use Topics  . Smoking status: Former Smoker -- 0.25 packs/day for 10 years    Types: Cigarettes  . Smokeless tobacco: Former Neurosurgeon     Comment: she is wanting to quit trying  . Alcohol Use: 3.0 oz/week    5 Cans of beer per week     Comment: former  LAST TIME -MONDAY- BEER    Allergies:   Allergies  Allergen Reactions  . Toradol [Ketorolac Tromethamine] Itching    seizure  . Flexeril [Cyclobenzaprine] Other (See Comments)    seizure  . Gabapentin Other (See Comments)    seizures   . Tramadol Itching    Seizures  . Vicodin [Hydrocodone-Acetaminophen] Itching    Pt states she can take Tylenol without difficulty.  . Adhesive [Tape] Rash  . Keflet [Cephalexin] Rash  . Latex Rash  . Levaquin [Levofloxacin In D5w] Rash    Prescriptions prior to admission  Medication Sig Dispense Refill  . albuterol (PROVENTIL HFA;VENTOLIN HFA) 108 (90 BASE) MCG/ACT inhaler Inhale 2 puffs into the lungs every 6 (six) hours as needed for wheezing or shortness of breath.       . carbamazepine (TEGRETOL XR) 200 MG 12 hr tablet Take 200-400 mg by mouth See admin instructions. Take 1 tablet twice daily for 7 days.  Next 1 tablet in the morning and 2 tablets at bedtime for 7 days then,  2 tablets twice  daily after that.      . carbamazepine (TEGRETOL-XR) 200 MG 12 hr tablet Take 1tab BID x7d, then 1tab qAM & 2tabs qHS, then 2tabs BID  120 tablet  0  . Ibuprofen-Diphenhydramine HCl (ADVIL PM) 200-25 MG CAPS Take 1 tablet by mouth at bedtime as needed (for pain/sleep).         Review of Systems  Constitutional: Negative for fever, chills and malaise/fatigue.  Gastrointestinal: Positive for abdominal pain. Negative for nausea, vomiting, diarrhea and constipation.  Genitourinary: Positive for frequency and flank pain. Negative for dysuria.  Musculoskeletal: Negative for myalgias.  Neurological: Negative for headaches.   Physical Exam   Blood pressure 122/85, pulse 79, resp. rate 18, height 5\' 2"  (1.575 m), weight 71.487 kg (157 lb 9.6 oz), last menstrual period 12/31/2013.  Physical Exam  Constitutional: She is oriented to person, place, and time. She appears well-developed and well-nourished. No distress.  HENT:  Head: Normocephalic.  Cardiovascular: Normal rate.   Respiratory: Effort  normal.  GI: Soft. She exhibits no distension and no mass. There is tenderness (lower abdomen). There is no rebound and no guarding.  Genitourinary: Vagina normal and uterus normal. No vaginal discharge found.  Musculoskeletal: Normal range of motion. She exhibits tenderness (slightly tender Left CVAT).  Neurological: She is alert and oriented to person, place, and time.  Skin: Skin is warm and dry.  Psychiatric: She has a normal mood and affect.    MAU Course  Procedures  MDM US ordered of pelvis (has not had one in recent years) and of kidneys to make sure there is not a stone.  Wants Baclofen for pain. Is allergic to multiple meds and says this what they usually give her for pain.  Results for orders placed during the hospital encounter of 02/15/14 (from the past 24 hour(s))  URINALYSIS, ROUTINE W REFLEX MICROSCOPIC     Status: Abnormal   Collection Time    02/15/14  8:40 PM      Result Value Ref Range   Color, Urine YELLOW  YELLOW   APPearance CLEAR  CLEAR   Specific Gravity, Urine 1.015  1.005 - 1.030   pH 6.0  5.0 - 8.0   Glucose, UA NEGATIVE  NEGATIVE mg/dL   Hgb urine dipstick TRACE (*) NEGATIVE   Bilirubin Urine NEGATIVE  NEGATIVE   Ketones, ur NEGATIVE  NEGATIVE mg/dL   Protein, ur NEGATIVE  NEGATIVE mg/dL   Urobilinogen, UA 0.2  0.0 - 1.0 mg/dL   Nitrite POSITIVE (*) NEGATIVE   Leukocytes, UA NEGATIVE  NEGATIVE  URINE MICROSCOPIC-ADD ON     Status: Abnormal   Collection Time    02/15/14  8:40 PM      Result Value Ref Range   Squamous Epithelial / LPF RARE  RARE   WBC, UA 0-2  <3 WBC/hpf   RBC / HPF 0-2  <3 RBC/hpf   Bacteria, UA MANY (*) RARE  POCT PREGNANCY, URINE     Status: None   Collection Time    02/15/14  8:50 PM      Result Value Ref Range   Preg Test, Ur NEGATIVE  NEGATIVE    [place] Pelvis Complete  02/16/2014   CLINICAL DATA:  Pelvic and abdominal pain.  EXAM: TRANSABDOMINAL AND TRANSVAGINAL ULTRASOUND OF PELVIS  TECHNIQUE: Both transabdominal and  transvaginal ultrasound examinations of the pelvis were performed. Transabdominal technique was performed for global imaging of the pelvis including uterus, ovaries, adnexal regions, and pelvic cul-de-sac. It was necessary to  proceed with endovaginal exam following the transabdominal exam to visualize the uterus and ovaries in greater detail.  COMPARISON:  CT of the abdomen and pelvis performed 04/21/2013    FINDINGS: Uterus  Measurements: 7.5 x 3.3 x 4.0 cm. No fibroids or other mass visualized.  Endometrium  Thickness: 0.9 cm. Trace fluid is noted at the endometrial canal and at the cervix. There is a somewhat thickened appearance to the cervix on transabdominal imaging.  Right ovary  Measurements: 5.4 x 2.8 x 3.9 cm. Normal appearance/no adnexal mass.  Left ovary  Measurements: 3.4 x 3.0 x 2.7 cm. Normal appearance/no adnexal mass.  Other findings  No free fluid is seen within the pelvic cul-de-sac.    IMPRESSION: 1. Somewhat thickened appearance to the cervix, of uncertain significance. Would correlate for recent Pap smear results. 2. Trace fluid along the endometrial canal and at the cervix. 3. Otherwise unremarkable pelvic ultrasound.     Electronically Signed   By: Roanna Raider M.D.   On: 02/16/2014 00:16   US Renal  02/16/2014   CLINICAL DATA:  Left flank pain.  Frequent urinary tract infections.  EXAM: RENAL/URINARY TRACT ULTRASOUND COMPLETE  COMPARISON:  CT of the abdomen and pelvis performed 04/21/2013  FINDINGS: Right Kidney:  Length: 8.7 cm. Echogenicity within normal limits. No mass or hydronephrosis visualized.  Left Kidney:  Length: 10.2 cm. Echogenicity within normal limits. No mass or hydronephrosis visualized.  Bladder:  Appears normal for degree of bladder distention. Bilateral ureteral jets are visualized.    IMPRESSION: Unremarkable renal ultrasound.     Electronically Signed   By: Roanna Raider M.D.   On: 02/16/2014 00:09    Assessment and Plan  A:  ABdominal and pelvic  pain, probably related to UTI      Hx UTI resistant to Macrobid in April 2015      Frequent UTIs      Normal Renal US      Normal pelvic US except for thickened cervix of unknown significance    P:  Will retreat UTI with Septra DS       ReCulture urine       Rx Pyridium       List of OB/GYN providers given       Recommend she obtain a primary MD to coordinate her care  [facility] 02/15/2014, 10:01 PM

## 2014-02-15 NOTE — MAU Note (Signed)
I'm having abd pain and back pain. Just finished med for uti. Want a pregnancy test. One at neurologist was negative and negative test here recently.

## 2014-02-15 NOTE — MAU Note (Addendum)
PT SAYS SHE STARTED  HAVING LOWER ABD PAIN - ON WED.   HURTS SAME NOW.     LMP- 12-31-2013-- LATE-  IS USUALLY ON TIME . Marland Kitchen.    HAD UPT-  7-10 AND AT [facility]- DR JAPHNEY  - FOR SEIZURES,   LAST WEEK-  BOTH NEG.     LAST SEX-  TODAY.  NO BIRTH CONTROL.    NO GYN DR.   NO VAG D/C.      HAD UTI-  7-10- TOOK MEDS.      HAS BACK PAIN-  X SEVERAL MTHS-  COMES/ GOES

## 2014-02-16 DIAGNOSIS — N390 Urinary tract infection, site not specified: Secondary | ICD-10-CM

## 2014-02-16 MED ORDER — PHENAZOPYRIDINE HCL 200 MG PO TABS: 200.0000 mg | ORAL_TABLET | Freq: Three times a day (TID) | ORAL | Status: DC | PRN

## 2014-02-16 MED ORDER — SULFAMETHOXAZOLE-TMP DS 800-160 MG PO TABS: 1.0000 | ORAL_TABLET | Freq: Two times a day (BID) | ORAL | Status: DC

## 2014-02-16 NOTE — Discharge Instructions (Signed)

## 2014-02-16 NOTE — Progress Notes (Signed)
Marie Williams CNM in earlier to discuss test results and d/c plan. Written and verbal d/c instructions given and understanding voiced °

## 2014-02-17 NOTE — MAU Provider Note (Signed)
Attestation of Attending Supervision of Advanced Practitioner: Evaluation and management procedures were performed by the PA/NP/CNM/OB Fellow under my supervision/collaboration. Chart reviewed and agree with management and plan.  Karletta Millay V 02/17/2014 6:35 AM

## 2014-02-18 ENCOUNTER — Encounter: Payer: Self-pay | Admitting: Advanced Practice Midwife

## 2014-02-18 DIAGNOSIS — N390 Urinary tract infection, site not specified: Secondary | ICD-10-CM | POA: Insufficient documentation

## 2014-02-18 LAB — URINE CULTURE: Colony Count: >=100000

## 2014-02-19 ENCOUNTER — Telehealth: Payer: Self-pay | Admitting: Neurology

## 2014-02-19 ENCOUNTER — Other Ambulatory Visit: Payer: Self-pay | Admitting: Family

## 2014-02-19 ENCOUNTER — Other Ambulatory Visit: Payer: Medicare Other

## 2014-02-19 NOTE — Telephone Encounter (Signed)
I will mail no show warning letter to pt. If pt no shows another appt w/ Dr. Everlena CooperJaffe or EEG, will will dismiss her from the practice. I have asked Susie to call the pt regarding her MRI and I will call her to r/s the EEG / Sherri S.

## 2014-02-19 NOTE — Telephone Encounter (Signed)
EEG marked as a no show b/c it wascanc less than 24hrs prior to the appt. Dr. Everlena CooperJaffe has approved us rescheduling this appt but I will be mailing a final no show warning letter to the pt b/c she has a repeat history of no shows. She would also like to get her MRI rescheduled. I will contact her to r/s the EEG, please contact her to schedule the MRI / Sherri

## 2014-02-19 NOTE — Telephone Encounter (Signed)
Message copied by Griffin DakinSANDS, SHERRI R on Tue Feb 19, 2014  2:13 PM ------      Message from: JAFFE, ADAM R      Created: Tue Feb 19, 2014 11:18 AM       We should give her a warning letter stating that if she no-shows another appointment with me or another EEG appointment, then she will be discharged from the practice.      ----- Message -----         From: Griffin DakinSherri R Sands         Sent: 02/19/2014  10:32 AM           To: Cira ServantAdam Robert Jaffe, DO            [phone] - pt has called back a 2nd time, now asking that we schedule her MRI. I advised pt we will call her back / Sherri             Pt called to r/s her EEG. She has no showed 2 EEG's and 2 appts w/ Dr. Everlena CooperJaffe since 01/22/14. Please advise-can we r/s ger EEG w/ a final no show warning or do you want to d/c due to no shows? Thanks!! Sherri              ------

## 2014-02-19 NOTE — Telephone Encounter (Signed)
Pt called today (02-19-14 at 8:12 )to say that she cant make her EEG appt she will call back to resch Annabelle Harmanana

## 2014-02-20 ENCOUNTER — Ambulatory Visit (HOSPITAL_COMMUNITY): Admission: RE | Admit: 2014-02-20 | Payer: Medicare Other | Source: Ambulatory Visit

## 2014-02-20 ENCOUNTER — Encounter: Payer: Self-pay | Admitting: Neurology

## 2014-02-20 ENCOUNTER — Telehealth: Payer: Self-pay | Admitting: Neurology

## 2014-02-20 NOTE — Telephone Encounter (Signed)
LM for pt to call and r/s her routine EEG, ok'ed by Dr. Everlena CooperJaffe. I have also mailed her final no show warning letter out today. Awaiting return call from pt / Sherri

## 2014-02-20 NOTE — Telephone Encounter (Signed)
Pt called to r/s MRI. CB# [phone] / Oneita KrasSherri S,

## 2014-02-20 NOTE — Telephone Encounter (Signed)
Patient will call and make new appt for MRI

## 2014-02-26 DIAGNOSIS — F410 Panic disorder [episodic paroxysmal anxiety] without agoraphobia: Secondary | ICD-10-CM | POA: Diagnosis not present

## 2014-03-08 ENCOUNTER — Inpatient Hospital Stay (HOSPITAL_COMMUNITY)
Admission: AD | Admit: 2014-03-08 | Discharge: 2014-03-08 | Disposition: A | Discharge status: Home / Self Care | Payer: Medicare Other | Source: Ambulatory Visit | Attending: Obstetrics and Gynecology | Admitting: Obstetrics and Gynecology

## 2014-03-08 ENCOUNTER — Encounter (HOSPITAL_COMMUNITY): Payer: Self-pay | Admitting: *Deleted

## 2014-03-08 DIAGNOSIS — Z87891 Personal history of nicotine dependence: Secondary | ICD-10-CM | POA: Insufficient documentation

## 2014-03-08 DIAGNOSIS — F1910 Other psychoactive substance abuse, uncomplicated: Secondary | ICD-10-CM | POA: Insufficient documentation

## 2014-03-08 DIAGNOSIS — M549 Dorsalgia, unspecified: Secondary | ICD-10-CM | POA: Diagnosis not present

## 2014-03-08 DIAGNOSIS — R51 Headache: Secondary | ICD-10-CM | POA: Diagnosis present

## 2014-03-08 DIAGNOSIS — F341 Dysthymic disorder: Secondary | ICD-10-CM | POA: Diagnosis not present

## 2014-03-08 DIAGNOSIS — G40909 Epilepsy, unspecified, not intractable, without status epilepticus: Secondary | ICD-10-CM | POA: Diagnosis not present

## 2014-03-08 DIAGNOSIS — J449 Chronic obstructive pulmonary disease, unspecified: Secondary | ICD-10-CM | POA: Insufficient documentation

## 2014-03-08 DIAGNOSIS — J4489 Other specified chronic obstructive pulmonary disease: Secondary | ICD-10-CM | POA: Insufficient documentation

## 2014-03-08 DIAGNOSIS — G43009 Migraine without aura, not intractable, without status migrainosus: Secondary | ICD-10-CM | POA: Diagnosis not present

## 2014-03-08 DIAGNOSIS — N390 Urinary tract infection, site not specified: Secondary | ICD-10-CM

## 2014-03-08 LAB — POCT PREGNANCY, URINE: Preg Test, Ur: NEGATIVE

## 2014-03-08 LAB — URINALYSIS, ROUTINE W REFLEX MICROSCOPIC
BILIRUBIN URINE: NEGATIVE
GLUCOSE, UA: NEGATIVE mg/dL
Hgb urine dipstick: NEGATIVE
KETONES UR: NEGATIVE mg/dL
Nitrite: POSITIVE — AB
PH: 6 (ref 5.0–8.0)
PROTEIN: NEGATIVE mg/dL
Specific Gravity, Urine: 1.015 (ref 1.005–1.030)
Urobilinogen, UA: 0.2 mg/dL (ref 0.0–1.0)

## 2014-03-08 LAB — RAPID URINE DRUG SCREEN, HOSP PERFORMED
AMPHETAMINES: NOT DETECTED
Barbiturates: NOT DETECTED
Benzodiazepines: NOT DETECTED
COCAINE: NOT DETECTED
OPIATES: NOT DETECTED
Tetrahydrocannabinol: NOT DETECTED

## 2014-03-08 LAB — URINE MICROSCOPIC-ADD ON

## 2014-03-08 MED ORDER — SUMATRIPTAN SUCCINATE 100 MG PO TABS: 100.0000 mg | ORAL_TABLET | Freq: Once | ORAL | Status: DC | PRN

## 2014-03-08 MED ORDER — NITROFURANTOIN MONOHYD MACRO 100 MG PO CAPS: 100.0000 mg | ORAL_CAPSULE | Freq: Two times a day (BID) | ORAL | Status: DC

## 2014-03-08 NOTE — MAU Provider Note (Signed)
History     CSN: [id number]  Arrival date and time: 03/08/14 4098   First Provider Initiated Contact with Patient 03/08/14 1207      Chief Complaint  Patient presents with  . Nausea  . Headache   Patient presents today for evaluation of headaches, back pain which are described below.  She states she has not had access to medical care and has not been adequately treated for her past medical history or her current complaints.  She seeks treatment today for headache, back pain, and nausea and reports having an appt soon with a neurologist (Dr. Lahoma Rocker) re: her seizure disorder.  Headache  This is a chronic problem. The current episode started more than 1 year ago. The problem occurs intermittently. The problem has been waxing and waning. The pain is located in the parietal region. The pain does not radiate. The pain quality is similar to prior headaches. The quality of the pain is described as pulsating and dull. The pain is moderate. Associated symptoms include back pain, nausea, phonophobia and seizures. Pertinent negatives include no abdominal pain, blurred vision, coughing, dizziness, fever, loss of balance, neck pain, numbness, photophobia, tingling, visual change, vomiting or weakness. The symptoms are aggravated by unknown. She has tried nothing for the symptoms.  Back Pain This is a recurrent problem. The current episode started 1 to 4 weeks ago. The problem occurs constantly. The problem has been waxing and waning since onset. The pain is present in the thoracic spine. The quality of the pain is described as aching. The pain is at a severity of 5/10. Associated symptoms include headaches. Pertinent negatives include no abdominal pain, chest pain, dysuria, fever, numbness, paresis, paresthesias, tingling or weakness. She has tried muscle relaxant for the symptoms. The treatment provided significant relief.     Past Medical History  Diagnosis Date  . Asthma   . Anxiety   . Depression    . Anxiety   . Depression   . COPD (chronic obstructive pulmonary disease)   . Seizures     unknown cause- started 08/14  "monthly"  . Infection     UTI  . Frequent headaches   . UTI (lower urinary tract infection)   . Sepsis   . Pneumonia     History reviewed. No pertinent past surgical history.  Family History  Problem Relation Age of Onset  . Drug abuse Father   . Drug abuse Sister   . Drug abuse Brother     History  Substance Use Topics  . Smoking status: Former Smoker -- 0.25 packs/day for 10 years    Types: Cigarettes  . Smokeless tobacco: Former Neurosurgeon     Comment: she is wanting to quit trying  . Alcohol Use: No     Comment: former  LAST TIME -MONDAY- BEER    Allergies:  Allergies  Allergen Reactions  . Toradol [Ketorolac Tromethamine] Itching    seizure  . Flexeril [Cyclobenzaprine] Other (See Comments)    seizure  . Gabapentin Other (See Comments)    seizures   . Tramadol Itching    Seizures  . Vicodin [Hydrocodone-Acetaminophen] Itching    Pt states she can take Tylenol without difficulty.  . Adhesive [Tape] Rash  . Keflet [Cephalexin] Rash  . Latex Rash  . Levaquin [Levofloxacin In D5w] Rash    Prescriptions prior to admission  Medication Sig Dispense Refill  . albuterol (PROVENTIL HFA;VENTOLIN HFA) 108 (90 BASE) MCG/ACT inhaler Inhale 2 puffs into the lungs every 6 (six)  hours as needed for wheezing or shortness of breath.       . carbamazepine (TEGRETOL XR) 200 MG 12 hr tablet Take 400 mg by mouth 2 (two) times daily.         Review of Systems  Constitutional: Negative for fever and chills.  Eyes: Negative for blurred vision, double vision and photophobia.  Respiratory: Negative for cough and shortness of breath.   Cardiovascular: Negative for chest pain and palpitations.  Gastrointestinal: Positive for heartburn and nausea. Negative for vomiting and abdominal pain.  Genitourinary: Negative for dysuria, urgency and frequency.   Musculoskeletal: Positive for back pain. Negative for neck pain.  Neurological: Positive for seizures and headaches. Negative for dizziness, tingling, tremors, weakness, numbness, paresthesias and loss of balance.  Psychiatric/Behavioral: Positive for depression.   Physical Exam   Blood pressure 106/83, pulse 98, temperature 98.3 F (36.8 C), temperature source Oral, resp. rate 18, height 5' (1.524 m), weight 71.668 kg (158 lb), last menstrual period 02/27/2014.  Physical Exam  Constitutional: She is oriented to person, place, and time. She appears well-developed and well-nourished. No distress.  HENT:  Head: Normocephalic and atraumatic.  Mouth/Throat: Oropharynx is clear and moist. No oropharyngeal exudate.  Eyes: EOM are normal. Right eye exhibits no discharge. Left eye exhibits no discharge. No scleral icterus.  Neck: No JVD present. No tracheal deviation present.  Cardiovascular: Normal rate, regular rhythm and normal heart sounds.  Exam reveals no gallop and no friction rub.   No murmur heard. Respiratory: Effort normal and breath sounds normal. No stridor. No respiratory distress. She has no wheezes. She has no rales. She exhibits no tenderness.  GI: Soft. Bowel sounds are normal. She exhibits no distension and no mass. There is no tenderness. There is no rebound and no guarding.  Musculoskeletal: Normal range of motion. She exhibits no edema and no tenderness.  Lymphadenopathy:    She has no cervical adenopathy.  Neurological: She is alert and oriented to person, place, and time. No cranial nerve deficit.  Skin: Skin is warm and dry.  Psychiatric: She has a normal mood and affect. Her behavior is normal. Judgment and thought content normal.   Results for orders placed during the hospital encounter of 03/08/14 (from the past 24 hour(s))  URINALYSIS, ROUTINE W REFLEX MICROSCOPIC     Status: Abnormal   Collection Time    03/08/14 10:13 AM      Result Value Ref Range   Color,  Urine YELLOW  YELLOW   APPearance HAZY (*) CLEAR   Specific Gravity, Urine 1.015  1.005 - 1.030   pH 6.0  5.0 - 8.0   Glucose, UA NEGATIVE  NEGATIVE mg/dL   Hgb urine dipstick NEGATIVE  NEGATIVE   Bilirubin Urine NEGATIVE  NEGATIVE   Ketones, ur NEGATIVE  NEGATIVE mg/dL   Protein, ur NEGATIVE  NEGATIVE mg/dL   Urobilinogen, UA 0.2  0.0 - 1.0 mg/dL   Nitrite POSITIVE (*) NEGATIVE   Leukocytes, UA TRACE (*) NEGATIVE  URINE MICROSCOPIC-ADD ON     Status: Abnormal   Collection Time    03/08/14 10:13 AM      Result Value Ref Range   Squamous Epithelial / LPF MANY (*) RARE   WBC, UA 21-50  <3 WBC/hpf   Bacteria, UA MANY (*) RARE  POCT PREGNANCY, URINE     Status: None   Collection Time    03/08/14 10:18 AM      Result Value Ref Range   Preg Test, Ur  NEGATIVE  NEGATIVE     MAU Course  Procedures  MDM UA results indicate the presence of a UTI which may be at least partially responsible for the back pain and the nausea.  Given the patient's polysubstance abuse history and many allergies to pain medications we will be conservative in treatment of her complaints today and suggest she access care from a primary care provider for regular care.  Assessment and Plan  UTI -Prescribed nitrofurantoin 100mg  po bid x5d - counseling re: frequent urination and awareness of UTI sx.  Migraine- will prescribe Imitrex 100 mg po prn migraine  Emerson MonteMertz, Ikechukwu Cerny 03/08/2014, 12:50 PM

## 2014-03-08 NOTE — Discharge Instructions (Signed)
Migraine Headache °A migraine headache is an intense, throbbing pain on one or both sides of your head. A migraine can last for 30 minutes to several hours. °CAUSES  °The exact cause of a migraine headache is not always known. However, a migraine may be caused when nerves in the brain become irritated and release chemicals that cause inflammation. This causes pain. °Certain things may also trigger migraines, such as: °· Alcohol. °· Smoking. °· Stress. °· Menstruation. °· Aged cheeses. °· Foods or drinks that contain nitrates, glutamate, aspartame, or tyramine. °· Lack of sleep. °· Chocolate. °· Caffeine. °· Hunger. °· Physical exertion. °· Fatigue. °· Medicines used to treat chest pain (nitroglycerine), birth control pills, estrogen, and some blood pressure medicines. °SIGNS AND SYMPTOMS °· Pain on one or both sides of your head. °· Pulsating or throbbing pain. °· Severe pain that prevents daily activities. °· Pain that is aggravated by any physical activity. °· Nausea, vomiting, or both. °· Dizziness. °· Pain with exposure to bright lights, loud noises, or activity. °· General sensitivity to bright lights, loud noises, or smells. °Before you get a migraine, you may get warning signs that a migraine is coming (aura). An aura may include: °· Seeing flashing lights. °· Seeing bright spots, halos, or zigzag lines. °· Having tunnel vision or blurred vision. °· Having feelings of numbness or tingling. °· Having trouble talking. °· Having muscle weakness. °DIAGNOSIS  °A migraine headache is often diagnosed based on: °· Symptoms. °· Physical exam. °· A CT scan or MRI of your head. These imaging tests cannot diagnose migraines, but they can help rule out other causes of headaches. °TREATMENT °Medicines may be given for pain and nausea. Medicines can also be given to help prevent recurrent migraines.  °HOME CARE INSTRUCTIONS °· Only take over-the-counter or prescription medicines for pain or discomfort as directed by your  health care provider. The use of long-term narcotics is not recommended. °· Lie down in a dark, quiet room when you have a migraine. °· Keep a journal to find out what may trigger your migraine headaches. For example, write down: °¨ What you eat and drink. °¨ How much sleep you get. °¨ Any change to your diet or medicines. °· Limit alcohol consumption. °· Quit smoking if you smoke. °· Get 7-9 hours of sleep, or as recommended by your health care provider. °· Limit stress. °· Keep lights dim if bright lights bother you and make your migraines worse. °SEEK IMMEDIATE MEDICAL CARE IF:  °· Your migraine becomes severe. °· You have a fever. °· You have a stiff neck. °· You have vision loss. °· You have muscular weakness or loss of muscle control. °· You start losing your balance or have trouble walking. °· You feel faint or pass out. °· You have severe symptoms that are different from your first symptoms. °MAKE SURE YOU:  °· Understand these instructions. °· Will watch your condition. °· Will get help right away if you are not doing well or get worse. °Document Released: 07/12/2005 Document Revised: 11/26/2013 Document Reviewed: 03/19/2013 °[employer]® Patient Information ©2015 [employer]. This information is not intended to replace advice given to you by your health care provider. Make sure you discuss any questions you have with your health care provider. ° °Urinary Tract Infection °Urinary tract infections (UTIs) can develop anywhere along your urinary tract. Your urinary tract is your body's drainage system for removing wastes and extra water. Your urinary tract includes two kidneys, two ureters, a bladder, and   urethra. Your kidneys are a pair of bean-shaped organs. Each kidney is about the size of your fist. They are located below your ribs, one on each side of your spine. CAUSES Infections are caused by microbes, which are microscopic organisms, including fungi, viruses, and bacteria. These organisms are so small  that they can only be seen through a microscope. Bacteria are the microbes that most commonly cause UTIs. SYMPTOMS  Symptoms of UTIs may vary by age and gender of the patient and by the location of the infection. Symptoms in young women typically include a frequent and intense urge to urinate and a painful, burning feeling in the bladder or urethra during urination. Older women and men are more likely to be tired, shaky, and weak and have muscle aches and abdominal pain. A fever may mean the infection is in your kidneys. Other symptoms of a kidney infection include pain in your back or sides below the ribs, nausea, and vomiting. DIAGNOSIS To diagnose a UTI, your caregiver will ask you about your symptoms. Your caregiver also will ask to provide a urine sample. The urine sample will be tested for bacteria and white blood cells. White blood cells are made by your body to help fight infection. TREATMENT  Typically, UTIs can be treated with medication. Because most UTIs are caused by a bacterial infection, they usually can be treated with the use of antibiotics. The choice of antibiotic and length of treatment depend on your symptoms and the type of bacteria causing your infection. HOME CARE INSTRUCTIONS  If you were prescribed antibiotics, take them exactly as your caregiver instructs you. Finish the medication even if you feel better after you have only taken some of the medication.  Drink enough water and fluids to keep your urine clear or pale yellow.  Avoid caffeine, tea, and carbonated beverages. They tend to irritate your bladder.  Empty your bladder often. Avoid holding urine for long periods of time.  Empty your bladder before and after sexual intercourse.  After a bowel movement, women should cleanse from front to back. Use each tissue only once. SEEK MEDICAL CARE IF:   You have back pain.  You develop a fever.  Your symptoms do not begin to resolve within 3 days. SEEK IMMEDIATE  MEDICAL CARE IF:   You have severe back pain or lower abdominal pain.  You develop chills.  You have nausea or vomiting.  You have continued burning or discomfort with urination. MAKE SURE YOU:   Understand these instructions.  Will watch your condition.  Will get help right away if you are not doing well or get worse. Document Released: 04/21/2005 Document Revised: 01/11/2012 Document Reviewed: 08/20/2011 [facility] Patient Information 2015 [place], [place]. This information is not intended to replace advice given to you by your health care provider. Make sure you discuss any questions you have with your health care provider.

## 2014-03-08 NOTE — MAU Note (Signed)
Urine in lab 

## 2014-03-08 NOTE — MAU Note (Addendum)
Same thing as before: ongoing nausea- comes and goes. (stomach feels weird).  On going headache. Wants to know what is going on. Did not do home test, had period. No vomiting or diarrhea- just  nauseated

## 2014-03-08 NOTE — MAU Note (Signed)
Patient presents for pregnancy test and with c/o of nausea, HA and back pain radiating to shoulders for past few weeks.  Denies emesis.  Denies vaginal bleeding, discharge.

## 2014-03-10 LAB — URINE CULTURE: Colony Count: >=100000

## 2014-03-11 ENCOUNTER — Telehealth: Payer: Self-pay | Admitting: *Deleted

## 2014-03-11 ENCOUNTER — Telehealth: Payer: Self-pay | Admitting: Neurology

## 2014-03-11 NOTE — Telephone Encounter (Signed)
Tegretrol XR  400 mg bid  # 120 called to pharmacy dosage clarified with patient

## 2014-03-11 NOTE — Telephone Encounter (Signed)
Error

## 2014-03-11 NOTE — Telephone Encounter (Signed)
Pt states that she does not think she is taking her medication correct and she is almost out it should have been a 3 month supply please call [phone]

## 2014-03-12 NOTE — MAU Provider Note (Signed)
I have reviewed and agreed with plan of care Delbert PhenixLinda M Camyah Pultz, NP

## 2014-03-18 ENCOUNTER — Telehealth: Payer: Self-pay | Admitting: Neurology

## 2014-03-18 ENCOUNTER — Other Ambulatory Visit: Payer: Medicare Other

## 2014-03-18 NOTE — Telephone Encounter (Signed)
Pt has no showed multiple EEG appts. Would you like to d/c from the practice? Please advise-Sherri

## 2014-03-20 NOTE — Telephone Encounter (Signed)
Pt has received a letter states that she would be discharged if she did not keep her appt she called to see when her appt was and was told the date and then said she would keep it due to the letter about noshows and discharged if this helps

## 2014-03-21 NOTE — Telephone Encounter (Signed)
She has had multiple no-shows, therefore she should be discharged from the clinic.

## 2014-03-21 NOTE — Telephone Encounter (Signed)
Pt no showed 03/18/14 appt for EEG. This was a final appt, if she no showed again, we were going to r/s. She has no showed MANY appts with our office. Do you want to discharge this pt and cancel future follow up? Please advise-Sherri

## 2014-03-27 ENCOUNTER — Encounter: Payer: Self-pay | Admitting: Neurology

## 2014-03-27 NOTE — Telephone Encounter (Signed)
Discharge letter prepped. Once Dr. Everlena Cooper signs letter, it will be forwarded to HIM at [facility] to be sent. Any future appts w/ our office have been cancelled / Sherri

## 2014-03-29 ENCOUNTER — Encounter (HOSPITAL_COMMUNITY): Payer: Self-pay | Admitting: Emergency Medicine

## 2014-03-29 ENCOUNTER — Telehealth: Payer: Self-pay | Admitting: Neurology

## 2014-03-29 ENCOUNTER — Emergency Department (HOSPITAL_COMMUNITY): Payer: Medicare Other

## 2014-03-29 ENCOUNTER — Emergency Department (HOSPITAL_COMMUNITY)
Admission: EM | Admit: 2014-03-29 | Discharge: 2014-03-29 | Disposition: A | Discharge status: Home / Self Care | Payer: Medicare Other | Attending: Emergency Medicine | Admitting: Emergency Medicine

## 2014-03-29 DIAGNOSIS — Z87440 Personal history of urinary (tract) infections: Secondary | ICD-10-CM | POA: Insufficient documentation

## 2014-03-29 DIAGNOSIS — Y92009 Unspecified place in unspecified non-institutional (private) residence as the place of occurrence of the external cause: Secondary | ICD-10-CM | POA: Insufficient documentation

## 2014-03-29 DIAGNOSIS — Z8669 Personal history of other diseases of the nervous system and sense organs: Secondary | ICD-10-CM | POA: Diagnosis not present

## 2014-03-29 DIAGNOSIS — S298XXA Other specified injuries of thorax, initial encounter: Secondary | ICD-10-CM | POA: Insufficient documentation

## 2014-03-29 DIAGNOSIS — F329 Major depressive disorder, single episode, unspecified: Secondary | ICD-10-CM | POA: Diagnosis not present

## 2014-03-29 DIAGNOSIS — S2239XA Fracture of one rib, unspecified side, initial encounter for closed fracture: Secondary | ICD-10-CM | POA: Insufficient documentation

## 2014-03-29 DIAGNOSIS — F411 Generalized anxiety disorder: Secondary | ICD-10-CM | POA: Diagnosis not present

## 2014-03-29 DIAGNOSIS — J4489 Other specified chronic obstructive pulmonary disease: Secondary | ICD-10-CM | POA: Diagnosis not present

## 2014-03-29 DIAGNOSIS — Z792 Long term (current) use of antibiotics: Secondary | ICD-10-CM | POA: Diagnosis not present

## 2014-03-29 DIAGNOSIS — J449 Chronic obstructive pulmonary disease, unspecified: Secondary | ICD-10-CM | POA: Insufficient documentation

## 2014-03-29 DIAGNOSIS — F17200 Nicotine dependence, unspecified, uncomplicated: Secondary | ICD-10-CM | POA: Insufficient documentation

## 2014-03-29 DIAGNOSIS — S2232XA Fracture of one rib, left side, initial encounter for closed fracture: Secondary | ICD-10-CM

## 2014-03-29 DIAGNOSIS — Z8619 Personal history of other infectious and parasitic diseases: Secondary | ICD-10-CM | POA: Diagnosis not present

## 2014-03-29 DIAGNOSIS — Y9389 Activity, other specified: Secondary | ICD-10-CM | POA: Insufficient documentation

## 2014-03-29 DIAGNOSIS — Z8701 Personal history of pneumonia (recurrent): Secondary | ICD-10-CM | POA: Diagnosis not present

## 2014-03-29 DIAGNOSIS — W108XXA Fall (on) (from) other stairs and steps, initial encounter: Secondary | ICD-10-CM | POA: Insufficient documentation

## 2014-03-29 DIAGNOSIS — Z91040 Latex allergy status: Secondary | ICD-10-CM | POA: Diagnosis not present

## 2014-03-29 DIAGNOSIS — F3289 Other specified depressive episodes: Secondary | ICD-10-CM | POA: Insufficient documentation

## 2014-03-29 DIAGNOSIS — G40909 Epilepsy, unspecified, not intractable, without status epilepticus: Secondary | ICD-10-CM | POA: Diagnosis not present

## 2014-03-29 MED ORDER — HYDROCODONE-ACETAMINOPHEN 5-325 MG PO TABS
1.0000 | ORAL_TABLET | Freq: Once | ORAL | Status: DC
  Filled 2014-03-29: qty 1

## 2014-03-29 MED ORDER — HYDROCODONE-ACETAMINOPHEN 5-325 MG PO TABS: 1.0000 | ORAL_TABLET | ORAL | Status: DC | PRN

## 2014-03-29 MED ORDER — MELOXICAM 15 MG PO TABS: 15.0000 mg | ORAL_TABLET | Freq: Every day | ORAL | Status: DC

## 2014-03-29 NOTE — Telephone Encounter (Signed)
Dismissal Letter sent by Certified Mail 03/29/2014  Dismissal Letter returned Unclaimed 07/08/2014  Dismissal Letter sent by 1st Class Mail 07/10/2014

## 2014-03-29 NOTE — ED Provider Notes (Signed)
Medical screening examination/treatment/procedure(s) were performed by non-physician practitioner and as supervising physician I was immediately available for consultation/collaboration.   EKG Interpretation None      Demontrez Rindfleisch, MD, FACEP   Georgian Mcclory L Cristine Daw, MD 03/29/14 2130 

## 2014-03-29 NOTE — ED Notes (Signed)
Pt not given Vicodin due to fact pt states it sometimes causes her to have seizures/  PA Theron Arista aware, and medication not given and returned to pyxis

## 2014-03-29 NOTE — Discharge Instructions (Signed)
Your x-rays showed a fracture to your 8th left eighth rib.  Please rest the area and followup with your primary care provider for continued evaluation and treatment.    Rib Fracture A rib fracture is a break or crack in one of the bones of the ribs. The ribs are a group of long, curved bones that wrap around your chest and attach to your spine. They protect your lungs and other organs in the chest cavity. A broken or cracked rib is often painful, but most do not cause other problems. Most rib fractures heal on their own over time. However, rib fractures can be more serious if multiple ribs are broken or if broken ribs move out of place and push against other structures. CAUSES   A direct blow to the chest. For example, this could happen during contact sports, a car accident, or a fall against a hard object.  Repetitive movements with high force, such as pitching a baseball or having severe coughing spells. SYMPTOMS   Pain when you breathe in or cough.  Pain when someone presses on the injured area. DIAGNOSIS  Your caregiver will perform a physical exam. Various imaging tests may be ordered to confirm the diagnosis and to look for related injuries. These tests may include a chest X-ray, computed tomography (CT), magnetic resonance imaging (MRI), or a bone scan. TREATMENT  Rib fractures usually heal on their own in 1-3 months. The longer healing period is often associated with a continued cough or other aggravating activities. During the healing period, pain control is very important. Medication is usually given to control pain. Hospitalization or surgery may be needed for more severe injuries, such as those in which multiple ribs are broken or the ribs have moved out of place.  HOME CARE INSTRUCTIONS   Avoid strenuous activity and any activities or movements that cause pain. Be careful during activities and avoid bumping the injured rib.  Gradually increase activity as directed by your  caregiver.  Only take over-the-counter or prescription medications as directed by your caregiver. Do not take other medications without asking your caregiver first.  Apply ice to the injured area for the first 1-2 days after you have been treated or as directed by your caregiver. Applying ice helps to reduce inflammation and pain.  Put ice in a plastic bag.  Place a towel between your skin and the bag.   Leave the ice on for 15-20 minutes at a time, every 2 hours while you are awake.  Perform deep breathing as directed by your caregiver. This will help prevent pneumonia, which is a common complication of a broken rib. Your caregiver may instruct you to:  Take deep breaths several times a day.  Try to cough several times a day, holding a pillow against the injured area.  Use a device called an incentive spirometer to practice deep breathing several times a day.  Drink enough fluids to keep your urine clear or pale yellow. This will help you avoid constipation.   Do not wear a rib belt or binder. These restrict breathing, which can lead to pneumonia.  SEEK IMMEDIATE MEDICAL CARE IF:   You have a fever.   You have difficulty breathing or shortness of breath.   You develop a continual cough, or you cough up thick or bloody sputum.  You feel sick to your stomach (nausea), throw up (vomit), or have abdominal pain.   You have worsening pain not controlled with medications.  MAKE SURE YOU:  Understand these instructions.  Will watch your condition.  Will get help right away if you are not doing well or get worse. Document Released: 07/12/2005 Document Revised: 03/14/2013 Document Reviewed: 09/13/2012 [facility] Patient Information 2015 [place], [place]. This information is not intended to replace advice given to you by your health care provider. Make sure you discuss any questions you have with your health care provider.

## 2014-03-29 NOTE — ED Notes (Signed)
Pt c/o L rib pain s/p seizure yesterday. Pt states she hit rib area on steps while having seizure

## 2014-03-29 NOTE — ED Provider Notes (Signed)
CSN: [id number]     Arrival date & time 03/29/14  1951 History   First MD Initiated Contact with Patient 03/29/14 2032    This chart was scribed for non-physician practitioner Ivonne Andrew, PA-C working with Ward Givens, MD, by Andrew Au, ED Scribe. This patient was seen in room WTR9/WTR9 and the patient's care was started at 8:48 PM. Chief Complaint  Patient presents with  . Rib Injury    The history is provided by the patient. No language interpreter was used.   Jacqueline Jones is a 23 y.o. female who presents to the Emergency Department complaining of left rib pain onset 1 day. Pt reports she fell walking up carpeted stairs inside her apartment yesterday morning and had a seizure. Pt reports loss of memory due to seizure.  She has been having left-sided rib pain since the fall. She only fell down 2-3 steps. Denies any head injury, back or neck pain. Pt reports she has been having seizure for 1 year but was diagnosed 4 months ago. She reports she was seen at [facility] neurology 4 months ago and was prescribed tegretol 2 months ago. She reports this medication has reduced seizure from 3-4 times a month to 1-2 times a month She reports seizures are induced by stress and anxiety. Pt takes tegretol twice a day 2 in the morning and 2 in the evening. Pt states she has 1 refill left of this medication. She has been taking this without missing any doses. She states the that she has been seen at [facility] once, missed her f/u appointment and is now unable to be seen again.  Due to fall pt c/o has left rib pain. She reports area is tender to touch and has pain with taking a deep breath.  Pt reports taking OTC medication without relief to pain. Pt denies broken ribs in the past.  Past Medical History  Diagnosis Date  . Asthma   . Anxiety   . Depression   . Anxiety   . Depression   . COPD (chronic obstructive pulmonary disease)   . Seizures     unknown cause- started 08/14  "monthly"  . Infection     UTI   . Frequent headaches   . UTI (lower urinary tract infection)   . Sepsis   . Pneumonia    History reviewed. No pertinent past surgical history. Family History  Problem Relation Age of Onset  . Drug abuse Father   . Drug abuse Sister   . Drug abuse Brother    History  Substance Use Topics  . Smoking status: Current Every Day Smoker -- 0.25 packs/day for 10 years    Types: Cigarettes  . Smokeless tobacco: Former Neurosurgeon     Comment: she is wanting to quit trying  . Alcohol Use: No     Comment: former  LAST TIME -MONDAY- BEER   OB History   Grav Para Term Preterm Abortions TAB SAB Ect Mult Living   0 0             Review of Systems  Respiratory: Negative for cough and shortness of breath.   Neurological: Negative for dizziness, light-headedness and headaches.  All other systems reviewed and are negative.   Allergies  Toradol; Flexeril; Gabapentin; Tramadol; Vicodin; Adhesive; Keflet; Latex; and Levaquin  Home Medications   Prior to Admission medications   Medication Sig Start Date End Date Taking? Authorizing Provider  albuterol (PROVENTIL HFA;VENTOLIN HFA) 108 (90 BASE) MCG/ACT inhaler Inhale  2 puffs into the lungs every 6 (six) hours as needed for wheezing or shortness of breath.     Historical Provider, MD  carbamazepine (TEGRETOL XR) 200 MG 12 hr tablet Take 400 mg by mouth 2 (two) times daily.     Historical Provider, MD  nitrofurantoin, macrocrystal-monohydrate, (MACROBID) 100 MG capsule Take 1 capsule (100 mg total) by mouth 2 (two) times daily. 03/08/14   Delbert Phenix, NP  SUMAtriptan (IMITREX) 100 MG tablet Take 1 tablet (100 mg total) by mouth once as needed for migraine. May repeat in 2 hours if headache persists or recurs. 03/08/14 03/08/15  Delbert Phenix, NP   Triage Vitals- BP 110/74  Pulse 87  Temp(Src) 98.4 F (36.9 C) (Oral)  Resp 16  Ht  (1.575 m)  Wt 161 lb (73.029 kg)  BMI 29.44 kg/m2  SpO2 99%  LMP 02/27/2014 Physical Exam  Nursing note  and vitals reviewed. Constitutional: She is oriented to person, place, and time. She appears well-developed and well-nourished. No distress.  HENT:  Head: Normocephalic and atraumatic.  Eyes: Conjunctivae are normal. Pupils are equal, round, and reactive to light.  Neck: Normal range of motion. Neck supple.  Cardiovascular: Normal rate and regular rhythm.   Pulmonary/Chest: Effort normal and breath sounds normal. No respiratory distress. She has no wheezes. She has no rales. She exhibits tenderness.  Old appearing bruise to the left lateral lower ribs and body. Patient with tenderness in the mid left rib area no gross deformity or crepitus.  Abdominal: Soft. There is no tenderness. There is no rebound and no guarding.  Neurological: She is alert and oriented to person, place, and time. She has normal strength. No cranial nerve deficit or sensory deficit. Gait normal.  Skin: Skin is warm and dry. No rash noted.  Psychiatric: She has a normal mood and affect. Her behavior is normal.    ED Course  Procedures   COORDINATION OF CARE:  Nursing notes reviewed. Vital signs reviewed. Initial pt interview and examination performed.   Filed Vitals:   03/29/14 2021 03/29/14 2026  BP: 110/74   Pulse: 87   Temp: 98.4 F (36.9 C)   TempSrc: Oral   Resp: 16   Height:   (1.575 m)  Weight:  161 lb (73.029 kg)  SpO2: 99%     9:00PM-patient seen and evaluated. Patient appears well does not appear in significant pain or discomfort. She reports having a seizure yesterday and as history of the same. She reports taking her Tegretol grade early as prescribed has not missed any doses. I discussed options for basic laboratory testing and urinalysis the patient refused. Normal nonfocal neuro exam. No other concerning symptoms or findings.  Patient initially offered Vicodin but states that this sometimes causes a seizure. She has allergies to several other pain medications. Does report taking Tylenol  Myotrophin at home prior to arrival. Also has history of narcotic abuse. Prescription for Mobic given. Incentive spirometer given. Patient instructed to follow up with PCP.  Treatment plan initiated: Medications  HYDROcodone-acetaminophen (NORCO/VICODIN) 5-325 MG per tablet 1 tablet (1 tablet Oral Not Given 03/29/14 2108)       Imaging Review Dg Ribs Unilateral W/chest Left  03/29/2014   CLINICAL DATA:  Left anterior rib pain, seizure  EXAM: LEFT RIBS AND CHEST - 3+ VIEW  COMPARISON:  07/14/2013  FINDINGS: Increased interstitial markings/bronchitic changes. No focal consolidation. No pleural effusion or pneumothorax.  The heart is normal in size.  Nondisplaced left  lateral 8th rib fracture.  IMPRESSION: Nondisplaced left lateral 8th rib fracture.   Electronically Signed   By: Charline Bills M.D.   On: 03/29/2014 20:54     MDM   Final diagnoses:  Rib fracture, left, closed, initial encounter    I personally performed the services described in this documentation, which was scribed in my presence. The recorded information has been reviewed and is accurate.      Angus Seller, PA-C 03/29/14 2114

## 2014-04-29 ENCOUNTER — Ambulatory Visit: Payer: Medicare Other | Admitting: Internal Medicine

## 2014-04-29 DIAGNOSIS — Z0289 Encounter for other administrative examinations: Secondary | ICD-10-CM

## 2014-05-06 ENCOUNTER — Ambulatory Visit: Payer: Medicare Other | Admitting: Internal Medicine

## 2014-05-08 ENCOUNTER — Encounter (HOSPITAL_COMMUNITY): Payer: Self-pay

## 2014-05-08 ENCOUNTER — Inpatient Hospital Stay (HOSPITAL_COMMUNITY)
Admission: AD | Admit: 2014-05-08 | Discharge: 2014-05-08 | Disposition: A | Discharge status: Home / Self Care | Payer: Medicare Other | Source: Ambulatory Visit | Attending: Obstetrics & Gynecology | Admitting: Obstetrics & Gynecology

## 2014-05-08 ENCOUNTER — Ambulatory Visit: Payer: Medicare Other | Admitting: Neurology

## 2014-05-08 DIAGNOSIS — N390 Urinary tract infection, site not specified: Secondary | ICD-10-CM | POA: Diagnosis not present

## 2014-05-08 DIAGNOSIS — F17210 Nicotine dependence, cigarettes, uncomplicated: Secondary | ICD-10-CM | POA: Insufficient documentation

## 2014-05-08 DIAGNOSIS — L739 Follicular disorder, unspecified: Secondary | ICD-10-CM | POA: Diagnosis not present

## 2014-05-08 DIAGNOSIS — M549 Dorsalgia, unspecified: Secondary | ICD-10-CM | POA: Diagnosis present

## 2014-05-08 LAB — URINALYSIS, ROUTINE W REFLEX MICROSCOPIC
Bilirubin Urine: NEGATIVE
GLUCOSE, UA: NEGATIVE mg/dL
Ketones, ur: NEGATIVE mg/dL
Nitrite: NEGATIVE
Protein, ur: NEGATIVE mg/dL
Specific Gravity, Urine: 1.02 (ref 1.005–1.030)
Urobilinogen, UA: 0.2 mg/dL (ref 0.0–1.0)
pH: 6.5 (ref 5.0–8.0)

## 2014-05-08 LAB — POCT PREGNANCY, URINE: Preg Test, Ur: NEGATIVE

## 2014-05-08 LAB — URINE MICROSCOPIC-ADD ON

## 2014-05-08 MED ORDER — SULFAMETHOXAZOLE-TRIMETHOPRIM 800-160 MG PO TABS: 1.0000 | ORAL_TABLET | Freq: Two times a day (BID) | ORAL | Status: AC

## 2014-05-08 NOTE — MAU Provider Note (Signed)
History     CSN: [id number]  Arrival date and time: 05/08/14 1538   First Provider Initiated Contact with Patient 05/08/14 1702      Chief Complaint  Patient presents with  . Possible Pregnancy  . Back Pain  . Dysuria  . Mass   HPI  [name] is a 23 y.o. female G0P0 who presents to MAU with symptoms of a UTI along with concerns regarding possible pregnancy. She has had burning and aching in her kidneys for the past 2-3 days.  Denies burning when she pees, denies hematuria. History of UTI's. She is also concerned about a bump she has in her right armpit. It has been there for a few days and is very painful to touch; she does attest to shaving in that area.   OB History   Grav Para Term Preterm Abortions TAB SAB Ect Mult Living   0 0              Past Medical History  Diagnosis Date  . Asthma   . Anxiety   . Depression   . Anxiety   . Depression   . COPD (chronic obstructive pulmonary disease)   . Seizures     unknown cause- started 08/14  "monthly"  . Infection     UTI  . Frequent headaches   . UTI (lower urinary tract infection)   . Sepsis   . Pneumonia     History reviewed. No pertinent past surgical history.  Family History  Problem Relation Age of Onset  . Drug abuse Father   . Drug abuse Sister   . Drug abuse Brother     History  Substance Use Topics  . Smoking status: Current Every Day Smoker -- 0.25 packs/day for 10 years    Types: Cigarettes  . Smokeless tobacco: Former NeurosurgeonUser     Comment: she is wanting to quit trying  . Alcohol Use: No     Comment: former  LAST TIME -MONDAY- BEER    Allergies:  Allergies  Allergen Reactions  . Toradol [Ketorolac Tromethamine] Itching    seizure  . Flexeril [Cyclobenzaprine] Other (See Comments)    seizure  . Gabapentin Other (See Comments)    seizures   . Tramadol Itching    Seizures  . Vicodin [Hydrocodone-Acetaminophen] Itching    Pt states she can take Tylenol without difficulty.  .  Adhesive [Tape] Rash  . Keflet [Cephalexin] Rash  . Latex Rash  . Levaquin [Levofloxacin In D5w] Rash    Prescriptions prior to admission  Medication Sig Dispense Refill  . carbamazepine (TEGRETOL XR) 200 MG 12 hr tablet Take 400 mg by mouth 2 (two) times daily.       Marland Kitchen. albuterol (PROVENTIL HFA;VENTOLIN HFA) 108 (90 BASE) MCG/ACT inhaler Inhale 2 puffs into the lungs every 6 (six) hours as needed for wheezing or shortness of breath.        Results for orders placed during the hospital encounter of 05/08/14 (from the past 48 hour(s))  URINALYSIS, ROUTINE W REFLEX MICROSCOPIC     Status: Abnormal   Collection Time    05/08/14  4:00 PM      Result Value Ref Range   Color, Urine YELLOW  YELLOW   APPearance HAZY (*) CLEAR   Specific Gravity, Urine 1.020  1.005 - 1.030   pH 6.5  5.0 - 8.0   Glucose, UA NEGATIVE  NEGATIVE mg/dL   Hgb urine dipstick TRACE (*) NEGATIVE   Bilirubin  Urine NEGATIVE  NEGATIVE   Ketones, ur NEGATIVE  NEGATIVE mg/dL   Protein, ur NEGATIVE  NEGATIVE mg/dL   Urobilinogen, UA 0.2  0.0 - 1.0 mg/dL   Nitrite NEGATIVE  NEGATIVE   Leukocytes, UA MODERATE (*) NEGATIVE  URINE MICROSCOPIC-ADD ON     Status: Abnormal   Collection Time    05/08/14  4:00 PM      Result Value Ref Range   Squamous Epithelial / LPF FEW (*) RARE   WBC, UA 21-50  <3 WBC/hpf   RBC / HPF 0-2  <3 RBC/hpf   Urine-Other AMORPHOUS URATES/PHOSPHATES    POCT PREGNANCY, URINE     Status: None   Collection Time    05/08/14  4:06 PM      Result Value Ref Range   Preg Test, Ur NEGATIVE  NEGATIVE   Comment:            THE SENSITIVITY OF THIS     METHODOLOGY IS >24 mIU/mL     Review of Systems  Constitutional: Positive for chills. Negative for fever.  Gastrointestinal: Positive for nausea, vomiting and abdominal pain (Middle- lower abdominal pain ).  Musculoskeletal: Positive for back pain (Bilateral lower back pain ).   Physical Exam   Blood pressure 117/75, pulse 87, temperature 98.8 F  (37.1 C), temperature source Oral, resp. rate 16, height 5' (1.524 m), weight 77.021 kg (169 lb 12.8 oz), last menstrual period 04/09/2014, SpO2 99.00%.  Physical Exam  Constitutional: She is oriented to person, place, and time. She appears well-developed and well-nourished. No distress.  HENT:  Head: Normocephalic.  Eyes: Pupils are equal, round, and reactive to light.  Neck: Neck supple.  Respiratory: Effort normal.  GI: There is generalized tenderness. There is no CVA tenderness.  Musculoskeletal: Normal range of motion.  Neurological: She is alert and oriented to person, place, and time.  Skin: Skin is warm and dry. She is not diaphoretic.  Tender nodule in the center of patient's right axilla.  Erythema to surrounding area.   Psychiatric: Her behavior is normal.    MAU Course  Procedures None  MDM Culture pending (Urine)  Assessment and Plan   A: 1. UTI (lower urinary tract infection)   2. Folliculitis    P: Discharge home in stable condition RX: Bactrim Return to MAU if folliculitis does not improve Warm compresses to affected area At home pregnancy tests encouraged   Iona HansenJennifer Irene Shalimar Mcclain, NP 05/08/2014 6:00 PM

## 2014-05-08 NOTE — MAU Note (Signed)
Patient states she has been having pain and burning with urination for a couple of days. States she has a history of UTI and kidney infections. Has a small lump under the right arm that is painful for a least 2 days. Denies bleeding or discharge.

## 2014-05-10 LAB — URINE CULTURE
Colony Count: >=100000
SPECIAL REQUESTS: NORMAL

## 2014-05-11 NOTE — MAU Provider Note (Signed)
Attestation of Attending Supervision of Advanced Practitioner (PA/CNM/NP): Evaluation and management procedures were performed by the Advanced Practitioner under my supervision and collaboration.  I have reviewed the Advanced Practitioner's note and chart, and I agree with the management and plan.  Draken Farrior, MD, FACOG Attending Obstetrician & Gynecologist Faculty Practice, [facility]   

## 2014-05-13 ENCOUNTER — Ambulatory Visit (INDEPENDENT_AMBULATORY_CARE_PROVIDER_SITE_OTHER): Payer: Medicare Other | Admitting: Internal Medicine

## 2014-05-13 ENCOUNTER — Encounter: Payer: Self-pay | Admitting: Internal Medicine

## 2014-05-13 VITALS — BP 132/90 | HR 110 | Temp 97.4°F | Resp 20 | Ht 62.0 in | Wt 170.8 lb

## 2014-05-13 DIAGNOSIS — Z9119 Patient's noncompliance with other medical treatment and regimen: Secondary | ICD-10-CM

## 2014-05-13 DIAGNOSIS — L739 Follicular disorder, unspecified: Secondary | ICD-10-CM

## 2014-05-13 DIAGNOSIS — Z91199 Patient's noncompliance with other medical treatment and regimen due to unspecified reason: Secondary | ICD-10-CM | POA: Insufficient documentation

## 2014-05-13 DIAGNOSIS — R29818 Other symptoms and signs involving the nervous system: Secondary | ICD-10-CM | POA: Insufficient documentation

## 2014-05-13 DIAGNOSIS — F1920 Other psychoactive substance dependence, uncomplicated: Secondary | ICD-10-CM

## 2014-05-13 DIAGNOSIS — F411 Generalized anxiety disorder: Secondary | ICD-10-CM

## 2014-05-13 MED ORDER — PAROXETINE HCL 20 MG PO TABS: 20.0000 mg | ORAL_TABLET | Freq: Every day | ORAL | Status: DC

## 2014-05-13 MED ORDER — NAPROXEN 500 MG PO TABS: 500.0000 mg | ORAL_TABLET | Freq: Two times a day (BID) | ORAL | Status: DC

## 2014-05-13 MED ORDER — CARBAMAZEPINE ER 400 MG PO TB12: 400.0000 mg | ORAL_TABLET | Freq: Two times a day (BID) | ORAL | Status: DC

## 2014-05-13 NOTE — Assessment & Plan Note (Signed)
Patient states she gets more seizures on Xanax and has not done well with Klonopin in the past. Advised her that these are not good medicines for generalized anxiety disorder. Will start paroxetine 20 mg daily. She will call us in 3 weeks with progress report and she'll be seen back in the office in 6-8 weeks.

## 2014-05-13 NOTE — Assessment & Plan Note (Signed)
Patient denies current marijuana or other drug use. She is still smoking and reminded her of the medical harm with her history of asthma.

## 2014-05-13 NOTE — Assessment & Plan Note (Signed)
Per patient she has seizure disorder however review of neurology notes would suggest that they're unclear whether she has seizure disorder or not. She did not followup with MRI or EEG. At this point we'll refill her Tegretol for 2 months and have advised her that she needs to see a neurologist and follow through with the workup of this. Advised her that she should not be trying to get pregnant while taking this medication and reminded her that condoms are a good form of contraception and should be used every time she is having sex.

## 2014-05-13 NOTE — Assessment & Plan Note (Signed)
Under right armpit, advised no shaving in the area, warm compress usage to 3 times daily. Advised naproxen for inflammation/pain. At this time not a candidate for I&D as there is no fluctuance in the area merely inflammation. She is currently taking this Bactrim for UTI do not feel she needs further antibiotics. If the area is worsening or not resolving she will call our office back.

## 2014-05-13 NOTE — Patient Instructions (Signed)
We will see you back in about 6 weeks to check on your mood to see if the anxiety is getting better.   We would like you to try a different neurologist in town because I am not sure that you need the seizure medicine and they may need to adjust the levels if you do need it. Their name is [facility]: [address] [place], [place] [address] Tel: [phone]  YOU SHOULD NOT get pregnant on this seizure medication and I strongly recommend using condoms as birth control every time you have sex. The seizure medicine can cause birth defects in the baby if you do get pregnant.   The anxiety medicine is called paroxetine. Take 1 pill per day to help with the anxiety. It may take up to 4 weeks for it to fully help so do not stop taking the medicine even if you do not feel like it is helping until you come back. We can increase the dose if it is helping some but not enough, please call us for our recommendations.   For the area under your arm this is not ready yet for anything. We would advise using a warm washcloth with it 2-3 times per day to help it to heal. The antibiotics will help it as well. Take naproxen 1 pill twice a day for the next 1 week to help decrease the swelling and help it to resolve.   Generalized Anxiety Disorder Generalized anxiety disorder (GAD) is a mental disorder. It interferes with life functions, including relationships, work, and school. GAD is different from normal anxiety, which everyone experiences at some point in their lives in response to specific life events and activities. Normal anxiety actually helps us prepare for and get through these life events and activities. Normal anxiety goes away after the event or activity is over.  GAD causes anxiety that is not necessarily related to specific events or activities. It also causes excess anxiety in proportion to specific events or activities. The anxiety associated with GAD is also difficult to  control. GAD can vary from mild to severe. People with severe GAD can have intense waves of anxiety with physical symptoms (panic attacks).  SYMPTOMS The anxiety and worry associated with GAD are difficult to control. This anxiety and worry are related to many life events and activities and also occur more days than not for 6 months or longer. People with GAD also have three or more of the following symptoms (one or more in children):  Restlessness.   Fatigue.  Difficulty concentrating.   Irritability.  Muscle tension.  Difficulty sleeping or unsatisfying sleep. DIAGNOSIS GAD is diagnosed through an assessment by your health care provider. Your health care provider will ask you questions aboutyour mood,physical symptoms, and events in your life. Your health care provider may ask you about your medical history and use of alcohol or drugs, including prescription medicines. Your health care provider may also do a physical exam and blood tests. Certain medical conditions and the use of certain substances can cause symptoms similar to those associated with GAD. Your health care provider may refer you to a mental health specialist for further evaluation. TREATMENT The following therapies are usually used to treat GAD:   Medication. Antidepressant medication usually is prescribed for long-term daily control. Antianxiety medicines may be added in severe cases, especially when panic attacks occur.   Talk therapy (psychotherapy). Certain types of talk therapy can be helpful in treating GAD by providing support, education,  and guidance. A form of talk therapy called cognitive behavioral therapy can teach you healthy ways to think about and react to daily life events and activities.  Stress managementtechniques. These include yoga, meditation, and exercise and can be very helpful when they are practiced regularly. A mental health specialist can help determine which treatment is best for you. Some  people see improvement with one therapy. However, other people require a combination of therapies. Document Released: 11/06/2012 Document Revised: 11/26/2013 Document Reviewed: 11/06/2012 [facility] Patient Information 2015 [employer] [place]. This information is not intended to replace advice given to you by your health care provider. Make sure you discuss any questions you have with your health care provider.

## 2014-05-13 NOTE — Assessment & Plan Note (Signed)
Prior to this visit patient has 2 no-shows/reschedule as at our clinic in the last month. Will monitor closely as she has history of no showing to appointments with neurology.

## 2014-05-13 NOTE — Progress Notes (Signed)
   Subjective:    Patient ID: Jacqueline Jones, female    DOB: 1991/04/17, 23 y.o.   MRN: [id number]  HPI The patient is a 23 year old female who is coming in today to establish care. She states that she needs refills of her seizure medication. She started having seizures per her several years ago and has only been on seizure medication for about 2-3 months. However she has stopped following up with neurology and due to multiple no shows visit she is unable to return to their office. She states that she has not had a seizure since being on seizure medication and she is trying to get pregnant at this time however she feels that she is unable to get. She also has quite a bit of anxiety and panic disorder. She is not currently driving and states that she did not feel comfortable driving even before she had her seizure disorder. She states that she has not had a doctor for some time before now. She states that she also has past medical history of asthma although she does not take anything for it except occasional albuterol several times per year. She denies chest pain, shortness of breath, abdominal pain, diarrhea. She states she has irritable to help which is manifested by constipation which is relieved couple juice or coffee. She also has an area on her arm pit which she is concerned about and would like me to look at.  Review of Systems  Constitutional: Positive for fatigue. Negative for fever, chills, activity change, appetite change and unexpected weight change.  Respiratory: Negative for cough, chest tightness, shortness of breath and wheezing.   Cardiovascular: Negative for chest pain, palpitations and leg swelling.  Gastrointestinal: Positive for constipation. Negative for abdominal pain, diarrhea, blood in stool and abdominal distention.  Genitourinary: Negative for dysuria, flank pain and difficulty urinating.  Musculoskeletal: Negative.   Skin: Positive for rash.  Neurological: Negative for  dizziness, seizures, facial asymmetry, weakness, light-headedness and headaches.  Psychiatric/Behavioral: Positive for sleep disturbance, dysphoric mood and agitation. Negative for suicidal ideas and self-injury. The patient is nervous/anxious.       Objective:   Physical Exam  Vitals reviewed. Constitutional: She appears well-developed and well-nourished.  Overweight, smelling of smoke.  HENT:  Head: Normocephalic and atraumatic.  Eyes: EOM are normal.  Neck: Normal range of motion.  Cardiovascular: Normal rate and regular rhythm.   Pulmonary/Chest: Effort normal and breath sounds normal. No respiratory distress. She has no wheezes. She has no rales.  Abdominal: Soft. Bowel sounds are normal. She exhibits no distension. There is no tenderness. There is no rebound.  Neurological: She is alert. Coordination normal.  Skin: Skin is warm and dry.  Area 1 cm x 3 mm under her right armpit mildly reduced.Melvia Heaps. Underneath is slightly hard, not fluctuant. Not a candidate for I&D at this time   Filed Vitals:   05/13/14 0952  BP: 132/90  Pulse: 110  Temp: 97.4 F (36.3 C)  TempSrc: Oral  Resp: 20  Height: 5\' 2"  (1.575 m)  Weight: 170 lb 12.8 oz (77.474 kg)  SpO2: 97%      Assessment & Plan:  Patient declines flu shot.

## 2014-06-10 ENCOUNTER — Telehealth: Payer: Self-pay | Admitting: Geriatric Medicine

## 2014-06-10 NOTE — Telephone Encounter (Signed)
-----   Message from Luna Glasgowheryl A Drake sent at 06/10/2014  9:15 AM EST ----- Regarding: pt requests call from CMA regarding medicine Contact: [phone] Amy,  Pt requests a call from you regarding her meds, thx!  Elnita Maxwellheryl

## 2014-06-10 NOTE — Telephone Encounter (Signed)
Left message for patient to call me back. 

## 2014-06-18 ENCOUNTER — Ambulatory Visit: Payer: Medicare Other | Admitting: Internal Medicine

## 2014-06-24 ENCOUNTER — Ambulatory Visit: Payer: Medicare Other | Admitting: Internal Medicine

## 2015-07-22 IMAGING — CT CT ABD-PELV W/O CM
1 series · 14 of 17 positions shown, 20 images · non-contrast
Comparison: None.

CLINICAL DATA: Pelvic and flank pain. Urolithiasis.

EXAM:
CT ABDOMEN AND PELVIS WITHOUT CONTRAST
TECHNIQUE: Multidetector CT imaging of the abdomen and pelvis was performed
following the standard protocol without intravenous contrast.

[Series 6: lung · axial · 0.67mm/px · z∈[+75,+145]mm · 14 of 17 slices shown, 20 images]
[im 2/17  soft-tissue]
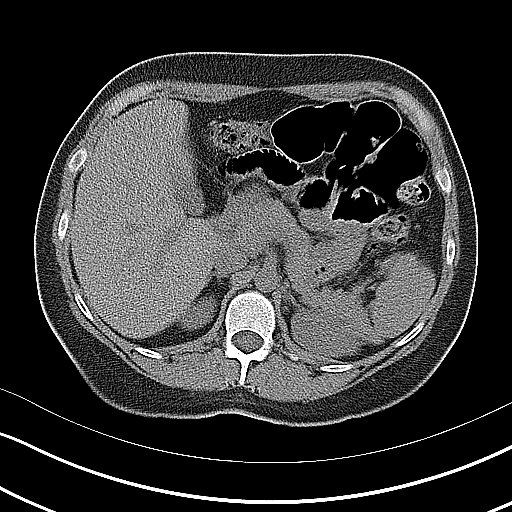
[im 2/17  bone]
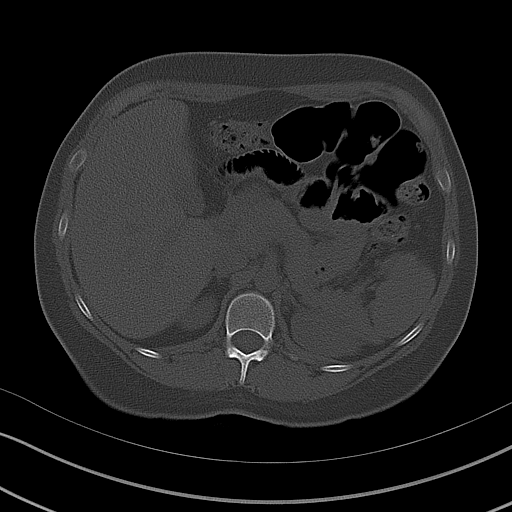
[im 3/17  soft-tissue]
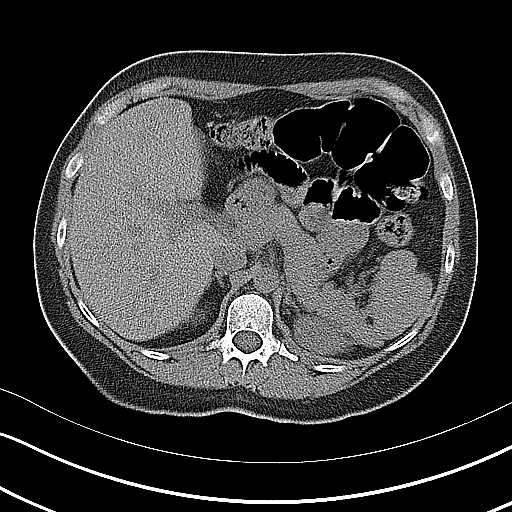
[im 4/17  soft-tissue]
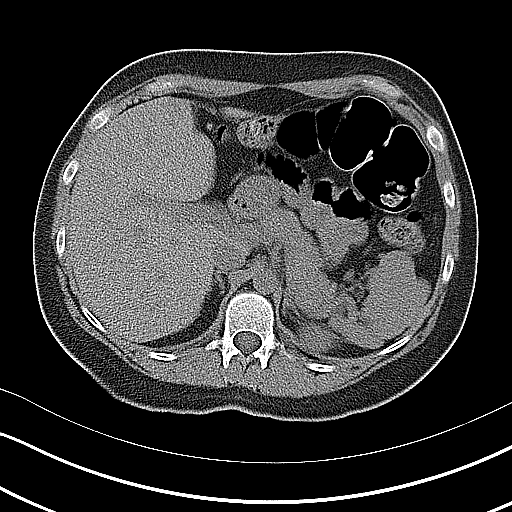
[im 5/17  soft-tissue]
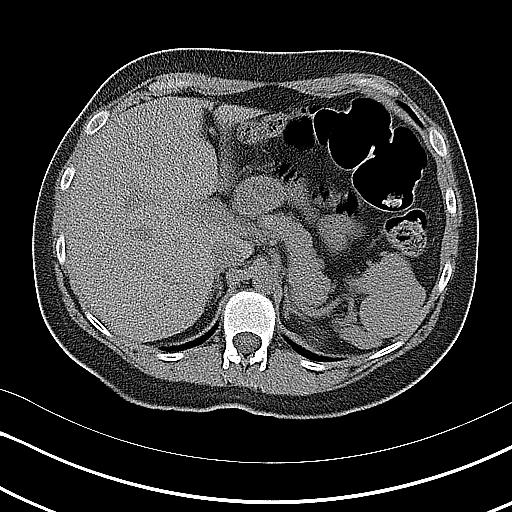
[im 6/17  soft-tissue]
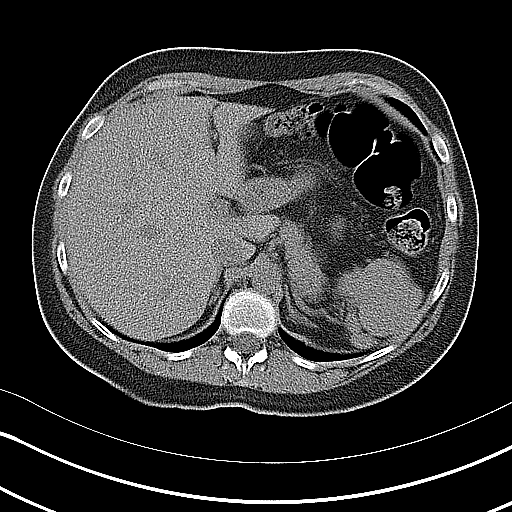
[im 7/17  soft-tissue]
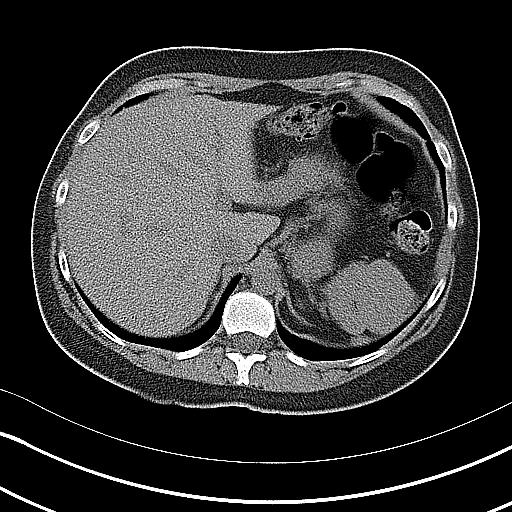
[im 8/17  soft-tissue]
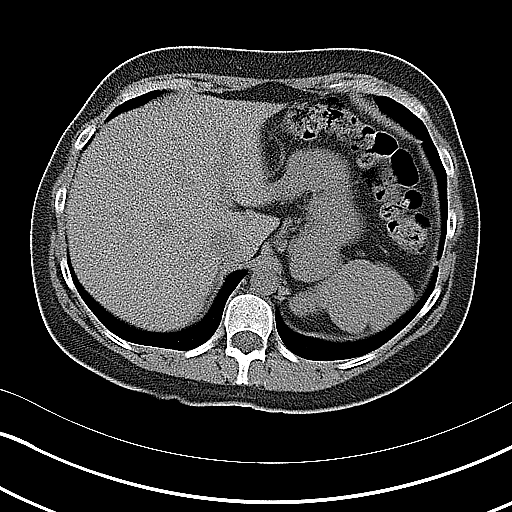
[im 10/17  soft-tissue]
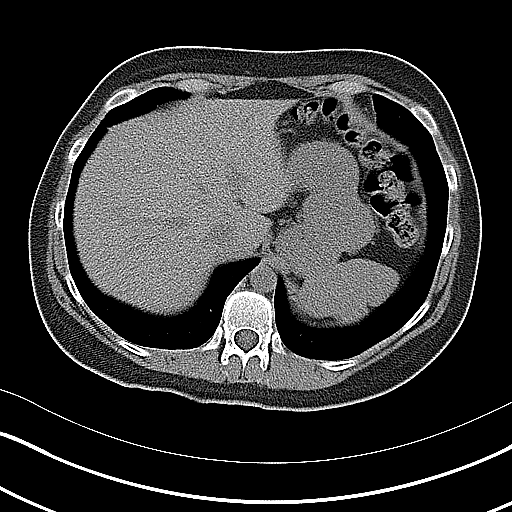
[im 11/17  soft-tissue]
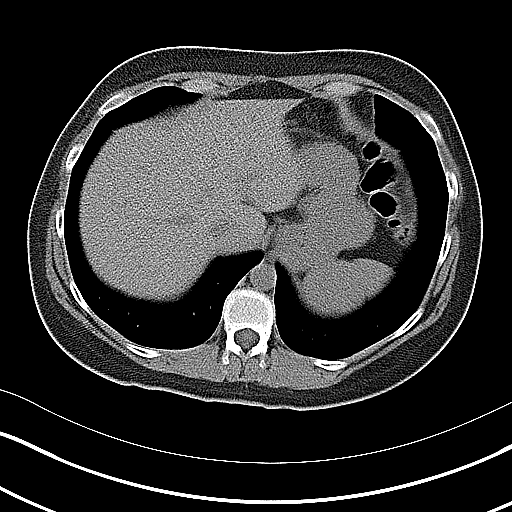
[im 11/17  bone]
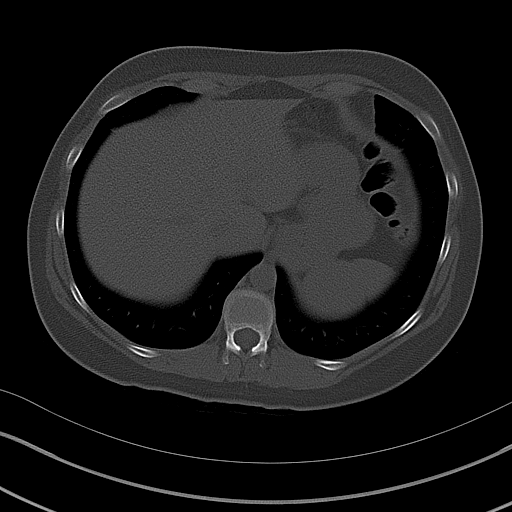
[im 12/17  soft-tissue]
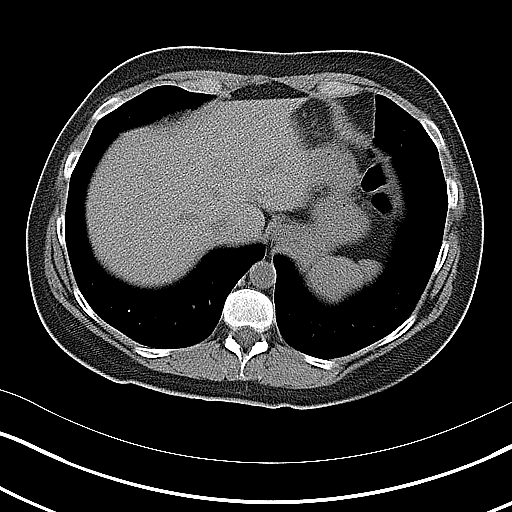
[im 13/17  soft-tissue]
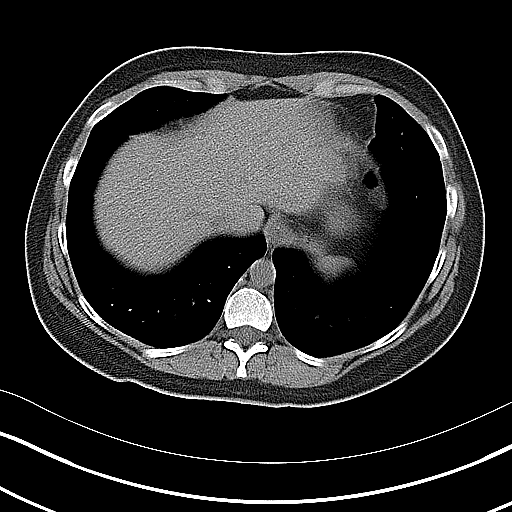
[im 13/17  lung]
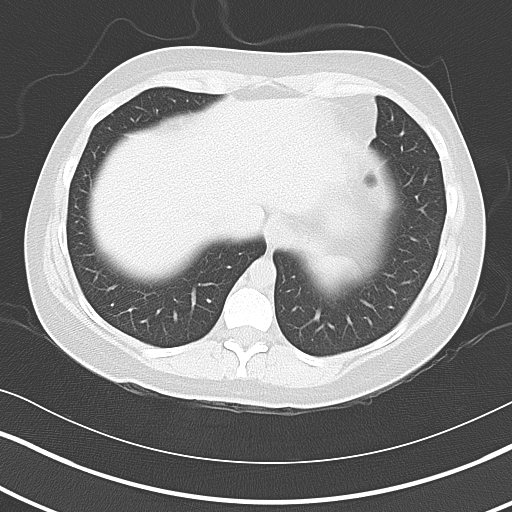
[im 14/17  soft-tissue]
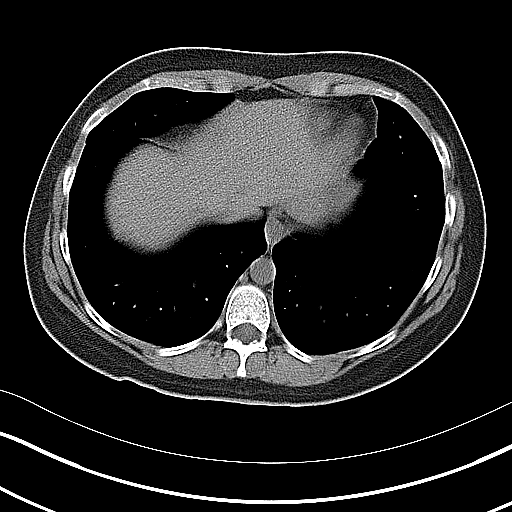
[im 14/17  lung]
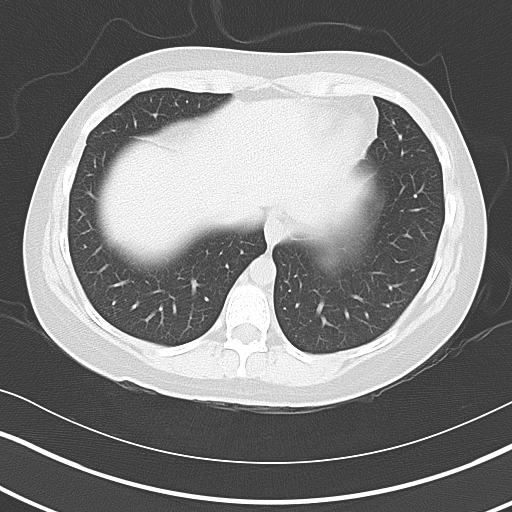
[im 15/17  soft-tissue]
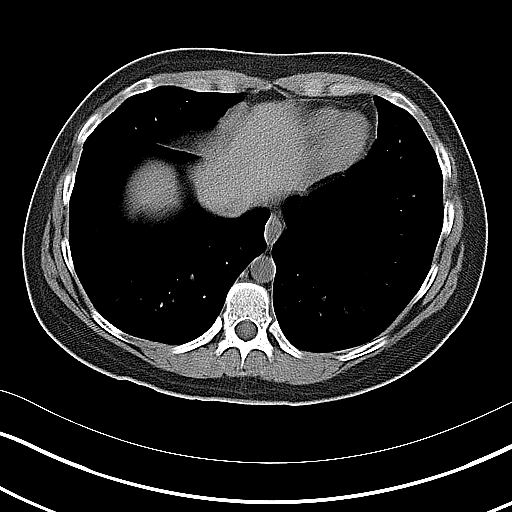
[im 15/17  lung]
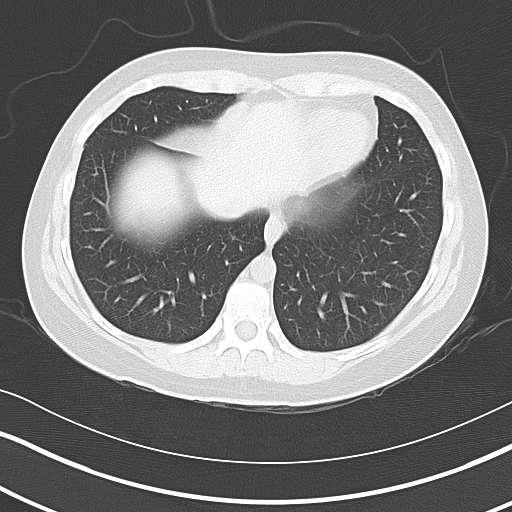
[im 16/17  soft-tissue]
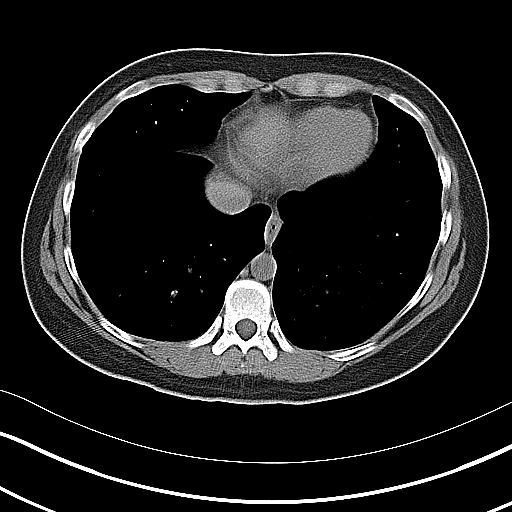
[im 16/17  lung]
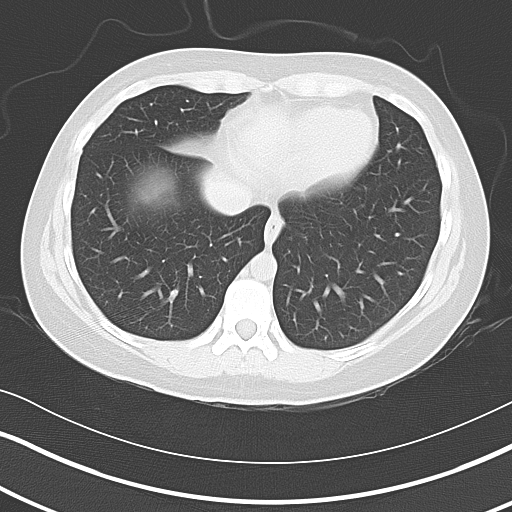

[14 of 17 positions shown; findings below may reference images not displayed]

FINDINGS: Tiny less than 5 mm nonobstructive renal calculi are seen
bilaterally. No evidence of hydronephrosis. No evidence of ureteral
calculi or dilatation. No bladder calculi identified.

Noncontrast images of the liver, pancreas, spleen, and adrenal
glands are unremarkable. No soft tissue masses or lymphadenopathy
identified. No evidence of inflammatory process or abnormal fluid
collections.

Large colonic stool burden is noted in the rectum is markedly
distended by stool.
IMPRESSION: Bilateral nonobstructive nephrolithiasis. No evidence of ureteral
calculi, hydronephrosis, or other acute findings.

Large stool burden, with the rectum markedly distended with stool.
Fecal impaction cannot be excluded.

## 2017-02-24 NOTE — Telephone Encounter (Signed)
See note

## 2019-09-10 ENCOUNTER — Telehealth: Payer: Self-pay | Admitting: Internal Medicine

## 2019-09-13 ENCOUNTER — Ambulatory Visit: Payer: Medicare Other | Admitting: Nurse Practitioner

## 2019-09-26 ENCOUNTER — Ambulatory Visit: Payer: Medicare Other | Admitting: Nurse Practitioner

## 2019-09-30 ENCOUNTER — Emergency Department (HOSPITAL_COMMUNITY): Payer: Medicaid Other

## 2019-09-30 ENCOUNTER — Emergency Department (HOSPITAL_COMMUNITY)
Admission: EM | Admit: 2019-09-30 | Discharge: 2019-09-30 | Disposition: A | Discharge status: Home / Self Care | Payer: Medicaid Other | Attending: Emergency Medicine | Admitting: Emergency Medicine

## 2019-09-30 ENCOUNTER — Encounter (HOSPITAL_COMMUNITY): Payer: Self-pay | Admitting: *Deleted

## 2019-09-30 DIAGNOSIS — Z79899 Other long term (current) drug therapy: Secondary | ICD-10-CM | POA: Diagnosis not present

## 2019-09-30 DIAGNOSIS — R079 Chest pain, unspecified: Secondary | ICD-10-CM | POA: Diagnosis present

## 2019-09-30 DIAGNOSIS — J45909 Unspecified asthma, uncomplicated: Secondary | ICD-10-CM | POA: Diagnosis not present

## 2019-09-30 DIAGNOSIS — Z91040 Latex allergy status: Secondary | ICD-10-CM | POA: Diagnosis not present

## 2019-09-30 DIAGNOSIS — Z87891 Personal history of nicotine dependence: Secondary | ICD-10-CM | POA: Insufficient documentation

## 2019-09-30 DIAGNOSIS — R0789 Other chest pain: Secondary | ICD-10-CM | POA: Diagnosis not present

## 2019-09-30 DIAGNOSIS — R1114 Bilious vomiting: Secondary | ICD-10-CM | POA: Diagnosis not present

## 2019-09-30 DIAGNOSIS — F419 Anxiety disorder, unspecified: Secondary | ICD-10-CM

## 2019-09-30 LAB — BASIC METABOLIC PANEL
Anion gap: 16 — ABNORMAL HIGH (ref 5–15)
BUN: 8 mg/dL (ref 6–20)
CO2: 20 mmol/L — ABNORMAL LOW (ref 22–32)
Calcium: 9.5 mg/dL (ref 8.9–10.3)
Chloride: 106 mmol/L (ref 98–111)
Creatinine, Ser: 0.75 mg/dL (ref 0.44–1.00)
GFR calc Af Amer: >60 mL/min (ref >60)
GFR calc non Af Amer: >60 mL/min (ref >60)
Glucose, Bld: 161 mg/dL — ABNORMAL HIGH (ref 70–99)
Potassium: 4.3 mmol/L (ref 3.5–5.1)
Sodium: 142 mmol/L (ref 135–145)

## 2019-09-30 LAB — HEPATIC FUNCTION PANEL
ALT: 90 U/L — ABNORMAL HIGH (ref 0–44)
AST: 79 U/L — ABNORMAL HIGH (ref 15–41)
Albumin: 4.3 g/dL (ref 3.5–5.0)
Alkaline Phosphatase: 109 U/L (ref 38–126)
Bilirubin, Direct: 0.1 mg/dL (ref 0.0–0.2)
Indirect Bilirubin: 0.5 mg/dL (ref 0.3–0.9)
Total Bilirubin: 0.6 mg/dL (ref 0.3–1.2)
Total Protein: 8.5 g/dL — ABNORMAL HIGH (ref 6.5–8.1)

## 2019-09-30 LAB — TROPONIN I (HIGH SENSITIVITY)
Troponin I (High Sensitivity): <2 ng/L (ref <18)
Troponin I (High Sensitivity): <2 ng/L (ref <18)

## 2019-09-30 LAB — D-DIMER, QUANTITATIVE: D-Dimer, Quant: 0.61 ug/mL-FEU — ABNORMAL HIGH (ref 0.00–0.50)

## 2019-09-30 LAB — URINALYSIS, ROUTINE W REFLEX MICROSCOPIC
Bilirubin Urine: NEGATIVE
Glucose, UA: NEGATIVE mg/dL
Hgb urine dipstick: NEGATIVE
Ketones, ur: 80 mg/dL — AB
Leukocytes,Ua: NEGATIVE
Nitrite: POSITIVE — AB
Protein, ur: 30 mg/dL — AB
Specific Gravity, Urine: 1.021 (ref 1.005–1.030)
pH: 5 (ref 5.0–8.0)

## 2019-09-30 LAB — CBC
HCT: 45.6 % (ref 36.0–46.0)
Hemoglobin: 14.8 g/dL (ref 12.0–15.0)
MCH: 29.4 pg (ref 26.0–34.0)
MCHC: 32.5 g/dL (ref 30.0–36.0)
MCV: 90.7 fL (ref 80.0–100.0)
Platelets: 257 10*3/uL (ref 150–400)
RBC: 5.03 MIL/uL (ref 3.87–5.11)
RDW: 17.1 % — ABNORMAL HIGH (ref 11.5–15.5)
WBC: 14.1 10*3/uL — ABNORMAL HIGH (ref 4.0–10.5)
nRBC: 0 % (ref 0.0–0.2)

## 2019-09-30 LAB — RAPID URINE DRUG SCREEN, HOSP PERFORMED
Amphetamines: NOT DETECTED
Barbiturates: NOT DETECTED
Benzodiazepines: NOT DETECTED
Cocaine: NOT DETECTED
Opiates: NOT DETECTED
Tetrahydrocannabinol: POSITIVE — AB

## 2019-09-30 LAB — I-STAT BETA HCG BLOOD, ED (NOT ORDERABLE): I-stat hCG, quantitative: <5 m[IU]/mL (ref <5)

## 2019-09-30 LAB — LIPASE, BLOOD: Lipase: 27 U/L (ref 11–51)

## 2019-09-30 MED ORDER — ONDANSETRON HCL 4 MG/2ML IJ SOLN
4.0000 mg | Freq: Once | INTRAMUSCULAR | Status: AC
  Administered 2019-09-30: 4 mg via INTRAVENOUS
  Filled 2019-09-30: qty 2

## 2019-09-30 MED ORDER — PANTOPRAZOLE SODIUM 40 MG IV SOLR
40.0000 mg | Freq: Once | INTRAVENOUS | Status: AC
  Administered 2019-09-30: 40 mg via INTRAVENOUS
  Filled 2019-09-30: qty 40

## 2019-09-30 MED ORDER — HYDROXYZINE HCL 25 MG PO TABS: 25.0000 mg | ORAL_TABLET | Freq: Four times a day (QID) | ORAL | 0 refills | Status: DC | PRN

## 2019-09-30 MED ORDER — SODIUM CHLORIDE 0.9 % IV SOLN
750.0000 mg | Freq: Once | INTRAVENOUS | Status: AC
  Administered 2019-09-30: 750 mg via INTRAVENOUS
  Filled 2019-09-30: qty 7.5

## 2019-09-30 MED ORDER — PROMETHAZINE HCL 25 MG PO TABS: 25.0000 mg | ORAL_TABLET | Freq: Four times a day (QID) | ORAL | 0 refills | Status: DC | PRN

## 2019-09-30 MED ORDER — PROMETHAZINE HCL 25 MG/ML IJ SOLN
12.5000 mg | Freq: Once | INTRAMUSCULAR | Status: AC
  Administered 2019-09-30: 12.5 mg via INTRAVENOUS
  Filled 2019-09-30: qty 1

## 2019-09-30 MED ORDER — LORAZEPAM 2 MG/ML IJ SOLN
0.5000 mg | Freq: Once | INTRAMUSCULAR | Status: AC
  Administered 2019-09-30: 0.5 mg via INTRAVENOUS
  Filled 2019-09-30: qty 1

## 2019-09-30 MED ORDER — IOHEXOL 350 MG/ML SOLN
100.0000 mL | Freq: Once | INTRAVENOUS | Status: AC | PRN
  Administered 2019-09-30: 100 mL via INTRAVENOUS

## 2019-09-30 MED ORDER — SODIUM CHLORIDE (PF) 0.9 % IJ SOLN
INTRAMUSCULAR | Status: AC
  Filled 2019-09-30: qty 50

## 2019-09-30 MED ORDER — SODIUM CHLORIDE 0.9% FLUSH
3.0000 mL | Freq: Once | INTRAVENOUS | Status: AC
  Administered 2019-09-30: 3 mL via INTRAVENOUS

## 2019-09-30 MED ORDER — SODIUM CHLORIDE 0.9 % IV BOLUS
1000.0000 mL | Freq: Once | INTRAVENOUS | Status: AC
  Administered 2019-09-30: 1000 mL via INTRAVENOUS

## 2019-09-30 NOTE — ED Provider Notes (Signed)
[facility]-EMERGENCY DEPT Provider Note   CSN: [id number] Arrival date & time: 09/30/19  1004     History Chief Complaint  Patient presents with  . Anxiety  . Abdominal Pain  . Chest Pain    Jacqueline Jones is a 29 y.o. female with past medical history of seizures, anxiety, asthma, depression, generalized anxiety disorder, who presents today for evaluation of complaints. She reports that at 4 AM she woke up nauseous with chest pain.  Her pain feels like a pressure and is in the middle of her chest, does not radiate or move.  She reports she has vomited over 10 times since this started.  She denies any constipation or diarrhea.  No abdominal pain.  She denies any fevers or chills.  No known sick contacts.  She denies dysuria, increased frequency or urgency. She states that she does not know the name of any of her medications, however no she takes nothing for high blood pressure.  She denies any medication changes recently.  She reports that the chest pain is new and not like anything she has felt before.  She denies any leg swelling, however does note that she takes estrogen-containing birth control.  She reports occasionally feeling like her heart is racing.  She denies any known trigger for her anxiety however states she feels anxious. She denies any known Covid contacts.   States that she does see a primary care doctor, last saw them 2 weeks ago. She states that she had been taken off her antianxiety medications as her primary care doctor stated that she was on too many medications.   HPI     Past Medical History:  Diagnosis Date  . Anxiety   . Anxiety   . Asthma   . Depression   . Depression   . Frequent headaches   . Infection    UTI  . Pneumonia   . Seizures (HCC)    unknown cause- started 08/14  "monthly"  . Sepsis (HCC)   . UTI (lower urinary tract infection)     Patient Active Problem List   Diagnosis Date Noted  . Medical non-compliance  05/13/2014  . Neurologic abnormality 05/13/2014  . Folliculitis 05/13/2014  . UTI (lower urinary tract infection) 02/18/2014  . Pupil asymmetry 02/05/2014  . Polysubstance dependence (HCC) 02/28/2012  . GAD (generalized anxiety disorder) 09/02/2011    History reviewed. No pertinent surgical history.   OB History    Gravida  0   Para  0   Term      Preterm      AB      Living        SAB      TAB      Ectopic      Multiple      Live Births              Family History  Problem Relation Age of Onset  . Drug abuse Father   . Drug abuse Sister   . Drug abuse Brother     Social History   Tobacco Use  . Smoking status: Former Smoker    Packs/day: 0.25    Years: 10.00    Pack years: 2.50    Types: Cigarettes  . Smokeless tobacco: Former Neurosurgeon  . Tobacco comment: she is wanting to quit trying  Substance Use Topics  . Alcohol use: No  . Drug use: Not Currently    Frequency: 7.0 times per week  Home Medications Prior to Admission medications   Medication Sig Start Date End Date Taking? Authorizing Provider  lamoTRIgine (LAMICTAL) 25 MG tablet Take 25 mg by mouth 2 (two) times daily.   Yes [provider]  levETIRAcetam (KEPPRA) 750 MG tablet Take 750 mg by mouth 2 (two) times daily.   Yes [provider]  albuterol (PROVENTIL HFA;VENTOLIN HFA) 108 (90 BASE) MCG/ACT inhaler Inhale 2 puffs into the lungs every 6 (six) hours as needed for wheezing or shortness of breath.     [provider]  carbamazepine (TEGRETOL XR) 400 MG 12 hr tablet Take 1 tablet (400 mg total) by mouth 2 (two) times daily. Patient not taking: Reported on 09/30/2019 05/13/14   Myrlene Broker, MD  hydrOXYzine (ATARAX/VISTARIL) 25 MG tablet Take 1 tablet (25 mg total) by mouth every 6 (six) hours as needed for anxiety, nausea or vomiting. 09/30/19   Cristina Gong, PA-C  naproxen (NAPROSYN) 500 MG tablet Take 1 tablet (500 mg total) by mouth 2 (two)  times daily with a meal. Patient not taking: Reported on 09/30/2019 05/13/14   Myrlene Broker, MD  PARoxetine (PAXIL) 20 MG tablet Take 1 tablet (20 mg total) by mouth daily. Patient not taking: Reported on 09/30/2019 05/13/14   Myrlene Broker, MD  promethazine (PHENERGAN) 25 MG tablet Take 1 tablet (25 mg total) by mouth every 6 (six) hours as needed for nausea or vomiting. 09/30/19   Cristina Gong, PA-C    Allergies    Toradol [ketorolac tromethamine], Flexeril [cyclobenzaprine], Gabapentin, Tramadol, Vicodin [hydrocodone-acetaminophen], Adhesive [tape], Keflet [cephalexin], Latex, and Levaquin [levofloxacin in d5w]  Review of Systems   Review of Systems  Constitutional: Negative for chills and fever.  HENT: Negative for congestion.   Respiratory: Positive for chest tightness. Negative for cough and shortness of breath.   Cardiovascular: Positive for chest pain and palpitations. Negative for leg swelling.  Gastrointestinal: Positive for nausea and vomiting. Negative for abdominal pain, constipation and diarrhea.  Genitourinary: Negative for dysuria.  Musculoskeletal: Negative for back pain and neck pain.  Skin: Negative for color change, rash and wound.  Neurological: Negative for weakness and headaches.  All other systems reviewed and are negative.   Physical Exam Updated Vital Signs BP (!) 157/91   Pulse 88   Temp 99 F (37.2 C) (Oral)   Resp 16   Ht 5' (1.524 m)   Wt 99.8 kg   LMP 09/26/2019   SpO2 97%   BMI 42.97 kg/m   Physical Exam Vitals and nursing note reviewed.  Constitutional:      Appearance: She is well-developed. She is obese. She is not diaphoretic.     Comments: Actively dry heaving, and vomiting  HENT:     Head: Normocephalic and atraumatic.  Eyes:     General: No scleral icterus.       Right eye: No discharge.        Left eye: No discharge.     Conjunctiva/sclera: Conjunctivae normal.  Cardiovascular:     Rate and Rhythm: Normal  rate and regular rhythm.     Heart sounds: Normal heart sounds. No murmur.  Pulmonary:     Effort: Pulmonary effort is normal. No respiratory distress.     Breath sounds: No stridor.  Chest:     Comments: Using back of hand anterior chest wall palpated with tenderness to palpation over the sternocostal joints bilaterally.  Palpation here both recreates and exacerbates her reported pain. Abdominal:  General: Bowel sounds are normal. There is no distension.     Palpations: Abdomen is soft.     Tenderness: There is no abdominal tenderness.     Hernia: No hernia is present.  Musculoskeletal:        General: No deformity.     Cervical back: Normal range of motion.  Skin:    General: Skin is warm and dry.  Neurological:     General: No focal deficit present.     Mental Status: She is alert.     Motor: No abnormal muscle tone.  Psychiatric:        Mood and Affect: Mood is anxious.     ED Results / Procedures / Treatments   Labs (all labs ordered are listed, but only abnormal results are displayed) Labs Reviewed  BASIC METABOLIC PANEL - Abnormal; Notable for the following components:      Result Value   CO2 20 (*)    Glucose, Bld 161 (*)    Anion gap 16 (*)    All other components within normal limits  CBC - Abnormal; Notable for the following components:   WBC 14.1 (*)    RDW 17.1 (*)    All other components within normal limits  URINALYSIS, ROUTINE W REFLEX MICROSCOPIC - Abnormal; Notable for the following components:   Ketones, ur 80 (*)    Protein, ur 30 (*)    Nitrite POSITIVE (*)    Bacteria, UA RARE (*)    Non Squamous Epithelial 0-5 (*)    All other components within normal limits  HEPATIC FUNCTION PANEL - Abnormal; Notable for the following components:   Total Protein 8.5 (*)    AST 79 (*)    ALT 90 (*)    All other components within normal limits  D-DIMER, QUANTITATIVE (NOT AT [facility]) - Abnormal; Notable for the following components:   D-Dimer, Quant 0.61 (*)      All other components within normal limits  RAPID URINE DRUG SCREEN, HOSP PERFORMED - Abnormal; Notable for the following components:   Tetrahydrocannabinol POSITIVE (*)    All other components within normal limits  URINE CULTURE  LIPASE, BLOOD  I-STAT BETA HCG BLOOD, ED ([facility], [facility], [facility] ONLY)  I-STAT BETA HCG BLOOD, ED (NOT ORDERABLE)  TROPONIN I (HIGH SENSITIVITY)  TROPONIN I (HIGH SENSITIVITY)    EKG EKG Interpretation  Date/Time:  Sunday September 30 2019 10:19:40 EST Ventricular Rate:  77 PR Interval:    QRS Duration: 95 QT Interval:  385 QTC Calculation: 436 R Axis:   38 Text Interpretation: Sinus rhythm Ventricular premature complex Low voltage, precordial leads Borderline T abnormalities, anterior leads No acute changes No significant change since last tracing Confirmed by Derwood Kaplan ([id number]) on 09/30/2019 2:23:09 PM   Radiology DG Chest 2 View  Result Date: 09/30/2019 CLINICAL DATA:  29 year old female with history of chest pain. EXAM: CHEST - 2 VIEW COMPARISON:  Chest x-ray 04/18/2014. FINDINGS: Lung volumes are normal. No consolidative airspace disease. No pleural effusions. No pneumothorax. No pulmonary nodule or mass noted. Pulmonary vasculature and the cardiomediastinal silhouette are within normal limits. IMPRESSION: 1.  No radiographic evidence of acute cardiopulmonary disease. Electronically Signed   By: Trudie Reed M.D.   On: 09/30/2019 10:56   CT Angio Chest PE W/Cm &/Or Wo Cm  Result Date: 09/30/2019 CLINICAL DATA:  Chest pain. Elevated D-dimer. Clinical suspicion for pulmonary embolism. EXAM: CT ANGIOGRAPHY CHEST WITH CONTRAST TECHNIQUE: Multidetector CT imaging of the chest was performed using  the standard protocol during bolus administration of intravenous contrast. Multiplanar CT image reconstructions and MIPs were obtained to evaluate the vascular anatomy. CONTRAST:  OMNIPAQUE IOHEXOL 350 MG/ML SOLN COMPARISON:  None. FINDINGS: Cardiovascular: Satisfactory  opacification of pulmonary arteries noted, and no pulmonary emboli identified. No evidence of thoracic aortic dissection or aneurysm. Mediastinum/Nodes: No masses or pathologically enlarged lymph nodes identified. Lungs/Pleura: No pulmonary mass, infiltrate, or effusion. Upper abdomen: Small hiatal hernia is noted. Musculoskeletal: No suspicious bone lesions identified. Review of the MIP images confirms the above findings. IMPRESSION: 1. No evidence of pulmonary embolism or other active disease within the thorax. 2. Small hiatal hernia. Electronically Signed   By: Danae Orleans M.D.   On: 09/30/2019 15:10    Procedures Procedures (including critical care time)  Medications Ordered in ED Medications  sodium chloride (PF) 0.9 % injection (has no administration in time range)  sodium chloride flush (NS) 0.9 % injection 3 mL (3 mLs Intravenous Given 09/30/19 1339)  sodium chloride 0.9 % bolus 1,000 mL (0 mLs Intravenous Stopped 09/30/19 1426)  ondansetron (ZOFRAN) injection 4 mg (4 mg Intravenous Given 09/30/19 1259)  LORazepam (ATIVAN) injection 0.5 mg (0.5 mg Intravenous Given 09/30/19 1259)  pantoprazole (PROTONIX) injection 40 mg (40 mg Intravenous Given 09/30/19 1501)  promethazine (PHENERGAN) injection 12.5 mg (12.5 mg Intravenous Given 09/30/19 1501)  levETIRAcetam (KEPPRA) 750 mg in sodium chloride 0.9 % 100 mL IVPB (0 mg Intravenous Stopped 09/30/19 1635)  iohexol (OMNIPAQUE) 350 MG/ML injection 100 mL (100 mLs Intravenous Contrast Given 09/30/19 1443)    ED Course  I have reviewed the triage vital signs and the nursing notes.  Pertinent labs & imaging results that were available during my care of the patient were reviewed by me and considered in my medical decision making (see chart for details).  Clinical Course as of Sep 29 2104  Wynelle Link Sep 30, 2019  1440 Patient reevaluated, we discussed her D-dimer slightly elevated and need for CTA PE study.  She states that she has previously had CT contrast  without difficulty.Repeat abdominal exam her abdomen remains soft, nontender nondistended.  She has developed hiccups.  She still feels nauseated however is no longer vomiting.  She has urinated.  She did not take her Keppra this morning.  We will give her IV Keppra as she feels she is still unable to tolerate p.o., IV Protonix, and IV Phenergan and reassess after CT scan.   [EH]    Clinical Course User Index [EH] Norman Clay   MDM Rules/Calculators/A&P                     Patient presents today for evaluation of chest pain, anxiety, nausea, and vomiting.  Her pain started at 4 AM this morning and has been consistent since. Troponin was obtained which was not elevated.  Given more than 3 hours since the onset of her symptoms no need for delta troponin.   BMP does show a slightly elevated anion gap at 16 due to CO2 being 20, however I suspect this is related to her vomiting and not clinically significant.  She does have mild transaminitis with an AST of 79 and ALT of 90 given that this is do not suspect that this is clinically significant.  Lipase is not significantly elevated.  Unable to apply PERC criteria as patient takes estrogen-containing birth control. D-dimer was obtained and was slightly elevated.  CTA PE study was performed without evidence of PE.  Repeat  abdominal exam shows patient has a soft, nontender nondistended nonsurgical abdomen, therefore CT abdomen pelvis was not obtained.  She does have a leukocytosis that 14.1, never I suspect that this is reactive secondary to her vomiting.  Her urine is nitrite positive, however microscopy shows 0-5 non-squamous epithelial cells, 0-5 squamous cells 0-5 whites, and rare bacteria.  Given that she is not reporting significant urinary symptoms I am not going to treat her, however I will send urine culture given her nausea vomiting and lack of a clear reason why.  As patient had been unable to tolerate her p.o. Keppra this morning  she was given that through IV in the emergency room.  She was treated with Ativan, and Zofran, and Phenergan in addition to IV fluids.  She was additionally given IV Protonix.  After this she was able to p.o. challenge without difficulty.  She is given a prescription for Phenergan at home.  She had requested to be restarted on her anxiety medications and given a prescription for Ativan, however I do not feel that this is appropriate to do from the emergency room, and recommended that she follow-up as an outpatient.  She is given a prescription for Atarax to help with her anxiety as needed.  Return precautions were discussed with patient who states their understanding.  At the time of discharge patient denied any unaddressed complaints or concerns.  Patient is agreeable for discharge home.  Note: Portions of this report may have been transcribed using voice recognition software. Every effort was made to ensure accuracy; however, inadvertent computerized transcription errors may be present  Final Clinical Impression(s) / ED Diagnoses Final diagnoses:  Atypical chest pain  Anxiety  Bilious vomiting with nausea    Rx / DC Orders ED Discharge Orders         Ordered    hydrOXYzine (ATARAX/VISTARIL) 25 MG tablet  Every 6 hours PRN     09/30/19 1721    promethazine (PHENERGAN) 25 MG tablet  Every 6 hours PRN     09/30/19 1721           Lorin Glass, Hershal Coria 09/30/19 2116    Varney Biles, MD 10/02/19 1826

## 2019-09-30 NOTE — ED Notes (Signed)
Pt ambulatory to RR independently for urine sample

## 2019-09-30 NOTE — ED Triage Notes (Addendum)
Pt requesting to restart anxiety medications. Some nausea reported. Pt anxious in triage. 157/97-94-93% RA CBG 128 At end of triage "my chest hurts"

## 2019-09-30 NOTE — Discharge Instructions (Signed)
Today you received medications that may make you sleepy or impair your ability to make decisions.  For the next 24 hours please do not drive, operate heavy machinery, care for a small child with out another adult present, or perform any activities that may cause harm to you or someone else if you were to fall asleep or be impaired.   You are being prescribed a medication which may make you sleepy. Please follow up of listed precautions for at least 24 hours after taking one dose.  

## 2019-10-03 LAB — URINE CULTURE: Culture: >=100000 — AB

## 2019-10-04 ENCOUNTER — Telehealth: Payer: Self-pay

## 2019-10-04 NOTE — Telephone Encounter (Signed)
Post ED Visit - Positive Culture Follow-up: Successful Patient Follow-Up  Culture assessed and recommendations reviewed by:  []  , Pharm.D. []  Enzo Bi, Pharm.D., BCPS AQ-ID []  , Pharm.D., BCPS []  Celedonio Miyamoto, Pharm.D., BCPS []  [place], Garvin Fila.D., BCPS, AAHIVP []  , Pharm.D., BCPS, AAHIVP []  Georgina Pillion, PharmD, BCPS []  , PharmD, BCPS []  [place], PharmD, BCPS []  [address], PharmD Pharm D Positive urine culture  [x]  Patient discharged without antimicrobial prescription and treatment is now indicated []  Organism is resistant to prescribed ED discharge antimicrobial []  Patient with positive blood cultures  Changes discussed with ED provider: Estella Husk MD New antibiotic prescription Macrobid 100 mg BId x 7 days 1 Called to [phone]  Contacted patient, date 10/04/19, time 1159   Jacqueline Jones, Phillips Climes 10/04/2019, 11:58 AM

## 2019-10-04 NOTE — Progress Notes (Signed)
ED Antimicrobial Stewardship Positive Culture Follow Up   Jacqueline Jones is an 29 y.o. female who presented to [facility] on 09/30/2019 with a chief complaint of  Chief Complaint  Patient presents with  . Anxiety  . Abdominal Pain  . Chest Pain    Recent Results (from the past 720 hour(s))  Urine culture     Status: Abnormal   Collection Time: 09/30/19  3:18 PM   Specimen: Urine, Random  Result Value Ref Range Status   Specimen Description   Final    URINE, RANDOM Performed at [facility], [address], [place], [place] [address]    Special Requests   Final    NONE Performed at [facility], [address], [place], [place] [address]    Culture >=100,000 COLONIES/mL ESCHERICHIA COLI (A)  Final   Report Status 10/03/2019 FINAL  Final   Organism ID, Bacteria ESCHERICHIA COLI (A)  Final      Susceptibility   Escherichia coli - MIC*    AMPICILLIN <=2 SENSITIVE Sensitive     CEFAZOLIN <=4 SENSITIVE Sensitive     CEFTRIAXONE <=0.25 SENSITIVE Sensitive     CIPROFLOXACIN <=0.25 SENSITIVE Sensitive     GENTAMICIN <=1 SENSITIVE Sensitive     IMIPENEM <=0.25 SENSITIVE Sensitive     NITROFURANTOIN <=16 SENSITIVE Sensitive     TRIMETH/SULFA <=20 SENSITIVE Sensitive     AMPICILLIN/SULBACTAM <=2 SENSITIVE Sensitive     PIP/TAZO <=4 SENSITIVE Sensitive     * >=100,000 COLONIES/mL ESCHERICHIA COLI    [x]  Patient discharged originally without antimicrobial agent and treatment is now indicated  New antibiotic prescription: Macrobid 100 mg PO BID x 7 days  ED Provider: , MD   [name] 10/04/2019, 11:35 AM 4th Year PharmD Candidate [phone]

## 2019-10-10 ENCOUNTER — Ambulatory Visit: Payer: Medicare Other | Admitting: Nurse Practitioner

## 2019-11-01 ENCOUNTER — Ambulatory Visit: Payer: Medicare Other | Admitting: Nurse Practitioner

## 2019-11-03 ENCOUNTER — Emergency Department (HOSPITAL_COMMUNITY): Payer: Medicaid Other

## 2019-11-03 ENCOUNTER — Other Ambulatory Visit: Payer: Self-pay

## 2019-11-03 ENCOUNTER — Inpatient Hospital Stay (HOSPITAL_COMMUNITY)
Admission: EM | Admit: 2019-11-03 | Discharge: 2019-11-05 | DRG: 918 | Disposition: A | Discharge status: Other Acute Inpatient Hospital | Payer: Medicaid Other | Attending: Pulmonary Disease | Admitting: Pulmonary Disease

## 2019-11-03 ENCOUNTER — Inpatient Hospital Stay (HOSPITAL_COMMUNITY): Payer: Medicaid Other

## 2019-11-03 DIAGNOSIS — G40909 Epilepsy, unspecified, not intractable, without status epilepticus: Secondary | ICD-10-CM | POA: Diagnosis present

## 2019-11-03 DIAGNOSIS — Z915 Personal history of self-harm: Secondary | ICD-10-CM

## 2019-11-03 DIAGNOSIS — Z885 Allergy status to narcotic agent status: Secondary | ICD-10-CM

## 2019-11-03 DIAGNOSIS — R7989 Other specified abnormal findings of blood chemistry: Secondary | ICD-10-CM

## 2019-11-03 DIAGNOSIS — Z20822 Contact with and (suspected) exposure to covid-19: Secondary | ICD-10-CM | POA: Diagnosis present

## 2019-11-03 DIAGNOSIS — E861 Hypovolemia: Secondary | ICD-10-CM | POA: Diagnosis present

## 2019-11-03 DIAGNOSIS — T39311A Poisoning by propionic acid derivatives, accidental (unintentional), initial encounter: Secondary | ICD-10-CM | POA: Diagnosis present

## 2019-11-03 DIAGNOSIS — Z87891 Personal history of nicotine dependence: Secondary | ICD-10-CM

## 2019-11-03 DIAGNOSIS — F332 Major depressive disorder, recurrent severe without psychotic features: Secondary | ICD-10-CM | POA: Diagnosis present

## 2019-11-03 DIAGNOSIS — Z888 Allergy status to other drugs, medicaments and biological substances status: Secondary | ICD-10-CM | POA: Diagnosis not present

## 2019-11-03 DIAGNOSIS — I10 Essential (primary) hypertension: Secondary | ICD-10-CM | POA: Diagnosis present

## 2019-11-03 DIAGNOSIS — Z91040 Latex allergy status: Secondary | ICD-10-CM | POA: Diagnosis not present

## 2019-11-03 DIAGNOSIS — T450X2A Poisoning by antiallergic and antiemetic drugs, intentional self-harm, initial encounter: Principal | ICD-10-CM | POA: Diagnosis present

## 2019-11-03 DIAGNOSIS — R569 Unspecified convulsions: Secondary | ICD-10-CM | POA: Diagnosis not present

## 2019-11-03 DIAGNOSIS — E876 Hypokalemia: Secondary | ICD-10-CM | POA: Diagnosis not present

## 2019-11-03 DIAGNOSIS — R739 Hyperglycemia, unspecified: Secondary | ICD-10-CM | POA: Diagnosis present

## 2019-11-03 DIAGNOSIS — F419 Anxiety disorder, unspecified: Secondary | ICD-10-CM | POA: Diagnosis present

## 2019-11-03 DIAGNOSIS — N390 Urinary tract infection, site not specified: Secondary | ICD-10-CM | POA: Diagnosis present

## 2019-11-03 DIAGNOSIS — Z881 Allergy status to other antibiotic agents status: Secondary | ICD-10-CM

## 2019-11-03 DIAGNOSIS — E872 Acidosis: Secondary | ICD-10-CM | POA: Diagnosis present

## 2019-11-03 DIAGNOSIS — N179 Acute kidney failure, unspecified: Secondary | ICD-10-CM | POA: Diagnosis present

## 2019-11-03 DIAGNOSIS — J45909 Unspecified asthma, uncomplicated: Secondary | ICD-10-CM | POA: Diagnosis present

## 2019-11-03 DIAGNOSIS — Z813 Family history of other psychoactive substance abuse and dependence: Secondary | ICD-10-CM | POA: Diagnosis not present

## 2019-11-03 DIAGNOSIS — T4271XD Poisoning by unspecified antiepileptic and sedative-hypnotic drugs, accidental (unintentional), subsequent encounter: Secondary | ICD-10-CM | POA: Diagnosis not present

## 2019-11-03 DIAGNOSIS — F322 Major depressive disorder, single episode, severe without psychotic features: Secondary | ICD-10-CM | POA: Diagnosis not present

## 2019-11-03 DIAGNOSIS — T50902A Poisoning by unspecified drugs, medicaments and biological substances, intentional self-harm, initial encounter: Secondary | ICD-10-CM

## 2019-11-03 LAB — COMPREHENSIVE METABOLIC PANEL
ALT: 45 U/L — ABNORMAL HIGH (ref 0–44)
AST: 54 U/L — ABNORMAL HIGH (ref 15–41)
Albumin: 4.4 g/dL (ref 3.5–5.0)
Alkaline Phosphatase: 112 U/L (ref 38–126)
Anion gap: 26 — ABNORMAL HIGH (ref 5–15)
BUN: 10 mg/dL (ref 6–20)
CO2: 15 mmol/L — ABNORMAL LOW (ref 22–32)
Calcium: 9.3 mg/dL (ref 8.9–10.3)
Chloride: 101 mmol/L (ref 98–111)
Creatinine, Ser: 1.11 mg/dL — ABNORMAL HIGH (ref 0.44–1.00)
GFR calc Af Amer: >60 mL/min (ref >60)
GFR calc non Af Amer: >60 mL/min (ref >60)
Glucose, Bld: 155 mg/dL — ABNORMAL HIGH (ref 70–99)
Potassium: 3.5 mmol/L (ref 3.5–5.1)
Sodium: 142 mmol/L (ref 135–145)
Total Bilirubin: 0.8 mg/dL (ref 0.3–1.2)
Total Protein: 8.4 g/dL — ABNORMAL HIGH (ref 6.5–8.1)

## 2019-11-03 LAB — CBC WITH DIFFERENTIAL/PLATELET
Abs Immature Granulocytes: 0.12 10*3/uL — ABNORMAL HIGH (ref 0.00–0.07)
Basophils Absolute: 0.1 10*3/uL (ref 0.0–0.1)
Basophils Relative: 0 %
Eosinophils Absolute: 0 10*3/uL (ref 0.0–0.5)
Eosinophils Relative: 0 %
HCT: 49 % — ABNORMAL HIGH (ref 36.0–46.0)
Hemoglobin: 15.4 g/dL — ABNORMAL HIGH (ref 12.0–15.0)
Immature Granulocytes: 1 %
Lymphocytes Relative: 18 %
Lymphs Abs: 4.3 10*3/uL — ABNORMAL HIGH (ref 0.7–4.0)
MCH: 29.3 pg (ref 26.0–34.0)
MCHC: 31.4 g/dL (ref 30.0–36.0)
MCV: 93.3 fL (ref 80.0–100.0)
Monocytes Absolute: 1.3 10*3/uL — ABNORMAL HIGH (ref 0.1–1.0)
Monocytes Relative: 6 %
Neutro Abs: 17.8 10*3/uL — ABNORMAL HIGH (ref 1.7–7.7)
Neutrophils Relative %: 75 %
Platelets: 464 10*3/uL — ABNORMAL HIGH (ref 150–400)
RBC: 5.25 MIL/uL — ABNORMAL HIGH (ref 3.87–5.11)
RDW: 15.2 % (ref 11.5–15.5)
WBC: 23.7 10*3/uL — ABNORMAL HIGH (ref 4.0–10.5)
nRBC: 0 % (ref 0.0–0.2)

## 2019-11-03 LAB — LIPASE, BLOOD: Lipase: 24 U/L (ref 11–51)

## 2019-11-03 LAB — RESPIRATORY PANEL BY RT PCR (FLU A&B, COVID)
Influenza A by PCR: NEGATIVE
Influenza B by PCR: NEGATIVE
SARS Coronavirus 2 by RT PCR: NEGATIVE

## 2019-11-03 LAB — LACTIC ACID, PLASMA: Lactic Acid, Venous: 1.7 mmol/L (ref 0.5–1.9)

## 2019-11-03 LAB — URINALYSIS, ROUTINE W REFLEX MICROSCOPIC
Bilirubin Urine: NEGATIVE
Glucose, UA: NEGATIVE mg/dL
Ketones, ur: NEGATIVE mg/dL
Nitrite: POSITIVE — AB
Protein, ur: 100 mg/dL — AB
Specific Gravity, Urine: 1.02 (ref 1.005–1.030)
pH: 5 (ref 5.0–8.0)

## 2019-11-03 LAB — RAPID URINE DRUG SCREEN, HOSP PERFORMED
Amphetamines: NOT DETECTED
Barbiturates: NOT DETECTED
Benzodiazepines: NOT DETECTED
Cocaine: NOT DETECTED
Opiates: NOT DETECTED
Tetrahydrocannabinol: POSITIVE — AB

## 2019-11-03 LAB — SALICYLATE LEVEL: Salicylate Lvl: <7 mg/dL — ABNORMAL LOW (ref 7.0–30.0)

## 2019-11-03 LAB — ETHANOL: Alcohol, Ethyl (B): <10 mg/dL (ref <10)

## 2019-11-03 LAB — MAGNESIUM: Magnesium: 2.1 mg/dL (ref 1.7–2.4)

## 2019-11-03 LAB — ACETAMINOPHEN LEVEL: Acetaminophen (Tylenol), Serum: <10 ug/mL — ABNORMAL LOW (ref 10–30)

## 2019-11-03 MED ORDER — ORAL CARE MOUTH RINSE
15.0000 mL | Freq: Two times a day (BID) | OROMUCOSAL | Status: DC
  Administered 2019-11-03 – 2019-11-04 (×3): 15 mL via OROMUCOSAL

## 2019-11-03 MED ORDER — LEVETIRACETAM IN NACL 1000 MG/100ML IV SOLN
1000.0000 mg | Freq: Once | INTRAVENOUS | Status: AC
  Administered 2019-11-03: 1000 mg via INTRAVENOUS
  Filled 2019-11-03: qty 100

## 2019-11-03 MED ORDER — LORAZEPAM 2 MG/ML IJ SOLN
2.0000 mg | INTRAMUSCULAR | Status: DC | PRN
  Administered 2019-11-03 – 2019-11-05 (×5): 2 mg via INTRAVENOUS
  Filled 2019-11-03 (×5): qty 1

## 2019-11-03 MED ORDER — HYDROXYZINE HCL 25 MG PO TABS
25.0000 mg | ORAL_TABLET | Freq: Four times a day (QID) | ORAL | Status: DC | PRN
  Administered 2019-11-03: 25 mg via ORAL
  Filled 2019-11-03: qty 1

## 2019-11-03 MED ORDER — SULFAMETHOXAZOLE-TRIMETHOPRIM 800-160 MG PO TABS
1.0000 | ORAL_TABLET | Freq: Two times a day (BID) | ORAL | Status: DC
  Administered 2019-11-04 – 2019-11-05 (×2): 1 via ORAL
  Filled 2019-11-03 (×2): qty 1

## 2019-11-03 MED ORDER — ALBUTEROL SULFATE HFA 108 (90 BASE) MCG/ACT IN AERS: 2.0000 | INHALATION_SPRAY | Freq: Four times a day (QID) | RESPIRATORY_TRACT | Status: DC | PRN

## 2019-11-03 MED ORDER — LEVETIRACETAM 500 MG PO TABS
750.0000 mg | ORAL_TABLET | Freq: Two times a day (BID) | ORAL | Status: DC
  Administered 2019-11-03 – 2019-11-05 (×5): 750 mg via ORAL
  Filled 2019-11-03 (×8): qty 1

## 2019-11-03 MED ORDER — SODIUM CHLORIDE 0.9 % IV SOLN
1.0000 g | Freq: Once | INTRAVENOUS | Status: DC
  Filled 2019-11-03: qty 1

## 2019-11-03 MED ORDER — ALBUTEROL SULFATE (2.5 MG/3ML) 0.083% IN NEBU: 2.5000 mg | INHALATION_SOLUTION | Freq: Four times a day (QID) | RESPIRATORY_TRACT | Status: DC | PRN

## 2019-11-03 MED ORDER — ENOXAPARIN SODIUM 40 MG/0.4ML ~~LOC~~ SOLN
40.0000 mg | SUBCUTANEOUS | Status: DC
  Administered 2019-11-03 – 2019-11-04 (×2): 40 mg via SUBCUTANEOUS
  Filled 2019-11-03 (×2): qty 0.4

## 2019-11-03 MED ORDER — LORAZEPAM 2 MG/ML IJ SOLN: 0.5000 mg | Freq: Once | INTRAMUSCULAR | Status: DC

## 2019-11-03 MED ORDER — SODIUM CHLORIDE 0.9 % IV SOLN: INTRAVENOUS | Status: DC

## 2019-11-03 MED ORDER — LAMOTRIGINE 25 MG PO TABS
25.0000 mg | ORAL_TABLET | Freq: Two times a day (BID) | ORAL | Status: DC
  Administered 2019-11-03 – 2019-11-05 (×5): 25 mg via ORAL
  Filled 2019-11-03 (×5): qty 1

## 2019-11-03 MED ORDER — SODIUM CHLORIDE 0.9 % IV BOLUS (SEPSIS)
1000.0000 mL | Freq: Once | INTRAVENOUS | Status: AC
  Administered 2019-11-03: 1000 mL via INTRAVENOUS

## 2019-11-03 MED ORDER — PROMETHAZINE HCL 25 MG PO TABS
25.0000 mg | ORAL_TABLET | Freq: Four times a day (QID) | ORAL | Status: DC | PRN
  Administered 2019-11-03: 25 mg via ORAL
  Filled 2019-11-03: qty 1

## 2019-11-03 NOTE — ED Provider Notes (Signed)
[facility]-EMERGENCY DEPT Provider Note   CSN: [id number] Arrival date & time: 11/03/19  1023     History Chief Complaint  Patient presents with  . Seizures    Jacqueline Jones is a 29 y.o. female.  Patient had a seizure last night and then another seizure this morning and when she arrived in the emergency department she had another seizure and vomited.  After the patient was in the emergency department for 3 hours her husband went home and found out that the medicine brought to sleep with yesterday is now missing.  Supposedly 20 pills of Aleve p.m. with missing from yesterday and three fourths of a bottle of Benadryl liquid is missing from yesterday.  The patient has a history of depression and suicide attempt.  There is no note saying that she did that though  The history is provided by a relative, the patient and medical records. No language interpreter was used.  Seizures Seizure activity on arrival: yes   Seizure type:  Grand mal Preceding symptoms: no headache   Initial focality:  None Episode characteristics: abnormal movements   Return to baseline: no   Severity:  Moderate Timing:  Clustered Progression:  Unchanged Context: not alcohol withdrawal   Recent head injury:  No recent head injuries PTA treatment:  None      Past Medical History:  Diagnosis Date  . Anxiety   . Anxiety   . Asthma   . Depression   . Depression   . Frequent headaches   . Infection    UTI  . Pneumonia   . Seizures (HCC)    unknown cause- started 08/14  "monthly"  . Sepsis (HCC)   . UTI (lower urinary tract infection)     Patient Active Problem List   Diagnosis Date Noted  . Medical non-compliance 05/13/2014  . Neurologic abnormality 05/13/2014  . Folliculitis 05/13/2014  . UTI (lower urinary tract infection) 02/18/2014  . Pupil asymmetry 02/05/2014  . Polysubstance dependence (HCC) 02/28/2012  . GAD (generalized anxiety disorder) 09/02/2011    No past  surgical history on file.   OB History    Gravida  0   Para  0   Term      Preterm      AB      Living        SAB      TAB      Ectopic      Multiple      Live Births              Family History  Problem Relation Age of Onset  . Drug abuse Father   . Drug abuse Sister   . Drug abuse Brother     Social History   Tobacco Use  . Smoking status: Former Smoker    Packs/day: 0.25    Years: 10.00    Pack years: 2.50    Types: Cigarettes  . Smokeless tobacco: Former Neurosurgeon  . Tobacco comment: she is wanting to quit trying  Substance Use Topics  . Alcohol use: No  . Drug use: Not Currently    Frequency: 7.0 times per week    Home Medications Prior to Admission medications   Medication Sig Start Date End Date Taking? Authorizing Provider  albuterol (PROVENTIL HFA;VENTOLIN HFA) 108 (90 BASE) MCG/ACT inhaler Inhale 2 puffs into the lungs every 6 (six) hours as needed for wheezing or shortness of breath.    Yes [provider]  hydrOXYzine (ATARAX/VISTARIL) 25 MG tablet Take 1 tablet (25 mg total) by mouth every 6 (six) hours as needed for anxiety, nausea or vomiting. 09/30/19  Yes Cristina Gong, PA-C  lamoTRIgine (LAMICTAL) 25 MG tablet Take 25 mg by mouth 2 (two) times daily.   Yes [provider]  levETIRAcetam (KEPPRA) 750 MG tablet Take 750 mg by mouth 2 (two) times daily.   Yes [provider]  promethazine (PHENERGAN) 25 MG tablet Take 1 tablet (25 mg total) by mouth every 6 (six) hours as needed for nausea or vomiting. 09/30/19  Yes Cristina Gong, PA-C  carbamazepine (TEGRETOL XR) 400 MG 12 hr tablet Take 1 tablet (400 mg total) by mouth 2 (two) times daily. Patient not taking: Reported on 09/30/2019 05/13/14   Myrlene Broker, MD  PARoxetine (PAXIL) 20 MG tablet Take 1 tablet (20 mg total) by mouth daily. Patient not taking: Reported on 09/30/2019 05/13/14   Myrlene Broker, MD    Allergies    Toradol  [ketorolac tromethamine], Flexeril [cyclobenzaprine], Gabapentin, Tramadol, Vicodin [hydrocodone-acetaminophen], Adhesive [tape], Keflet [cephalexin], Latex, and Levaquin [levofloxacin in d5w]  Review of Systems   Review of Systems  Unable to perform ROS: Mental status change  Neurological: Positive for seizures.    Physical Exam Updated Vital Signs BP (!) 138/119 (BP Location: Left Arm)   Pulse 72   Temp 98.3 F (36.8 C) (Axillary)   Resp (!) 39   SpO2 95%   Physical Exam Vitals and nursing note reviewed.  Constitutional:      Appearance: She is well-developed.     Comments: Altered  HENT:     Head: Normocephalic.     Nose: Nose normal.  Eyes:     General: No scleral icterus.    Conjunctiva/sclera: Conjunctivae normal.  Neck:     Thyroid: No thyromegaly.  Cardiovascular:     Rate and Rhythm: Normal rate.     Heart sounds: No murmur. No friction rub. No gallop.      Comments: Tachycardic Pulmonary:     Breath sounds: No stridor. Rales present. No wheezing.  Chest:     Chest wall: No tenderness.  Abdominal:     General: There is no distension.     Tenderness: There is no abdominal tenderness. There is no rebound.  Musculoskeletal:        General: Normal range of motion.     Cervical back: Neck supple.  Lymphadenopathy:     Cervical: No cervical adenopathy.  Skin:    Findings: No erythema or rash.  Neurological:     Motor: No abnormal muscle tone.     Coordination: Coordination normal.     Comments: After the seizure patient was awake but confused     ED Results / Procedures / Treatments   Labs (all labs ordered are listed, but only abnormal results are displayed) Labs Reviewed  CBC WITH DIFFERENTIAL/PLATELET - Abnormal; Notable for the following components:      Result Value   WBC 23.7 (*)    RBC 5.25 (*)    Hemoglobin 15.4 (*)    HCT 49.0 (*)    Platelets 464 (*)    Neutro Abs 17.8 (*)    Lymphs Abs 4.3 (*)    Monocytes Absolute 1.3 (*)    Abs  Immature Granulocytes 0.12 (*)    All other components within normal limits  COMPREHENSIVE METABOLIC PANEL - Abnormal; Notable for the following components:   CO2 15 (*)  Glucose, Bld 155 (*)    Creatinine, Ser 1.11 (*)    Total Protein 8.4 (*)    AST 54 (*)    ALT 45 (*)    Anion gap 26 (*)    All other components within normal limits  URINALYSIS, ROUTINE W REFLEX MICROSCOPIC - Abnormal; Notable for the following components:   APPearance CLOUDY (*)    Hgb urine dipstick LARGE (*)    Protein, ur 100 (*)    Nitrite POSITIVE (*)    Leukocytes,Ua TRACE (*)    Bacteria, UA MANY (*)    All other components within normal limits  RAPID URINE DRUG SCREEN, HOSP PERFORMED - Abnormal; Notable for the following components:   Tetrahydrocannabinol POSITIVE (*)    All other components within normal limits  RESPIRATORY PANEL BY RT PCR (FLU A&B, COVID)  URINE CULTURE  LIPASE, BLOOD  ACETAMINOPHEN LEVEL  ETHANOL  SALICYLATE LEVEL  LACTIC ACID, PLASMA  MAGNESIUM    EKG EKG Interpretation  Date/Time:  Saturday November 03 2019 11:32:18 EDT Ventricular Rate:  135 PR Interval:    QRS Duration: 79 QT Interval:  275 QTC Calculation: 413 R Axis:   26 Text Interpretation: Sinus tachycardia Low voltage, precordial leads Borderline T abnormalities, anterior leads Confirmed by Bethann Berkshire [phone]) on 11/03/2019 2:04:30 PM   Radiology DG Chest Port 1 View  Result Date: 11/03/2019 CLINICAL DATA:  Seizure. EXAM: PORTABLE CHEST 1 VIEW COMPARISON:  September 30, 2019 FINDINGS: The heart size and mediastinal contours are within normal limits. Both lungs are clear. The visualized skeletal structures are unremarkable. IMPRESSION: No active disease. Electronically Signed   By: Gerome Sam III M.D   On: 11/03/2019 11:47    Procedures Procedures (including critical care time)  Medications Ordered in ED Medications  aztreonam (AZACTAM) 1 g in sodium chloride 0.9 % 100 mL IVPB (has no administration in  time range)  levETIRAcetam (KEPPRA) IVPB 1000 mg/100 mL premix (0 mg Intravenous Stopped 11/03/19 1214)  sodium chloride 0.9 % bolus 1,000 mL (1,000 mLs Intravenous New Bag/Given 11/03/19 1146)    ED Course  I have reviewed the triage vital signs and the nursing notes.  Pertinent labs & imaging results that were available during my care of the patient were reviewed by me and considered in my medical decision making (see chart for details). CRITICAL CARE Performed by: Bethann Berkshire Total critical care time: 45 minutes Critical care time was exclusive of separately billable procedures and treating other patients. Critical care was necessary to treat or prevent imminent or life-threatening deterioration. Critical care was time spent personally by me on the following activities: development of treatment plan with patient and/or surrogate as well as nursing, discussions with consultants, evaluation of patient's response to treatment, examination of patient, obtaining history from patient or surrogate, ordering and performing treatments and interventions, ordering and review of laboratory studies, ordering and review of radiographic studies, pulse oximetry and re-evaluation of patient's condition.    MDM Rules/Calculators/A&P                     Patient with persistent seizures.  And possible Benadryl overdose with altered mental status she will be admitted to critical care Final Clinical Impression(s) / ED Diagnoses Final diagnoses:  Seizure [facility])    This patient presents to the ED for concern of seizure, this involves an extensive number of treatment options, and is a complaint that carries with it a high risk of complications and morbidity.  The  differential diagnosis includes intractable seizures head injury tumors overdose   Lab Tests:   I Ordered, reviewed, and interpreted labs, which included CBC chemistries urinalysis.  White blood count is elevated at 24,000.  Also patient is acidotic  with a low CO2 and high anion gap  Medicines ordered:   I ordered medication Keppra for seizures  Imaging Studies ordered:   I ordered imaging studies which included chest x-ray and  I independently visualized and interpreted imaging which showed no acute disease  Additional history obtained:   Additional history obtained from husband  Previous records obtained and reviewed   Consultations Obtained:   I consulted critical care and discussed lab and imaging findings, they will admit the patient for persistent seizures and probable Benadryl overdose  Reevaluation:  After the interventions stated above, I reevaluated the patient and found patient no longer having any seizures but still confused  Critical Interventions:  .   Rx / DC Orders ED Discharge Orders    None       Milton Ferguson, MD 11/03/19 1455

## 2019-11-03 NOTE — ED Notes (Addendum)
Pt lying in bed, eye's closed, chest rising and falling. Pt awakens easily. Will continue to monitor. Sitter at bedside.

## 2019-11-03 NOTE — ED Notes (Signed)
X-ray at bedside

## 2019-11-03 NOTE — ED Notes (Addendum)
Pt sitting up in bed. States being nauseous. meds given. Will continue to monitor. Sitter at bedside.

## 2019-11-03 NOTE — ED Notes (Signed)
Poison controlled called for recommendations on pt case. Spoke with Angelique Blonder at poison control who recommened the following actions be taken: Draw CMP, salicylate level, magnesium level, and acetaminophen level, capture an EKG now and another EKG in four hours, cardiac monitoring. It was also recommended that magnesium and potassium level remain on the high side of normal so it is recommended to supplement if necessary. If pt seizes it was recommended that pt be given benzodiazepine. Repeat CMP in four hours at 6:15 pm. Provider Zammit made aware of the recommendations

## 2019-11-03 NOTE — ED Notes (Signed)
Husband at bedside.  

## 2019-11-03 NOTE — ED Triage Notes (Signed)
Pt BIBA from home.    Per EMS- Family reports witnessed seizure lasting appx 5 min at home.  Pt also with witnessed seizure by EMS lasting appx 2 min.  Pt with hx of seizures, prescribed keppra.

## 2019-11-03 NOTE — H&P (Signed)
NAME:  Jacqueline Jones, MRN:  [id number], DOB:  09-Mar-1991, LOS: 0 ADMISSION DATE:  11/03/2019, CONSULTATION DATE:  11/03/2019 REFERRING MD:  Dr. Estell Harpin, ER, CHIEF COMPLAINT:  Seizure   Brief History   29 yo female former smoker with hx of seizure disorder had witnessed seizure at home and by EMS.  Pt noted to be more depressed and husband reports she several aleve PM tablets and OTC liquid sleep aide.  Pt has hx of depression and had hospitalization for intentional baclofen overdose in September 2020.  Labs showed metabolic acidosis, leukocytosis, and possible UTI.  History of present illness   29 yo female was living in [place] and moved back to [place] in December 2020.  She has history of depression, seizures, and polysubstance abuse.  Her husband reports that she has been more depressed recently.  She had witnessed seizure lasting 5 minutes at home and then another witnessed by EMS lasting about 2 minutes.  Husband reports he had purchased Aleve PM approximately one day ago for his wife. He states that the bottle had approx 40 pills in it but when he checked after leaving the emergency room he saw only 22 pills left. Pt's husband also states that he bought her an over the counter "sleeptime Sleep aide" in liquid form. He states there was a full bottle yesterday but now only 1/4 of the bottle is left.  She was also hospitalized in September 2020 for intentional baclofen overdose.  Patient says she has been feeling depressed, but doesn't recall how she ended up in the ER or recall taking aleve PM or sleeping medicine.  Her husband also reports that doctors in [place] were worried about her liver and her cholesterol.  UDS positive for THC.  Lactic acid 1.7, acetaminophen and salicylate levels negative.  Creatinine 1.11 (baseline 0.75 from 10/01/19).  Past Medical History  Seizures, Recurrent UTI, Elevated LFTs, Depression with suicidal ideation, Anxiety, Asthma, Headaches, Pneumonia,  Polysubstance abuse.  Significant Hospital Events   4/10 Admit  Consults:  Psychiatry  Procedures:    Significant Diagnostic Tests:  CT head 4/10 >> non diagnostic due to patient motion during study  Micro Data:  SARS CoV2 PCR 4/10 >> negative Influenza PCR 4/10 >> negative Urine 4/10 >>  Antimicrobials:  Azactam 4/10 Bactrim 4/11 >>    Interim history/subjective:    Objective   Blood pressure (!) 138/119, pulse (!) 117, temperature 98.3 F (36.8 C), temperature source Axillary, resp. rate (!) 25, SpO2 95 %.        Intake/Output Summary (Last 24 hours) at 11/03/2019 1507 Last data filed at 11/03/2019 1214 Gross per 24 hour  Intake 97.9 ml  Output --  Net 97.9 ml   There were no vitals filed for this visit.  Examination:  General - alert, rocking back and forth in bed sitting up Eyes - pupils reactive ENT - no sinus tenderness, no stridor Cardiac - regular, tachycardic, no murmur Chest - equal breath sounds b/l, no wheezing or rales Abdomen - soft, non tender, + bowel sounds Extremities - no cyanosis, clubbing, or edema Skin - no rashes Neuro - normal strength, moves extremities, follows commands Psych - anxious, not consistently making eye contact, seems reserved and not forthcoming with information Lymphatics - no lymphadenopathy   Resolved Hospital Problem list     Assessment & Plan:   Seizure likely triggered by overdose and volume depletion. - continue keppra, lamictal - neuro checks - since she has hx of seizures, will  defer neurology assessment for now but consider if she has recurrence of seizure - prn ativan for recurrent seizure  Intentional overdose with history of depression and prior suicide attempt. - suicide precautions - psychiatry consulted - prn ativan for anxiety  AKI from hypovolemia. Anion gap metabolic acidosis likely from lactic acidosis after seizure. - NS IV fluid at 75 ml/hr - f/u renal fx, monitor urine  outpt  UTI. - received azactam x one dose on 4/10 - will give 3 days therapy of bactrim starting from 4/11  Elevated LFTs. - family reports concern for cirrhosis by her doctors in [place] records from [place] not available for review - will check abd u/s and f/u LFTs  Hyperglycemia. - no hx of DM - might be acute stress after seizure - check HbA1C  Hx of asthma. - prn albuterol  Best practice:  Diet: regular DVT prophylaxis: lovenox GI prophylaxis: Not indicated Mobility: bed rest Code Status: full code Family Communication: spoke with pt's husband over the phone Disposition: stepdown  Labs:  CBC: Recent Labs  Lab 11/03/19 1032  WBC 23.7*  NEUTROABS 17.8*  HGB 15.4*  HCT 49.0*  MCV 93.3  PLT 464*    Basic Metabolic Panel: Recent Labs  Lab 11/03/19 1032 11/03/19 1408  NA 142  --   K 3.5  --   CL 101  --   CO2 15*  --   GLUCOSE 155*  --   BUN 10  --   CREATININE 1.11*  --   CALCIUM 9.3  --   MG  --  2.1   GFR: CrCl cannot be calculated (Unknown ideal weight.). Recent Labs  Lab 11/03/19 1032 11/03/19 1404  WBC 23.7*  --   LATICACIDVEN  --  1.7    Liver Function Tests: Recent Labs  Lab 11/03/19 1032  AST 54*  ALT 45*  ALKPHOS 112  BILITOT 0.8  PROT 8.4*  ALBUMIN 4.4   Recent Labs  Lab 11/03/19 1127  LIPASE 24   No results for input(s): AMMONIA in the last 168 hours.  ABG    Component Value Date/Time   TCO2 27 12/05/2013 1410     Coagulation Profile: No results for input(s): INR, PROTIME in the last 168 hours.  Cardiac Enzymes: No results for input(s): CKTOTAL, CKMB, CKMBINDEX, TROPONINI in the last 168 hours.  HbA1C: Hgb A1c MFr Bld  Date/Time Value Ref Range Status  11/03/2011 09:57 AM 5.1 <5.7 % Final    Comment:                                                                           According to the ADA Clinical Practice Recommendations for 2011, when HbA1c is used as a screening test:     >=6.5%    Diagnostic of Diabetes Mellitus            (if abnormal result is confirmed)   5.7-6.4%   Increased risk of developing Diabetes Mellitus   References:Diagnosis and Classification of Diabetes Mellitus,Diabetes FUXN,2355,73(UKGUR 1):S62-S69 and Standards of Medical Care in         Diabetes - 2011,Diabetes KYHC,6237,62 (Suppl 1):S11-S61.    CBG: No results for input(s): GLUCAP in the last 168  hours.  Review of Systems:   Limited due to patient's reluctance to answer questions.  Past Medical History  She,  has a past medical history of Anxiety, Anxiety, Asthma, Depression, Depression, Frequent headaches, Infection, Pneumonia, Seizures (HCC), Sepsis (HCC), and UTI (lower urinary tract infection).   Surgical History   No past surgical history on file.   Social History   reports that she has quit smoking. Her smoking use included cigarettes. She has a 2.50 pack-year smoking history. She has quit using smokeless tobacco. She reports previous drug use. Frequency: 7.00 times per week. She reports that she does not drink alcohol.   Family History   Her family history includes Drug abuse in her brother, father, and sister.   Allergies Allergies  Allergen Reactions  . Toradol [Ketorolac Tromethamine] Itching    seizure  . Flexeril [Cyclobenzaprine] Other (See Comments)    seizure  . Gabapentin Other (See Comments)    seizures   . Tramadol Itching    Seizures  . Vicodin [Hydrocodone-Acetaminophen] Itching    Pt states she can take Tylenol without difficulty.  . Adhesive [Tape] Rash  . Keflet [Cephalexin] Rash    Did it involve swelling of the face/tongue/throat, SOB, or low BP? No Did it involve sudden or severe rash/hives, skin peeling, or any reaction on the inside of your mouth or nose? No Did you need to seek medical attention at a hospital or doctor's office? No When did it last happen? If all above answers are "NO", may proceed with cephalosporin use.   . Latex Rash   . Levaquin [Levofloxacin In D5w] Rash     Home Medications  Prior to Admission medications   Medication Sig Start Date End Date Taking? Authorizing Provider  albuterol (PROVENTIL HFA;VENTOLIN HFA) 108 (90 BASE) MCG/ACT inhaler Inhale 2 puffs into the lungs every 6 (six) hours as needed for wheezing or shortness of breath.    Yes [provider]  hydrOXYzine (ATARAX/VISTARIL) 25 MG tablet Take 1 tablet (25 mg total) by mouth every 6 (six) hours as needed for anxiety, nausea or vomiting. 09/30/19  Yes Cristina Gong, PA-C  lamoTRIgine (LAMICTAL) 25 MG tablet Take 25 mg by mouth 2 (two) times daily.   Yes [provider]  levETIRAcetam (KEPPRA) 750 MG tablet Take 750 mg by mouth 2 (two) times daily.   Yes [provider]  promethazine (PHENERGAN) 25 MG tablet Take 1 tablet (25 mg total) by mouth every 6 (six) hours as needed for nausea or vomiting. 09/30/19  Yes Cristina Gong, PA-C     Signature:   Coralyn Helling, MD  Pulmonary/Critical Care Pager - [phone] 11/03/2019, 3:27 PM

## 2019-11-03 NOTE — ED Notes (Signed)
  Pt transported to ct 

## 2019-11-03 NOTE — ED Notes (Signed)
Pts husband Ethelene Browns called this nurse and states that he had purchased Aleve PM approximately one day ago for his wife. He states that the bottle had approx 40 pills in it but when he checked after leaving the emergency room he saw only 22 pills left. Pt's husband also states that he bought her an over the counter "sleeptime Sleep aide" in liquid form. He states there was a full bottle yesterday but now only 1/4 of the bottle is left. Pt husband also states that his wife has a history of major depression and overdosed on Baclofen back in September 2020. Provider made aware

## 2019-11-03 NOTE — ED Notes (Signed)
Pt sitting up in bed rocking. Pt states she is very anxious. Pt pulled out old IV and monitor cables.

## 2019-11-04 ENCOUNTER — Encounter (HOSPITAL_COMMUNITY): Payer: Self-pay | Admitting: Pulmonary Disease

## 2019-11-04 ENCOUNTER — Other Ambulatory Visit: Payer: Self-pay

## 2019-11-04 DIAGNOSIS — F332 Major depressive disorder, recurrent severe without psychotic features: Secondary | ICD-10-CM

## 2019-11-04 LAB — COMPREHENSIVE METABOLIC PANEL
ALT: 34 U/L (ref 0–44)
AST: 32 U/L (ref 15–41)
Albumin: 3.3 g/dL — ABNORMAL LOW (ref 3.5–5.0)
Alkaline Phosphatase: 83 U/L (ref 38–126)
Anion gap: 10 (ref 5–15)
BUN: 7 mg/dL (ref 6–20)
CO2: 27 mmol/L (ref 22–32)
Calcium: 8.6 mg/dL — ABNORMAL LOW (ref 8.9–10.3)
Chloride: 103 mmol/L (ref 98–111)
Creatinine, Ser: 0.57 mg/dL (ref 0.44–1.00)
GFR calc Af Amer: >60 mL/min (ref >60)
GFR calc non Af Amer: >60 mL/min (ref >60)
Glucose, Bld: 85 mg/dL (ref 70–99)
Potassium: 2.9 mmol/L — ABNORMAL LOW (ref 3.5–5.1)
Sodium: 140 mmol/L (ref 135–145)
Total Bilirubin: 0.7 mg/dL (ref 0.3–1.2)
Total Protein: 6.3 g/dL — ABNORMAL LOW (ref 6.5–8.1)

## 2019-11-04 LAB — CBC
HCT: 38.8 % (ref 36.0–46.0)
Hemoglobin: 12.7 g/dL (ref 12.0–15.0)
MCH: 29.9 pg (ref 26.0–34.0)
MCHC: 32.7 g/dL (ref 30.0–36.0)
MCV: 91.3 fL (ref 80.0–100.0)
Platelets: 255 10*3/uL (ref 150–400)
RBC: 4.25 MIL/uL (ref 3.87–5.11)
RDW: 15.7 % — ABNORMAL HIGH (ref 11.5–15.5)
WBC: 11.7 10*3/uL — ABNORMAL HIGH (ref 4.0–10.5)
nRBC: 0 % (ref 0.0–0.2)

## 2019-11-04 LAB — MAGNESIUM: Magnesium: 2.1 mg/dL (ref 1.7–2.4)

## 2019-11-04 LAB — PHOSPHORUS: Phosphorus: 4.4 mg/dL (ref 2.5–4.6)

## 2019-11-04 MED ORDER — POTASSIUM CHLORIDE CRYS ER 20 MEQ PO TBCR
40.0000 meq | EXTENDED_RELEASE_TABLET | Freq: Once | ORAL | Status: AC
  Administered 2019-11-04: 40 meq via ORAL
  Filled 2019-11-04: qty 2

## 2019-11-04 NOTE — Progress Notes (Signed)
NAME:  Jacqueline Jones, MRN:  [id number], DOB:  [birth year], LOS: 1 ADMISSION DATE:  11/03/2019, CONSULTATION DATE:  11/03/2019 REFERRING MD:  Dr. Estell Harpin, ER, CHIEF COMPLAINT:  Seizure   Brief History   29 yo female former smoker with hx of seizure disorder had witnessed seizure at home and by EMS.  Pt noted to be more depressed and husband reports she several aleve PM tablets and OTC liquid sleep aide.  Pt has hx of depression and had hospitalization for intentional baclofen overdose in September 2020.  Labs showed metabolic acidosis, leukocytosis, and possible UTI.  History of present illness   29 yo female was living in [place] and moved back to [place] in December 2020.  She has history of depression, seizures, and polysubstance abuse.  Her husband reports that she has been more depressed recently.  She had witnessed seizure lasting 5 minutes at home and then another witnessed by EMS lasting about 2 minutes.  Husband reports he had purchased Aleve PM approximately one day ago for his wife. He states that the bottle had approx 40 pills in it but when he checked after leaving the emergency room he saw only 22 pills left. Pt's husband also states that he bought her an over the counter "sleeptime Sleep aide" in liquid form. He states there was a full bottle yesterday but now only 1/4 of the bottle is left.  She was also hospitalized in September 2020 for intentional baclofen overdose.  Patient says she has been feeling depressed, but doesn't recall how she ended up in the ER or recall taking aleve PM or sleeping medicine.  Her husband also reports that doctors in [place] were worried about her liver and her cholesterol.  UDS positive for THC.  Lactic acid 1.7, acetaminophen and salicylate levels negative.  Creatinine 1.11 (baseline 0.75 from 10/01/19).  Past Medical History  Seizures, Recurrent UTI, Elevated LFTs, Depression with suicidal ideation, Anxiety, Asthma, Headaches, Pneumonia,  Polysubstance abuse.  Significant Hospital Events   4/10 Admit  Consults:  Psychiatry  Procedures:    Significant Diagnostic Tests:  CT head 4/10 >> non diagnostic due to patient motion during study Abd u/s 4/10 >> hepatic steatosis  Micro Data:  SARS CoV2 PCR 4/10 >> negative Influenza PCR 4/10 >> negative Urine 4/10 >>  Antimicrobials:  Azactam 4/10 Bactrim 4/11 >>    Interim history/subjective:  She doesn't remember what happened.  Doesn't recall taking aleve PM or sleeping medicine.  She does feel anxious.  Objective   Blood pressure (!) 150/106, pulse (!) 102, temperature 98.3 F (36.8 C), temperature source Axillary, resp. rate 16, SpO2 98 %.        Intake/Output Summary (Last 24 hours) at 11/04/2019 1054 Last data filed at 11/03/2019 1214 Gross per 24 hour  Intake 97.9 ml  Output --  Net 97.9 ml   There were no vitals filed for this visit.  Examination:  General - alert Eyes - pupils reactive ENT - no sinus tenderness, no stridor Cardiac - regular rate/rhythm, no murmur Chest - equal breath sounds b/l, no wheezing or rales Abdomen - soft, non tender, + bowel sounds Extremities - no cyanosis, clubbing, or edema Skin - no rashes Neuro - normal strength, moves extremities, follows commands Psych - normal mood and behavior   Resolved Hospital Problem list     Assessment & Plan:   Seizure likely triggered by overdose and volume depletion. - continue keppra, lamictal - prn ativan  Intentional overdose with history of  depression and prior suicide attempt. - suicide precautions - await psychiatry assessment to determine disposition - prn ativan for anxiety  AKI from hypovolemia. Anion gap metabolic acidosis likely from lactic acidosis after seizure. - resolved - KVO IV fluid  Elevated blood pressure, sinus tachycardia. - no hx of HTN - might be related to anxiety - if remains persistently elevated, then consider adding a beta  blocker  Hypokalemia. - replace and f/u BMET  UTI. - day 2 of ABx, currently on bactrim  Elevated LFTs. - family reports concern for cirrhosis by her doctors in [place] - LFTs normal now and abd u/s showed fatty liver only  Hyperglycemia. - no hx of DM - improved 4/11 - f/u Hb A1C from 4/11  Hx of asthma. - prn albuterol  Best practice:  Diet: regular DVT prophylaxis: lovenox GI prophylaxis: Not indicated Mobility: bed rest Code Status: full code Disposition: med-surg  Labs:   CMP Latest Ref Rng & Units 11/04/2019 11/03/2019 09/30/2019  Glucose 70 - 99 mg/dL 85 155(H) 161(H)  BUN 6 - 20 mg/dL 7 10 8   Creatinine 0.44 - 1.00 mg/dL 0.57 1.11(H) 0.75  Sodium 135 - 145 mmol/L 140 142 142  Potassium 3.5 - 5.1 mmol/L 2.9(L) 3.5 4.3  Chloride 98 - 111 mmol/L 103 101 106  CO2 22 - 32 mmol/L 27 15(L) 20(L)  Calcium 8.9 - 10.3 mg/dL 8.6(L) 9.3 9.5  Total Protein 6.5 - 8.1 g/dL 6.3(L) 8.4(H) 8.5(H)  Total Bilirubin 0.3 - 1.2 mg/dL 0.7 0.8 0.6  Alkaline Phos 38 - 126 U/L 83 112 109  AST 15 - 41 U/L 32 54(H) 79(H)  ALT 0 - 44 U/L 34 45(H) 90(H)    CBC Latest Ref Rng & Units 11/04/2019 11/03/2019 09/30/2019  WBC 4.0 - 10.5 K/uL 11.7(H) 23.7(H) 14.1(H)  Hemoglobin 12.0 - 15.0 g/dL 12.7 15.4(H) 14.8  Hematocrit 36.0 - 46.0 % 38.8 49.0(H) 45.6  Platelets 150 - 400 K/uL 255 464(H) 257    Signature:   Chesley Mires, MD [place] Pager - [phone] 11/04/2019, 10:54 AM

## 2019-11-04 NOTE — Consult Note (Signed)
Telepsych Consultation   Reason for Consult: ''intentional overdose.'' Referring Physician:  Coralyn Helling, MD Location of Patient: [facility] Location of Provider: [facility]  Patient Identification: Jacqueline Jones MRN:  [id number] Principal Diagnosis: Major depressive disorder, recurrent episode, severe (HCC) Diagnosis:  Principal Problem:   Major depressive disorder, recurrent episode, severe (HCC) Active Problems:   Seizure (HCC)   Total Time spent with patient: 45 minutes  Subjective:   Jacqueline Jones is a 29 y.o. female patient admitted with intentional overdose.  HPI:  Patient with long history of Major depressive disorder, seizures and polysubstance abuse. Patient was brought to the hospital after she intentionally overdosed on 18 pills of Aleve and 3/4 of bottle of ''sleeptime sleep aide''. Patient confirmed taking the pills and sleep aide but unable to recall the how she got to the emergency room. However, patient reports worsening depressive symproms and unable to contract for safety. She reports at least 6 previous suicide attempts, was hospitalized in September 2020 for intentional baclofen overdose. She moved to [place] from [place] in December 2020. Smokes Cannabis occasionally but denies alcohol or other illicit drugs of abuse. UDS positive for THC.  Lactic acid 1.7, acetaminophen and salicylate levels negative.  Creatinine 1.11 (baseline 0.75 from 10/01/19).  Past Psychiatric History: as above  Risk to Self:  unable to contract for safety Risk to Others:  denies Prior Inpatient Therapy:  multiple in the past Prior Outpatient Therapy:   yes  Past Medical History:  Past Medical History:  Diagnosis Date  . Anxiety   . Anxiety   . Asthma   . Depression   . Depression   . Frequent headaches   . Infection    UTI  . Pneumonia   . Seizures (HCC)    unknown cause- started 08/14  "monthly"  . Sepsis (HCC)   . UTI (lower urinary tract infection)     No past surgical history on file. Family History:  Family History  Problem Relation Age of Onset  . Drug abuse Father   . Drug abuse Sister   . Drug abuse Brother    Family Psychiatric  History: Social History:  Social History   Substance and Sexual Activity  Alcohol Use No     Social History   Substance and Sexual Activity  Drug Use Not Currently  . Frequency: 7.0 times per week    Social History   Socioeconomic History  . Marital status: Married    Spouse name: Not on file  . Number of children: Not on file  . Years of education: Not on file  . Highest education level: Not on file  Occupational History  . Not on file  Tobacco Use  . Smoking status: Former Smoker    Packs/day: 0.25    Years: 10.00    Pack years: 2.50    Types: Cigarettes  . Smokeless tobacco: Former Neurosurgeon  . Tobacco comment: she is wanting to quit trying  Substance and Sexual Activity  . Alcohol use: No  . Drug use: Not Currently    Frequency: 7.0 times per week  . Sexual activity: Yes    Birth control/protection: None  Other Topics Concern  . Not on file  Social History Narrative   ** Merged History Encounter **       Social Determinants of Health   Financial Resource Strain:   . Difficulty of Paying Living Expenses:   Food Insecurity:   . Worried About Programme researcher, broadcasting/film/video in the  Last Year:   . Ran Out of Food in the Last Year:   Transportation Needs:   . Freight forwarder (Medical):   Marland Kitchen Lack of Transportation (Non-Medical):   Physical Activity:   . Days of Exercise per Week:   . Minutes of Exercise per Session:   Stress:   . Feeling of Stress :   Social Connections:   . Frequency of Communication with Friends and Family:   . Frequency of Social Gatherings with Friends and Family:   . Attends Religious Services:   . Active Member of Clubs or Organizations:   . Attends Banker Meetings:   Marland Kitchen Marital Status:    Additional Social History:    Allergies:    Allergies  Allergen Reactions  . Toradol [Ketorolac Tromethamine] Itching    seizure  . Flexeril [Cyclobenzaprine] Other (See Comments)    seizure  . Gabapentin Other (See Comments)    seizures   . Tramadol Itching    Seizures  . Vicodin [Hydrocodone-Acetaminophen] Itching    Pt states she can take Tylenol without difficulty.  . Adhesive [Tape] Rash  . Keflet [Cephalexin] Rash    Did it involve swelling of the face/tongue/throat, SOB, or low BP? No Did it involve sudden or severe rash/hives, skin peeling, or any reaction on the inside of your mouth or nose? No Did you need to seek medical attention at a hospital or doctor's office? No When did it last happen? If all above answers are "NO", may proceed with cephalosporin use.   . Latex Rash  . Levaquin [Levofloxacin In D5w] Rash    Labs:  Results for orders placed or performed during the hospital encounter of 11/03/19 (from the past 48 hour(s))  CBC with Differential/Platelet     Status: Abnormal   Collection Time: 11/03/19 10:32 AM  Result Value Ref Range   WBC 23.7 (H) 4.0 - 10.5 K/uL   RBC 5.25 (H) 3.87 - 5.11 MIL/uL   Hemoglobin 15.4 (H) 12.0 - 15.0 g/dL   HCT 57.3 (H) 22.0 - 25.4 %   MCV 93.3 80.0 - 100.0 fL   MCH 29.3 26.0 - 34.0 pg   MCHC 31.4 30.0 - 36.0 g/dL   RDW 27.0 62.3 - 76.2 %   Platelets 464 (H) 150 - 400 K/uL   nRBC 0.0 0.0 - 0.2 %   Neutrophils Relative % 75 %   Neutro Abs 17.8 (H) 1.7 - 7.7 K/uL   Lymphocytes Relative 18 %   Lymphs Abs 4.3 (H) 0.7 - 4.0 K/uL   Monocytes Relative 6 %   Monocytes Absolute 1.3 (H) 0.1 - 1.0 K/uL   Eosinophils Relative 0 %   Eosinophils Absolute 0.0 0.0 - 0.5 K/uL   Basophils Relative 0 %   Basophils Absolute 0.1 0.0 - 0.1 K/uL   Immature Granulocytes 1 %   Abs Immature Granulocytes 0.12 (H) 0.00 - 0.07 K/uL    Comment: Performed at [facility], [address], [place], [place] [address]  Comprehensive metabolic panel     Status: Abnormal    Collection Time: 11/03/19 10:32 AM  Result Value Ref Range   Sodium 142 135 - 145 mmol/L   Potassium 3.5 3.5 - 5.1 mmol/L    Comment: SLIGHT HEMOLYSIS   Chloride 101 98 - 111 mmol/L   CO2 15 (L) 22 - 32 mmol/L   Glucose, Bld 155 (H) 70 - 99 mg/dL    Comment: Glucose reference range applies only to samples  taken after fasting for at least 8 hours.   BUN 10 6 - 20 mg/dL   Creatinine, Ser 9.89 (H) 0.44 - 1.00 mg/dL   Calcium 9.3 8.9 - 21.1 mg/dL   Total Protein 8.4 (H) 6.5 - 8.1 g/dL   Albumin 4.4 3.5 - 5.0 g/dL   AST 54 (H) 15 - 41 U/L    Comment: RESULTS CONFIRMED BY MANUAL DILUTION   ALT 45 (H) 0 - 44 U/L    Comment: RESULTS CONFIRMED BY MANUAL DILUTION   Alkaline Phosphatase 112 38 - 126 U/L   Total Bilirubin 0.8 0.3 - 1.2 mg/dL   GFR calc non Af Amer >60 >60 mL/min   GFR calc Af Amer >60 >60 mL/min   Anion gap 26 (H) 5 - 15    Comment: Performed at [facility], [address] [place], [place] [address]  Respiratory Panel by RT PCR (Flu A&B, Covid) - Nasopharyngeal Swab     Status: None   Collection Time: 11/03/19 11:18 AM   Specimen: Nasopharyngeal Swab  Result Value Ref Range   SARS Coronavirus 2 by RT PCR NEGATIVE NEGATIVE    Comment: (NOTE) SARS-CoV-2 target nucleic acids are NOT DETECTED. The SARS-CoV-2 RNA is generally detectable in upper respiratoy specimens during the acute phase of infection. The lowest concentration of SARS-CoV-2 viral copies this assay can detect is 131 copies/mL. A negative result does not preclude SARS-Cov-2 infection and should not be used as the sole basis for treatment or other patient management decisions. A negative result may occur with  improper specimen collection/handling, submission of specimen other than nasopharyngeal swab, presence of viral mutation(s) within the areas targeted by this assay, and inadequate number of viral copies (<131 copies/mL). A negative result must be combined with clinical observations,  patient history, and epidemiological information. The expected result is Negative. Fact Sheet for Patients:  https://www.moore.com/ Fact Sheet for Healthcare Providers:  https://www.young.biz/ This test is not yet ap proved or cleared by the [place] FDA and  has been authorized for detection and/or diagnosis of SARS-CoV-2 by FDA under an Emergency Use Authorization (EUA). This EUA will remain  in effect (meaning this test can be used) for the duration of the COVID-19 declaration under Section 564(b)(1) of the Act, 21 U.S.C. section 360bbb-3(b)(1), unless the authorization is terminated or revoked sooner.    Influenza A by PCR NEGATIVE NEGATIVE   Influenza B by PCR NEGATIVE NEGATIVE    Comment: (NOTE) The Xpert Xpress SARS-CoV-2/FLU/RSV assay is intended as an aid in  the diagnosis of influenza from Nasopharyngeal swab specimens and  should not be used as a sole basis for treatment. Nasal washings and  aspirates are unacceptable for Xpert Xpress SARS-CoV-2/FLU/RSV  testing. Fact Sheet for Patients: https://www.moore.com/ Fact Sheet for Healthcare Providers: https://www.young.biz/ This test is not yet approved or cleared by the [place] FDA and  has been authorized for detection and/or diagnosis of SARS-CoV-2 by  FDA under an Emergency Use Authorization (EUA). This EUA will remain  in effect (meaning this test can be used) for the duration of the  Covid-19 declaration under Section 564(b)(1) of the Act, 21  U.S.C. section 360bbb-3(b)(1), unless the authorization is  terminated or revoked. Performed at [facility], [address] [place], [place] [address]   Urinalysis, Routine w reflex microscopic     Status: Abnormal   Collection Time: 11/03/19 11:18 AM  Result Value Ref Range   Color, Urine YELLOW YELLOW   APPearance  CLOUDY (A) CLEAR   Specific Gravity, Urine 1.020  1.005 - 1.030   pH 5.0 5.0 - 8.0   Glucose, UA NEGATIVE NEGATIVE mg/dL   Hgb urine dipstick LARGE (A) NEGATIVE   Bilirubin Urine NEGATIVE NEGATIVE   Ketones, ur NEGATIVE NEGATIVE mg/dL   Protein, ur 161100 (A) NEGATIVE mg/dL   Nitrite POSITIVE (A) NEGATIVE   Leukocytes,Ua TRACE (A) NEGATIVE   RBC / HPF 21-50 0 - 5 RBC/hpf   WBC, UA 6-10 0 - 5 WBC/hpf   Bacteria, UA MANY (A) NONE SEEN   Squamous Epithelial / LPF 6-10 0 - 5    Comment: Performed at [facility], [address] [place], [place] [address]  Rapid urine drug screen (hospital performed)     Status: Abnormal   Collection Time: 11/03/19 11:18 AM  Result Value Ref Range   Opiates NONE DETECTED NONE DETECTED   Cocaine NONE DETECTED NONE DETECTED   Benzodiazepines NONE DETECTED NONE DETECTED   Amphetamines NONE DETECTED NONE DETECTED   Tetrahydrocannabinol POSITIVE (A) NONE DETECTED   Barbiturates NONE DETECTED NONE DETECTED    Comment: (NOTE) DRUG SCREEN FOR MEDICAL PURPOSES ONLY.  IF CONFIRMATION IS NEEDED FOR ANY PURPOSE, NOTIFY LAB WITHIN 5 DAYS. LOWEST DETECTABLE LIMITS FOR URINE DRUG SCREEN Drug Class                     Cutoff (ng/mL) Amphetamine and metabolites    1000 Barbiturate and metabolites    200 Benzodiazepine                 200 Tricyclics and metabolites     300 Opiates and metabolites        300 Cocaine and metabolites        300 THC                            50 Performed at [facility], [address] [place], [place] [address]   Lipase, blood     Status: None   Collection Time: 11/03/19 11:27 AM  Result Value Ref Range   Lipase 24 11 - 51 U/L    Comment: Performed at [facility], [address] [place], [place] [address]  Acetaminophen level     Status: Abnormal   Collection Time: 11/03/19  1:58 PM  Result Value Ref Range   Acetaminophen (Tylenol), Serum <10 (L) 10 - 30 ug/mL    Comment: (NOTE) Therapeutic concentrations vary  significantly. A range of 10-30 ug/mL  may be an effective concentration for many patients. However, some  are best treated at concentrations outside of this range. Acetaminophen concentrations >150 ug/mL at 4 hours after ingestion  and >50 ug/mL at 12 hours after ingestion are often associated with  toxic reactions. Performed at [facility], [address] [place], [place] [address]   Ethanol     Status: None   Collection Time: 11/03/19  1:58 PM  Result Value Ref Range   Alcohol, Ethyl (B) <10 <10 mg/dL    Comment: (NOTE) Lowest detectable limit for serum alcohol is 10 mg/dL. For medical purposes only. Performed at [facility], [address] [place], [place] [address]   Salicylate level     Status: Abnormal   Collection Time: 11/03/19  1:58 PM  Result Value Ref Range   Salicylate Lvl <7.0 (L) 7.0 - 30.0 mg/dL    Comment:  Performed at [facility], [address] [place], [place] [address]  Lactic acid, plasma     Status: None   Collection Time: 11/03/19  2:04 PM  Result Value Ref Range   Lactic Acid, Venous 1.7 0.5 - 1.9 mmol/L    Comment: Performed at [facility], [address], [place], [place] [address]  Magnesium     Status: None   Collection Time: 11/03/19  2:08 PM  Result Value Ref Range   Magnesium 2.1 1.7 - 2.4 mg/dL    Comment: Performed at [facility], [address], [place], [place] [address]  CBC     Status: Abnormal   Collection Time: 11/04/19  4:50 AM  Result Value Ref Range   WBC 11.7 (H) 4.0 - 10.5 K/uL   RBC 4.25 3.87 - 5.11 MIL/uL   Hemoglobin 12.7 12.0 - 15.0 g/dL   HCT 72.9 02.1 - 11.5 %   MCV 91.3 80.0 - 100.0 fL   MCH 29.9 26.0 - 34.0 pg   MCHC 32.7 30.0 - 36.0 g/dL   RDW 52.0 (H) 80.2 - 23.3 %   Platelets 255 150 - 400 K/uL   nRBC 0.0 0.0 - 0.2 %    Comment: Performed at [facility], [address], [place], [place]  [address]  Comprehensive metabolic panel     Status: Abnormal   Collection Time: 11/04/19  4:50 AM  Result Value Ref Range   Sodium 140 135 - 145 mmol/L   Potassium 2.9 (L) 3.5 - 5.1 mmol/L    Comment: DELTA CHECK NOTED   Chloride 103 98 - 111 mmol/L   CO2 27 22 - 32 mmol/L   Glucose, Bld 85 70 - 99 mg/dL    Comment: Glucose reference range applies only to samples taken after fasting for at least 8 hours.   BUN 7 6 - 20 mg/dL   Creatinine, Ser 4.97 0.44 - 1.00 mg/dL   Calcium 8.6 (L) 8.9 - 10.3 mg/dL   Total Protein 6.3 (L) 6.5 - 8.1 g/dL   Albumin 3.3 (L) 3.5 - 5.0 g/dL   AST 32 15 - 41 U/L   ALT 34 0 - 44 U/L   Alkaline Phosphatase 83 38 - 126 U/L   Total Bilirubin 0.7 0.3 - 1.2 mg/dL   GFR calc non Af Amer >60 >60 mL/min   GFR calc Af Amer >60 >60 mL/min   Anion gap 10 5 - 15    Comment: Performed at [facility], [address], [place], [place] [address]  Magnesium     Status: None   Collection Time: 11/04/19  4:50 AM  Result Value Ref Range   Magnesium 2.1 1.7 - 2.4 mg/dL    Comment: Performed at [facility], [address] [place], [place] [address]  Phosphorus     Status: None   Collection Time: 11/04/19  4:50 AM  Result Value Ref Range   Phosphorus 4.4 2.5 - 4.6 mg/dL    Comment: Performed at [facility], [address], [place], [place] [address]    Medications:  Current Facility-Administered Medications  Medication Dose Route Frequency Provider Last Rate Last Admin  . 0.9 %  sodium chloride infusion   Intravenous Continuous Coralyn Helling, MD 75 mL/hr at 11/03/19 1602 New Bag at 11/03/19 1602  . albuterol (PROVENTIL) (2.5 MG/3ML) 0.083% nebulizer solution 2.5 mg  2.5 mg Nebulization Q6H PRN Coralyn Helling, MD      .  enoxaparin (LOVENOX) injection 40 mg  40 mg Subcutaneous Q24H Chesley Mires, MD   40 mg at 11/03/19 2227  . hydrOXYzine (ATARAX/VISTARIL) tablet 25 mg  25 mg Oral Q6H PRN Chesley Mires, MD   25 mg at  11/03/19 1602  . lamoTRIgine (LAMICTAL) tablet 25 mg  25 mg Oral BID Chesley Mires, MD   25 mg at 11/04/19 1039  . levETIRAcetam (KEPPRA) tablet 750 mg  750 mg Oral BID Chesley Mires, MD   750 mg at 11/04/19 1039  . LORazepam (ATIVAN) injection 2 mg  2 mg Intravenous Q4H PRN Chesley Mires, MD   2 mg at 11/04/19 0741  . MEDLINE mouth rinse  15 mL Mouth Rinse BID Chesley Mires, MD   15 mL at 11/03/19 2228  . potassium chloride SA (KLOR-CON) CR tablet 40 mEq  40 mEq Oral Once Chesley Mires, MD      . promethazine (PHENERGAN) tablet 25 mg  25 mg Oral Q6H PRN Chesley Mires, MD   25 mg at 11/03/19 2027  . sulfamethoxazole-trimethoprim (BACTRIM DS) 800-160 MG per tablet 1 tablet  1 tablet Oral Q12H Chesley Mires, MD       Current Outpatient Medications  Medication Sig Dispense Refill  . albuterol (PROVENTIL HFA;VENTOLIN HFA) 108 (90 BASE) MCG/ACT inhaler Inhale 2 puffs into the lungs every 6 (six) hours as needed for wheezing or shortness of breath.     . hydrOXYzine (ATARAX/VISTARIL) 25 MG tablet Take 1 tablet (25 mg total) by mouth every 6 (six) hours as needed for anxiety, nausea or vomiting. 12 tablet 0  . lamoTRIgine (LAMICTAL) 25 MG tablet Take 25 mg by mouth 2 (two) times daily.    Marland Kitchen levETIRAcetam (KEPPRA) 750 MG tablet Take 750 mg by mouth 2 (two) times daily.    . promethazine (PHENERGAN) 25 MG tablet Take 1 tablet (25 mg total) by mouth every 6 (six) hours as needed for nausea or vomiting. 8 tablet 0    Musculoskeletal: Strength & Muscle Tone: not tested, patient assessed via tele health Richardson: not tested, patient assessed via tele health Patient leans: N/A  Psychiatric Specialty Exam: Physical Exam  Psychiatric: Her affect is blunt. Her speech is delayed. She is slowed and withdrawn. Cognition and memory are normal. She expresses impulsivity. She exhibits a depressed mood. She expresses suicidal ideation.    Review of Systems  Constitutional: Positive for fatigue.  HENT: Negative.    Eyes: Negative.   Respiratory: Negative.   Cardiovascular: Negative.   Gastrointestinal: Negative.   Endocrine: Negative.   Genitourinary: Negative.   Skin: Negative.   Allergic/Immunologic: Negative.   Neurological: Negative.   Psychiatric/Behavioral: Positive for dysphoric mood.    Blood pressure (!) 150/106, pulse (!) 102, temperature 98.3 F (36.8 C), temperature source Axillary, resp. rate 16, SpO2 98 %.There is no height or weight on file to calculate BMI.  General Appearance: Casual  Eye Contact:  Minimal  Speech:  Clear and Coherent and Slow  Volume:  Decreased  Mood:  Dysphoric  Affect:  Constricted  Thought Process:  Coherent and Linear  Orientation:  Full (Time, Place, and Person)  Thought Content:  Logical  Suicidal Thoughts:  Yes.  without intent/plan  Homicidal Thoughts:  No  Memory:  Immediate;   Fair Recent;   Fair Remote;   Good  Judgement:  Poor  Insight:  Lacking  Psychomotor Activity:  Psychomotor Retardation  Concentration:  Concentration: Fair and Attention Span: Fair  Recall:  [employer] of  Knowledge:  Fair  Language:  Good  Akathisia:  No  Handed:  Right  AIMS (if indicated):     Assets:  Communication Skills Social Support  ADL's:  Intact  Cognition:  WNL  Sleep:   fair     Treatment Plan Summary: 29 year old female with history of seizure disorder, major depression who was admitted after she allegedly intentionally overdosed. Patient reports worsening depression and she is unable to contract for safety at this time. Patient will benefit from inpatient psychiatric admission after she is medically cleared.  Recommendations: -1:1 sitter for safety -Continue Lamictal 25 mg twice daily for mood -Consider social worker consult to facilitate psychiatric inpatient placement after patient is medically cleared. -psychiatric service signing off. Re-consult as needed.  Disposition: Recommend psychiatric Inpatient admission when medically  cleared. Supportive therapy provided about ongoing stressors.  This service was provided via telemedicine using a 2-way, interactive audio and video technology.  Names of all persons participating in this telemedicine service and their role in this encounter. Name: Lilybeth Vien Role: Patient  Name: Thedore Mins, MD Role: Psychiatrist    Thedore Mins, MD 11/04/2019 11:21 AM

## 2019-11-04 NOTE — ED Notes (Signed)
Pt lying in bed, eye's closed, chest rising and falling. Will continue to monitor.  

## 2019-11-04 NOTE — ED Notes (Signed)
Pt lying in bed, eye's closed, chest rising and falling. Sitter at bedside. Will continue to monitor.  

## 2019-11-04 NOTE — ED Notes (Signed)
Pt lying in bed eye's closed. Awakens easily. Pt still has AMS upon wakening. Will continue to monitor.

## 2019-11-04 NOTE — ED Notes (Signed)
Pt lying in bed, eye's closed, chest rising and falling. Full monitor on. NAD noted. Will continue to monitor.  

## 2019-11-04 NOTE — ED Notes (Signed)
Pt up to the bedside commode. Linen changed. Full monitor on. Soda given. Will continue to monitor pt.

## 2019-11-05 ENCOUNTER — Inpatient Hospital Stay
Admission: AD | Admit: 2019-11-05 | Discharge: 2019-11-06 | DRG: 885 | Disposition: A | Discharge status: Home / Self Care | Payer: Medicaid Other | Source: Intra-hospital | Attending: Psychiatry | Admitting: Psychiatry

## 2019-11-05 DIAGNOSIS — J45909 Unspecified asthma, uncomplicated: Secondary | ICD-10-CM | POA: Diagnosis present

## 2019-11-05 DIAGNOSIS — Z87891 Personal history of nicotine dependence: Secondary | ICD-10-CM

## 2019-11-05 DIAGNOSIS — G479 Sleep disorder, unspecified: Secondary | ICD-10-CM | POA: Diagnosis present

## 2019-11-05 DIAGNOSIS — T4271XD Poisoning by unspecified antiepileptic and sedative-hypnotic drugs, accidental (unintentional), subsequent encounter: Secondary | ICD-10-CM | POA: Diagnosis not present

## 2019-11-05 DIAGNOSIS — F322 Major depressive disorder, single episode, severe without psychotic features: Secondary | ICD-10-CM | POA: Diagnosis present

## 2019-11-05 DIAGNOSIS — Z9109 Other allergy status, other than to drugs and biological substances: Secondary | ICD-10-CM | POA: Diagnosis not present

## 2019-11-05 DIAGNOSIS — F332 Major depressive disorder, recurrent severe without psychotic features: Secondary | ICD-10-CM

## 2019-11-05 DIAGNOSIS — Z915 Personal history of self-harm: Secondary | ICD-10-CM

## 2019-11-05 DIAGNOSIS — Z888 Allergy status to other drugs, medicaments and biological substances status: Secondary | ICD-10-CM

## 2019-11-05 DIAGNOSIS — Z885 Allergy status to narcotic agent status: Secondary | ICD-10-CM

## 2019-11-05 DIAGNOSIS — F411 Generalized anxiety disorder: Secondary | ICD-10-CM | POA: Diagnosis present

## 2019-11-05 DIAGNOSIS — Z813 Family history of other psychoactive substance abuse and dependence: Secondary | ICD-10-CM | POA: Diagnosis not present

## 2019-11-05 DIAGNOSIS — G40909 Epilepsy, unspecified, not intractable, without status epilepticus: Secondary | ICD-10-CM | POA: Diagnosis present

## 2019-11-05 DIAGNOSIS — Z881 Allergy status to other antibiotic agents status: Secondary | ICD-10-CM | POA: Diagnosis not present

## 2019-11-05 DIAGNOSIS — Z91040 Latex allergy status: Secondary | ICD-10-CM

## 2019-11-05 DIAGNOSIS — R569 Unspecified convulsions: Secondary | ICD-10-CM

## 2019-11-05 DIAGNOSIS — T4271XA Poisoning by unspecified antiepileptic and sedative-hypnotic drugs, accidental (unintentional), initial encounter: Secondary | ICD-10-CM

## 2019-11-05 LAB — URINE CULTURE: Culture: >=100000 — AB

## 2019-11-05 LAB — HEMOGLOBIN A1C
Hgb A1c MFr Bld: 5.6 % (ref 4.8–5.6)
Mean Plasma Glucose: 114 mg/dL

## 2019-11-05 LAB — BASIC METABOLIC PANEL
Anion gap: 13 (ref 5–15)
BUN: 8 mg/dL (ref 6–20)
CO2: 26 mmol/L (ref 22–32)
Calcium: 8.5 mg/dL — ABNORMAL LOW (ref 8.9–10.3)
Chloride: 101 mmol/L (ref 98–111)
Creatinine, Ser: 0.75 mg/dL (ref 0.44–1.00)
GFR calc Af Amer: >60 mL/min (ref >60)
GFR calc non Af Amer: >60 mL/min (ref >60)
Glucose, Bld: 86 mg/dL (ref 70–99)
Potassium: 3.3 mmol/L — ABNORMAL LOW (ref 3.5–5.1)
Sodium: 140 mmol/L (ref 135–145)

## 2019-11-05 LAB — MAGNESIUM: Magnesium: 1.9 mg/dL (ref 1.7–2.4)

## 2019-11-05 MED ORDER — LAMOTRIGINE 25 MG PO TABS
25.0000 mg | ORAL_TABLET | Freq: Two times a day (BID) | ORAL | Status: DC
  Administered 2019-11-05 – 2019-11-06 (×2): 25 mg via ORAL
  Filled 2019-11-05 (×2): qty 1

## 2019-11-05 MED ORDER — HYDROXYZINE HCL 25 MG PO TABS
25.0000 mg | ORAL_TABLET | Freq: Three times a day (TID) | ORAL | Status: DC | PRN
  Administered 2019-11-05 – 2019-11-06 (×2): 25 mg via ORAL
  Filled 2019-11-05 (×2): qty 1

## 2019-11-05 MED ORDER — POTASSIUM CHLORIDE CRYS ER 20 MEQ PO TBCR
40.0000 meq | EXTENDED_RELEASE_TABLET | Freq: Once | ORAL | Status: AC
  Administered 2019-11-05: 40 meq via ORAL
  Filled 2019-11-05: qty 2

## 2019-11-05 MED ORDER — LEVETIRACETAM 750 MG PO TABS: 750.0000 mg | ORAL_TABLET | Freq: Two times a day (BID) | ORAL | Status: DC

## 2019-11-05 MED ORDER — LEVETIRACETAM 750 MG PO TABS
750.0000 mg | ORAL_TABLET | Freq: Two times a day (BID) | ORAL | Status: DC
  Administered 2019-11-05 – 2019-11-06 (×2): 750 mg via ORAL
  Filled 2019-11-05 (×4): qty 1

## 2019-11-05 MED ORDER — ALUM & MAG HYDROXIDE-SIMETH 200-200-20 MG/5ML PO SUSP: 30.0000 mL | ORAL | Status: DC | PRN

## 2019-11-05 MED ORDER — MAGNESIUM HYDROXIDE 400 MG/5ML PO SUSP: 30.0000 mL | Freq: Every day | ORAL | Status: DC | PRN

## 2019-11-05 MED ORDER — LORAZEPAM 2 MG/ML IJ SOLN: 2.0000 mg | INTRAMUSCULAR | Status: DC | PRN

## 2019-11-05 MED ORDER — SULFAMETHOXAZOLE-TRIMETHOPRIM 800-160 MG PO TABS
1.0000 | ORAL_TABLET | Freq: Two times a day (BID) | ORAL | Status: DC
  Administered 2019-11-05 – 2019-11-06 (×2): 1 via ORAL
  Filled 2019-11-05 (×2): qty 1

## 2019-11-05 MED ORDER — TRAZODONE HCL 50 MG PO TABS
50.0000 mg | ORAL_TABLET | Freq: Every evening | ORAL | Status: DC | PRN
  Administered 2019-11-05: 50 mg via ORAL
  Filled 2019-11-05: qty 1

## 2019-11-05 MED ORDER — ALBUTEROL SULFATE (2.5 MG/3ML) 0.083% IN NEBU: 2.5000 mg | INHALATION_SOLUTION | Freq: Four times a day (QID) | RESPIRATORY_TRACT | Status: DC | PRN

## 2019-11-05 MED ORDER — SULFAMETHOXAZOLE-TRIMETHOPRIM 800-160 MG PO TABS: 1.0000 | ORAL_TABLET | Freq: Two times a day (BID) | ORAL | Status: DC

## 2019-11-05 NOTE — BH Assessment (Signed)
Late Entry  Patient has been accepted to [facility].  Accepted by Nurse Practitioner [facility], on the behalf of Dr. Toni Amend..  Attending  Physician will be Dr. Toni Amend.  Patient has been assigned to room 316, by [facility] Charge Nurse Gigi, RN   Call report to [phone].  Representative/Transfer Coordinator is Wane Mollett Patient pre-admitted by [facility] Patient Access ([place])   [facility] Staff Arna Medici, [facility] CM/SW) made aware of acceptance.

## 2019-11-05 NOTE — TOC Initial Note (Addendum)
Transition of Care [facility]) - Initial/Assessment Note    Patient Details  Name: Jacqueline Jones MRN: [id number] Date of Birth: June 28, 1991  [facility] CM/SW Contact:    Bartholome Bill, RN Phone Number: 11/05/2019, 1:09 PM  Clinical Narrative:                 CM consult for inpatient psych placement. This CM contacted [facility] for review of pt chart for placement. TOC will continue to follow.   Expected Discharge Plan: Psychiatric Hospital Barriers to Discharge: Other (comment)(Waiting on inpatient psych bed availability) Expected Discharge Plan and Services Expected Discharge Plan: Psychiatric Hospital   Discharge Planning Services: CM Consult     Expected Discharge Date: 11/05/19                    Activities of Daily Living Home Assistive Devices/Equipment: None ADL Screening (condition at time of admission) Patient's cognitive ability adequate to safely complete daily activities?: Yes Is the patient deaf or have difficulty hearing?: No Does the patient have difficulty seeing, even when wearing glasses/contacts?: No Does the patient have difficulty concentrating, remembering, or making decisions?: Yes Patient able to express need for assistance with ADLs?: Yes Does the patient have difficulty dressing or bathing?: No Independently performs ADLs?: Yes (appropriate for developmental age) Does the patient have difficulty walking or climbing stairs?: No Weakness of Legs: None Weakness of Arms/Hands: None  Admission diagnosis:  Seizure (HCC) [R56.9] Elevated LFTs [R79.89] Patient Active Problem List   Diagnosis Date Noted  . Major depressive disorder, recurrent episode, severe (HCC) 11/04/2019  . Seizure (HCC) 11/03/2019  . Medical non-compliance 05/13/2014  . Neurologic abnormality 05/13/2014  . Folliculitis 05/13/2014  . UTI (lower urinary tract infection) 02/18/2014  . Pupil asymmetry 02/05/2014  . Polysubstance dependence (HCC) 02/28/2012  . GAD  (generalized anxiety disorder) 09/02/2011   PCP:  System, Pcp Not In Pharmacy:   [id number] - [place], [place] - [address] AT [address] [place] [address] Phone: [phone] Fax: [phone]  [facility] - [place], [place] - [address] [place] [address] Phone: [phone] Fax: [phone]     Social Determinants of Health (SDOH) Interventions    Readmission Risk Interventions No flowsheet data found.

## 2019-11-05 NOTE — Plan of Care (Signed)
Patient acclimating to the unit.  Problem: Health Behavior/Discharge Planning: Goal: Ability to make decisions will improve Outcome: Progressing

## 2019-11-05 NOTE — [facility] Group Notes (Signed)
[facility] Group Notes:  (Nursing/MHT/Case Management/Adjunct)  Date:  11/05/2019  Time:  9:59 PM  Type of Therapy:  Group Therapy  Participation Level:  Did Not Attend    Landry Mellow 11/05/2019, 9:59 PM

## 2019-11-05 NOTE — Progress Notes (Signed)
The patient accepted medications as ordered, talked on the phone with her husband, and fell asleep early.  She was pleasant upon contact, asking how long she needed to stay in the hospital.  Jacqueline Jones displayed a flat affect and limited insight.  She denied taking an overdose in a suicide attempt and voiced that she had been struggling with poor sleep.  Energy levels and mood appeared depressed.  No unsafe or unusual behaviors observed.    Jacqueline Jones did not eat dinner.  Medication to help with anxiety was requested and provided prior to bedtime.  No seizure activity noted.   15-minute safety checks maintained.

## 2019-11-05 NOTE — Discharge Summary (Signed)
Physician Discharge Summary         Patient ID: Jacqueline Jones MRN: [id number] DOB/AGE: [birth year] 29 y.o.  Admit date: 11/03/2019 Discharge date: 11/05/2019  Discharge Diagnoses:    Intentional overdose History of depression and prior suicidal attempts Seizure Acute kidney injury (resolved) Hypertension Hypokalemia Urinary tract infection Abnormal liver function test Hyperglycemia History of asthma  Discharge summary    29 year old female, former smoker, history of depression and seizure disorder as well as polysubstance abuse.  Per her spouse had had more problems with depression as of late.  Had a witnessed seizure lasting 5 minutes at home, EMS called, she had another 2-minute seizure witnessed.  She apparently had taken Aleve p.m.  Several doses, had been purchased earlier in the week, several of these were missing from the medication bottle, also had purchased some over-the-counter "sleep time sleep aid" in liquid form.  During drug screen was positive for THC.  Is admitted to the intensive care for treatment of seizures, IV hydration, and supportive care. Diagnostic evaluation was positive for:  -Mild AKI, felt secondary to volume depletion. -Mild lactic acidosis felt secondary to seizure this is resolved -Mild tachycardia and hypertension felt situational and resolved -LFTs normalized Treated with IV Keppra, IV hydration, fluid electrolytes monitored. When she was stabilized psychiatry was consulted, They felt she would benefit from inpatient psychiatric admission.  As of 4/12 she is cleared for discharge to inpatient psychiatry   Discharge Plan by Active Problems   Polysubstance overdose in setting of history of depression Plan  now cleared for inpatient psychiatric admission Continue Lamictal  Seizure disorder Plan Continue Keppra  Urinary tract infection Plan Complete 3 days of Bactrim  Significant Hospital tests/ studies   Procedures    Culture  data/antimicrobials      Consults    psychiatry   Discharge Exam: Blood Pressure 123/85 (BP Location: Left Arm)   Pulse 88   Temperature 98.7 F (37.1 C) (Oral)   Respiration 18   Oxygen Saturation 94%   General 29 year old female resting in bed no acute distress HEENT normocephalic atraumatic no jugular venous distention Pulmonary clear to auscultation without accessory use currently on room air Neuro intact Cardiac regular rate and rhythm Extremities warm dry GU voids Labs at discharge   Lab Results  Component Value Date   CREATININE 0.75 11/05/2019   BUN 8 11/05/2019   NA 140 11/05/2019   K 3.3 (L) 11/05/2019   CL 101 11/05/2019   CO2 26 11/05/2019   Lab Results  Component Value Date   WBC 11.7 (H) 11/04/2019   HGB 12.7 11/04/2019   HCT 38.8 11/04/2019   MCV 91.3 11/04/2019   PLT 255 11/04/2019   Lab Results  Component Value Date   ALT 34 11/04/2019   AST 32 11/04/2019   ALKPHOS 83 11/04/2019   BILITOT 0.7 11/04/2019   No results found for: INR, PROTIME  Current radiological studies    CT Head Wo Contrast  Result Date: 11/03/2019 CLINICAL DATA:  Seizure. EXAM: CT HEAD WITHOUT CONTRAST TECHNIQUE: Contiguous axial images were obtained from the base of the skull through the vertex without intravenous contrast. COMPARISON:  CT head 07/10/2013 FINDINGS: Brain: Images from the upper portion of the calvarium through the skull base are uninterpretable due to gross patient motion. The first 6 images of the study show no gross abnormality. Vascular: Not assessed Skull: No gross abnormality. Study markedly limited by gross patient motion as discussed. Sinuses/Orbits: Not well evaluated, no gross fluid  level. Other: None IMPRESSION: Study markedly limited by gross patient motion as discussed. Essentially nondiagnostic. If there is continued concern for acute intracranial process repeat imaging is suggested. Reportedly the patient was combative during the exam. Some effort  at sedation or restraint may be warranted as possible if repeat imaging is performed. Electronically Signed   By: Donzetta Kohut M.D.   On: 11/03/2019 14:56   US Abdomen Complete  Result Date: 11/03/2019 CLINICAL DATA:  Abdominal pain x1 week, elevated LFTs EXAM: ABDOMEN ULTRASOUND COMPLETE COMPARISON:  CT abdomen/pelvis dated 04/21/2013 FINDINGS: Gallbladder: No gallstones, gallbladder wall thickening, or pericholecystic fluid. Negative sonographic Murphy's sign. Common bile duct: Diameter: 4 mm Liver: There is hyperechoic hepatic parenchyma, suggesting hepatic steatosis. No focal hepatic lesion is seen. Portal vein is patent on color Doppler imaging with normal direction of blood flow towards the liver. IVC: No abnormality visualized. Pancreas: Poorly visualized due to overlying bowel gas. Spleen: Size and appearance within normal limits. Right Kidney: Length: 10.9 cm. Echogenicity within normal limits. No mass or hydronephrosis visualized. Left Kidney: Length: 11.2 cm. Poorly visualized. Echogenicity within normal limits. No mass or hydronephrosis visualized. Abdominal aorta: No aneurysm visualized. Other findings: None. IMPRESSION: Hepatic steatosis. Otherwise negative abdominal ultrasound. Electronically Signed   By: Charline Bills M.D.   On: 11/03/2019 18:38    Disposition:    Discharge disposition: [facility] Not Defined       Discharge Instructions    Diet - low sodium heart healthy   Complete by: As directed    Increase activity slowly   Complete by: As directed       Allergies as of 11/05/2019    Allergen Reactions Comment   Toradol [ketorolac Tromethamine] Itching seizure   Flexeril [cyclobenzaprine] Other (See Comments) seizure   Gabapentin Other (See Comments) seizures   Tramadol Itching Seizures   Vicodin [hydrocodone-acetaminophen] Itching Pt states she can take Tylenol without difficulty.   Adhesive [tape] Rash    Keflet [cephalexin] Rash Did  it involve swelling of the face/tongue/throat, SOB, or low BP? No Did it involve sudden or severe rash/hives, skin peeling, or any reaction on the inside of your mouth or nose? No Did you need to seek medical attention at a hospital or doctor's office? No When did it last happen? If all above answers are "NO", may proceed with cephalosporin use.   Latex Rash    Levaquin [levofloxacin In D5w] Rash       Medication List    Stop taking these medications   hydrOXYzine 25 MG tablet Commonly known as: ATARAX/VISTARIL     Take these medications   albuterol 108 (90 Base) MCG/ACT inhaler Commonly known as: VENTOLIN HFA Inhale 2 puffs into the lungs every 6 (six) hours as needed for wheezing or shortness of breath.   lamoTRIgine 25 MG tablet Commonly known as: LAMICTAL Take 25 mg by mouth 2 (two) times daily.   levETIRAcetam 750 MG tablet Commonly known as: KEPPRA Take 1 tablet (750 mg total) by mouth 2 (two) times daily.   promethazine 25 MG tablet Commonly known as: PHENERGAN Take 1 tablet (25 mg total) by mouth every 6 (six) hours as needed for nausea or vomiting.   sulfamethoxazole-trimethoprim 800-160 MG tablet Commonly known as: BACTRIM DS Take 1 tablet by mouth every 12 (twelve) hours for 3 days.        Follow-up appointment   NA Discharge Condition:    good  Physician Statement:   The Patient was personally  examined, the discharge assessment and plan has been personally reviewed and I agree with ACNP Safir Michalec's assessment and plan. 32  minutes of time have been dedicated to discharge assessment, planning and discharge instructions.   Signed: Shelby Mattocks 11/05/2019, 12:11 PM Simonne Martinet ACNP-BC [facility] Pulmonary/Critical Care Pager # [phone] OR # [phone] if no answer

## 2019-11-05 NOTE — TOC Transition Note (Signed)
Transition of Care [facility]) - CM/SW Discharge Note   Patient Details  Name: Jacqueline Jones MRN: [id number] Date of Birth: 12/28/1990  [facility] CM/SW Contact:  Bartholome Bill, RN Phone Number: 11/05/2019, 4:24 PM   Clinical Narrative:    This CM was contacted by [facility] intake department with a room at inpatient psych for pt. [name] is to go to room 316. Transportation phone number contacted for transport. Pt has signed voluntary paperwork to go to [facility] voluntarily.   Final next level of care: Psychiatric Hospital Barriers to Discharge: No Barriers Identified      Discharge Plan and Services   Discharge Planning Services: CM Consult                Social Determinants of Health (SDOH) Interventions     Readmission Risk Interventions No flowsheet data found.

## 2019-11-05 NOTE — Progress Notes (Signed)
Report called to [facility].

## 2019-11-06 ENCOUNTER — Encounter: Payer: Self-pay | Admitting: Psychiatry

## 2019-11-06 DIAGNOSIS — F322 Major depressive disorder, single episode, severe without psychotic features: Principal | ICD-10-CM

## 2019-11-06 DIAGNOSIS — T4271XA Poisoning by unspecified antiepileptic and sedative-hypnotic drugs, accidental (unintentional), initial encounter: Secondary | ICD-10-CM

## 2019-11-06 DIAGNOSIS — T4271XD Poisoning by unspecified antiepileptic and sedative-hypnotic drugs, accidental (unintentional), subsequent encounter: Secondary | ICD-10-CM

## 2019-11-06 MED ORDER — ALBUTEROL SULFATE HFA 108 (90 BASE) MCG/ACT IN AERS: 2.0000 | INHALATION_SPRAY | Freq: Four times a day (QID) | RESPIRATORY_TRACT | 0 refills | Status: DC | PRN

## 2019-11-06 MED ORDER — LAMOTRIGINE 25 MG PO TABS: 25.0000 mg | ORAL_TABLET | Freq: Two times a day (BID) | ORAL | 0 refills | Status: DC

## 2019-11-06 MED ORDER — HYDROXYZINE HCL 25 MG PO TABS: 25.0000 mg | ORAL_TABLET | Freq: Three times a day (TID) | ORAL | 0 refills | Status: DC | PRN

## 2019-11-06 MED ORDER — PROMETHAZINE HCL 25 MG PO TABS: 25.0000 mg | ORAL_TABLET | Freq: Four times a day (QID) | ORAL | 0 refills | Status: DC | PRN

## 2019-11-06 MED ORDER — SULFAMETHOXAZOLE-TRIMETHOPRIM 800-160 MG PO TABS: 1.0000 | ORAL_TABLET | Freq: Two times a day (BID) | ORAL | 0 refills | Status: AC

## 2019-11-06 MED ORDER — LEVETIRACETAM 750 MG PO TABS: 750.0000 mg | ORAL_TABLET | Freq: Two times a day (BID) | ORAL | 0 refills | Status: DC

## 2019-11-06 MED ORDER — TRAZODONE HCL 50 MG PO TABS: 50.0000 mg | ORAL_TABLET | Freq: Every evening | ORAL | 0 refills | Status: DC | PRN

## 2019-11-06 NOTE — Plan of Care (Signed)
°  Problem: Education: °Goal: Utilization of techniques to improve thought processes will improve °Outcome: Adequate for Discharge °Goal: Knowledge of the prescribed therapeutic regimen will improve °Outcome: Adequate for Discharge °  °Problem: Activity: °Goal: Interest or engagement in leisure activities will improve °Outcome: Adequate for Discharge °Goal: Imbalance in normal sleep/wake cycle will improve °Outcome: Adequate for Discharge °  °Problem: Coping: °Goal: Coping ability will improve °Outcome: Adequate for Discharge °Goal: Will verbalize feelings °Outcome: Adequate for Discharge °  °Problem: Health Behavior/Discharge Planning: °Goal: Ability to make decisions will improve °Outcome: Adequate for Discharge °Goal: Compliance with therapeutic regimen will improve °Outcome: Adequate for Discharge °  °Problem: Role Relationship: °Goal: Will demonstrate positive changes in social behaviors and relationships °Outcome: Adequate for Discharge °  °Problem: Safety: °Goal: Ability to disclose and discuss suicidal ideas will improve °Outcome: Adequate for Discharge °Goal: Ability to identify and utilize support systems that promote safety will improve °Outcome: Adequate for Discharge °  °Problem: Self-Concept: °Goal: Will verbalize positive feelings about self °Outcome: Adequate for Discharge °Goal: Level of anxiety will decrease °Outcome: Adequate for Discharge °  °Problem: Education: °Goal: Knowledge of [place] [facility] Education information/materials will improve °Outcome: Adequate for Discharge °Goal: Emotional status will improve °Outcome: Adequate for Discharge °Goal: Mental status will improve °Outcome: Adequate for Discharge °Goal: Verbalization of understanding the information provided will improve °Outcome: Adequate for Discharge °  °Problem: Activity: °Goal: Interest or engagement in activities will improve °Outcome: Adequate for Discharge °Goal: Sleeping patterns will improve °Outcome: Adequate for  Discharge °  °Problem: Coping: °Goal: Ability to verbalize frustrations and anger appropriately will improve °Outcome: Adequate for Discharge °Goal: Ability to demonstrate self-control will improve °Outcome: Adequate for Discharge °  °Problem: Health Behavior/Discharge Planning: °Goal: Identification of resources available to assist in meeting health care needs will improve °Outcome: Adequate for Discharge °Goal: Compliance with treatment plan for underlying cause of condition will improve °Outcome: Adequate for Discharge °  °Problem: Physical Regulation: °Goal: Ability to maintain clinical measurements within normal limits will improve °Outcome: Adequate for Discharge °  °Problem: Safety: °Goal: Periods of time without injury will increase °Outcome: Adequate for Discharge °  °

## 2019-11-06 NOTE — [facility] Suicide Risk Assessment (Signed)
[facility] Admission Suicide Risk Assessment   Nursing information obtained from:    Demographic factors:    Current Mental Status:    Loss Factors:    Historical Factors:    Risk Reduction Factors:     Total Time spent with patient: 1 hour Principal Problem: Overdose of sedative or hypnotic Diagnosis:  Principal Problem:   Overdose of sedative or hypnotic Active Problems:   GAD (generalized anxiety disorder)   Seizure (HCC)  Subjective Data: Patient seen and chart reviewed.  29 year old woman with a history of anxiety and epilepsy presented to the hospital in [place] after having what is presumed to have been a seizure.  History suggested that she had overdosed on over-the-counter sleeping medicine.  Once medically stabilized she was sent to [place].  Patient absolutely denies any suicidal thought plan or intent.  Denies that she had been depressed.  Denies that there was anything suicidal or self-injurious about her overdose of the medicine but that she was simply trying to sleep.  Patient is requesting discharge home.  Continued Clinical Symptoms:    The "Alcohol Use Disorders Identification Test", Guidelines for Use in Primary Care, Second Edition.  World Pharmacologist [facility]). Score between 0-7:  no or low risk or alcohol related problems. Score between 8-15:  moderate risk of alcohol related problems. Score between 16-19:  high risk of alcohol related problems. Score 20 or above:  warrants further diagnostic evaluation for alcohol dependence and treatment.   CLINICAL FACTORS:   Severe Anxiety and/or Agitation   Musculoskeletal: Strength & Muscle Tone: within normal limits Gait & Station: normal Patient leans: N/A  Psychiatric Specialty Exam: Physical Exam  Nursing note and vitals reviewed. Constitutional: She appears well-developed and well-nourished.  HENT:  Head: Normocephalic and atraumatic.  Eyes: Pupils are equal, round, and reactive to light. Conjunctivae are  normal.  Cardiovascular: Regular rhythm and normal heart sounds.  Respiratory: Effort normal. No respiratory distress.  GI: Soft.  Musculoskeletal:        General: Normal range of motion.     Cervical back: Normal range of motion.  Neurological: She is alert.  Skin: Skin is warm and dry.  Psychiatric: Her affect is blunt. Her speech is delayed. She is slowed. Thought content is not paranoid. Cognition and memory are impaired. She expresses inappropriate judgment. She expresses no homicidal and no suicidal ideation. She exhibits abnormal recent memory.    Review of Systems  Constitutional: Negative.   HENT: Negative.   Eyes: Negative.   Respiratory: Negative.   Cardiovascular: Negative.   Gastrointestinal: Negative.   Musculoskeletal: Negative.   Skin: Negative.   Neurological: Positive for seizures.  Psychiatric/Behavioral: Positive for sleep disturbance. The patient is nervous/anxious.     There were no vitals taken for this visit.There is no height or weight on file to calculate BMI.  General Appearance: Casual  Eye Contact:  Fair  Speech:  Slow  Volume:  Decreased  Mood:  Anxious and Dysphoric  Affect:  Congruent and Tearful  Thought Process:  Coherent  Orientation:  Full (Time, Place, and Person)  Thought Content:  Rumination and Tangential  Suicidal Thoughts:  No  Homicidal Thoughts:  No  Memory:  Immediate;   Fair Recent;   Poor Remote;   Fair  Judgement:  Impaired  Insight:  Shallow  Psychomotor Activity:  Decreased  Concentration:  Concentration: Fair  Recall:  [employer] of Knowledge:  Fair  Language:  Fair  Akathisia:  No  Handed:  Right  AIMS (if indicated):     Assets:  Desire for Improvement Housing Resilience Social Support  ADL's:  Intact  Cognition:  Impaired,  Mild  Sleep:  Number of Hours: 8.25      COGNITIVE FEATURES THAT CONTRIBUTE TO RISK:  Loss of executive function    SUICIDE RISK:   Minimal: No identifiable suicidal ideation.   Patients presenting with no risk factors but with morbid ruminations; may be classified as minimal risk based on the severity of the depressive symptoms  PLAN OF CARE: Spoke with patient's husband.  Confirmed that there is no acute safety risk.  Patient will be discharged home with plans that she follow-up with [facility].  She will be instructed to not take medicines other than as they are specifically prescribed.  I certify that inpatient services furnished can reasonably be expected to improve the patient's condition.   Mordecai Rasmussen, MD 11/06/2019, 11:02 AM

## 2019-11-06 NOTE — BHH Suicide Risk Assessment (Signed)
[facility] Discharge Suicide Risk Assessment   Principal Problem: Overdose of sedative or hypnotic Discharge Diagnoses: Principal Problem:   Overdose of sedative or hypnotic Active Problems:   GAD (generalized anxiety disorder)   Seizure (HCC)   Total Time spent with patient: 1 hour  Musculoskeletal: Strength & Muscle Tone: within normal limits Gait & Station: normal Patient leans: N/A  Psychiatric Specialty Exam: Review of Systems  Constitutional: Negative.   HENT: Negative.   Eyes: Negative.   Respiratory: Negative.   Cardiovascular: Negative.   Gastrointestinal: Negative.   Musculoskeletal: Negative.   Skin: Negative.   Neurological: Negative.   Psychiatric/Behavioral: Positive for sleep disturbance. The patient is nervous/anxious.     Height 5' (1.524 m), weight 93.4 kg.Body mass index is 40.23 kg/m.  General Appearance: Casual  Eye Contact::  Good  Speech:  Slow409  Volume:  Decreased  Mood:  Anxious  Affect:  Congruent  Thought Process:  Coherent  Orientation:  Full (Time, Place, and Person)  Thought Content:  Rumination and Tangential  Suicidal Thoughts:  No  Homicidal Thoughts:  No  Memory:  Immediate;   Fair Recent;   Poor Remote;   Fair  Judgement:  Fair  Insight:  Shallow  Psychomotor Activity:  Normal  Concentration:  Fair  Recall:  Poor  Fund of Knowledge:Fair  Language: Fair  Akathisia:  No  Handed:  Right  AIMS (if indicated):     Assets:  Desire for Improvement Housing Resilience Social Support  Sleep:  Number of Hours: 8.25  Cognition: Impaired,  Mild  ADL's:  Impaired   Mental Status Per Nursing Assessment::   On Admission:  NA  Demographic Factors:  Unemployed  Loss Factors: NA  Historical Factors: Impulsivity  Risk Reduction Factors:   Sense of responsibility to family, Religious beliefs about death, Living with another person, especially a relative, Positive social support and Positive therapeutic relationship  Continued  Clinical Symptoms:  Severe Anxiety and/or Agitation  Cognitive Features That Contribute To Risk:  None    Suicide Risk:  Minimal: No identifiable suicidal ideation.  Patients presenting with no risk factors but with morbid ruminations; may be classified as minimal risk based on the severity of the depressive symptoms    Plan Of Care/Follow-up recommendations:  Activity:  Activity as tolerated Diet:  Regular diet Other:  Follow-up with outpatient treatment at [facility], MD 11/06/2019, 11:22 AM

## 2019-11-06 NOTE — BHH Group Notes (Signed)
LCSW Group Therapy Note  11/06/2019 3:04 PM  Type of Therapy/Topic:  Group Therapy:  Feelings about Diagnosis  Participation Level:  Minimal   Description of Group:   This group will allow patients to explore their thoughts and feelings about diagnoses they have received. Patients will be guided to explore their level of understanding and acceptance of these diagnoses. Facilitator will encourage patients to process their thoughts and feelings about the reactions of others to their diagnosis and will guide patients in identifying ways to discuss their diagnosis with significant others in their lives. This group will be process-oriented, with patients participating in exploration of their own experiences, giving and receiving support, and processing challenge from other group members.   Therapeutic Goals: 1. Patient will demonstrate understanding of diagnosis as evidenced by identifying two or more symptoms of the disorder 2. Patient will be able to express two feelings regarding the diagnosis 3. Patient will demonstrate their ability to communicate their needs through discussion and/or role play  Summary of Patient Progress: Patient was present in group.  Patient was an active participant.  Patient shared how she has used her husband as a great support system. She reports that he helps her to calm down and relax.    Therapeutic Modalities:   Cognitive Behavioral Therapy Brief Therapy Feelings Identification   Penni Homans, MSW, LCSW 11/06/2019 3:04 PM

## 2019-11-06 NOTE — H&P (Signed)
Psychiatric Admission Assessment Adult  Patient Identification: Jacqueline Jones MRN:  [id number] Date of Evaluation:  11/06/2019 Chief Complaint:  MDD (major depressive disorder), severe (HCC) [F32.2] Principal Diagnosis: Overdose of sedative or hypnotic Diagnosis:  Principal Problem:   Overdose of sedative or hypnotic Active Problems:   GAD (generalized anxiety disorder)   Seizure (HCC)  History of Present Illness: Patient seen chart reviewed.  This is a 29 year old woman with a history of anxiety who came to the hospital in [place] with altered mental status and a history suggesting a likely seizure.  Husband reported that the patient had recently been given bottles of over-the-counter sleeping medicine and there was reason to believe she had taken an excessive amount of it.  On interview today the patient does not deny having taken quite a bit of the sleeping medicine but absolutely denies that there was any suicidal intent.  He says she has chronic difficulty sleeping and has not been able to get a good night's rest.  She says that she took the sleeping pills just because she wanted to sleep.  She has no memory of the seizure but says that his typical as she does not normally get any prodromal warning for her seizures.  Patient says that prior to presenting to the hospital her mood recently had been fine.  She denies that she had been feeling sad or depressed.  She does have chronic anxiety problems and chronic trouble sleeping but had been following up with [facility].  She says that she had been compliant with all of her prescription medicine although she cannot remember the names of any of them except for her seizure medicine.  Patient denies any psychotic symptoms.  Denies homicidal ideation.  She states that she very much wants to be discharged today.  Patient reports that she just moved back to [place] from [place] a few months ago.  She was a little confused about whether she had  active psychiatric treatment but her husband tells me that she has been seen at [facility].  He reports that she is currently being prescribed Risperdal.  Patient denies any hallucinations does not report any delusional thinking or other psychotic symptoms.  She denies that she has been using alcohol or any street drugs although when I asked her specifically about marijuana she admits that she uses it a couple times a day to help with her anxiety. Associated Signs/Symptoms: Depression Symptoms:  difficulty concentrating, impaired memory, anxiety, (Hypo) Manic Symptoms:  None reported Anxiety Symptoms:  Excessive Worry, Psychotic Symptoms:  None reported PTSD Symptoms: Patient reports no current trauma.  Looking back through her old notes it does not look like trauma had been considered a significant part of her anxiety in the past Total Time spent with patient: 1 hour  Past Psychiatric History: Patient is not a very reliable historian and has some memory problems making her contribution to the history unreliable.  Going back through the old notes we know that she has been receiving intermittent psychiatric treatment for anxiety and mood since at least age 41 if not younger.  Old notes suggest that she had overdoses and suicide attempts back about 10 years ago but the patient denies it.  Past history of substance abuse is documented and the patient says that she used to abuse Xanax but claims to not be taking any illicit drugs or nonprescribed drugs other than the marijuana that she is using.  Is the patient at risk to self? No.  Has the  patient been a risk to self in the past 6 months? Yes.    Has the patient been a risk to self within the distant past? Yes.    Is the patient a risk to others? No.  Has the patient been a risk to others in the past 6 months? No.  Has the patient been a risk to others within the distant past? No.   Prior Inpatient Therapy:   Prior Outpatient Therapy:    Alcohol  Screening:   Substance Abuse History in the last 12 months:  Yes.   Consequences of Substance Abuse: Not entirely clear I think whether the marijuana use currently is an active problem or not. Previous Psychotropic Medications: Yes  Psychological Evaluations: Yes  Past Medical History:  Past Medical History:  Diagnosis Date  . Anxiety   . Anxiety   . Asthma   . Depression   . Depression   . Frequent headaches   . Infection    UTI  . Pneumonia   . Seizures ([place])    unknown cause- started 08/14  "monthly"  . Sepsis ([place])   . UTI (lower urinary tract infection)    History reviewed. No pertinent surgical history. Family History:  Family History  Problem Relation Age of Onset  . Drug abuse Father   . Drug abuse Sister   . Drug abuse Brother    Family Psychiatric  History: Positive for substance abuse in several family members Tobacco Screening:   Social History:  Social History   Substance and Sexual Activity  Alcohol Use No     Social History   Substance and Sexual Activity  Drug Use Not Currently  . Frequency: 7.0 times per week    Additional Social History:                           Allergies:   Allergies  Allergen Reactions  . Toradol [Ketorolac Tromethamine] Itching    seizure  . Flexeril [Cyclobenzaprine] Other (See Comments)    seizure  . Gabapentin Other (See Comments)    seizures   . Tramadol Itching    Seizures  . Vicodin [Hydrocodone-Acetaminophen] Itching    Pt states she can take Tylenol without difficulty.  . Adhesive [Tape] Rash  . Keflet [Cephalexin] Rash    Did it involve swelling of the face/tongue/throat, SOB, or low BP? No Did it involve sudden or severe rash/hives, skin peeling, or any reaction on the inside of your mouth or nose? No Did you need to seek medical attention at a hospital or doctor's office? No When did it last happen? If all above answers are "NO", may proceed with cephalosporin use.   . Latex Rash   . Levaquin [Levofloxacin In D5w] Rash   Lab Results:  Results for orders placed or performed during the hospital encounter of 11/03/19 (from the past 48 hour(s))  Basic metabolic panel     Status: Abnormal   Collection Time: 11/05/19  5:32 AM  Result Value Ref Range   Sodium 140 135 - 145 mmol/L   Potassium 3.3 (L) 3.5 - 5.1 mmol/L   Chloride 101 98 - 111 mmol/L   CO2 26 22 - 32 mmol/L   Glucose, Bld 86 70 - 99 mg/dL    Comment: Glucose reference range applies only to samples taken after fasting for at least 8 hours.   BUN 8 6 - 20 mg/dL   Creatinine, Ser 0.75 0.44 - 1.00 mg/dL  Calcium 8.5 (L) 8.9 - 10.3 mg/dL   GFR calc non Af Amer >60 >60 mL/min   GFR calc Af Amer >60 >60 mL/min   Anion gap 13 5 - 15    Comment: Performed at [facility], [address] [place], [place] [address]  Magnesium     Status: None   Collection Time: 11/05/19  5:32 AM  Result Value Ref Range   Magnesium 1.9 1.7 - 2.4 mg/dL    Comment: Performed at [facility], [address] [place], [place] [address]    Blood Alcohol level:  Lab Results  Component Value Date   [facility] <10 11/03/2019   ETH <11 06/13/2013    Metabolic Disorder Labs:  Lab Results  Component Value Date   HGBA1C 5.6 11/04/2019   MPG 114 11/04/2019   MPG 100 11/03/2011   Lab Results  Component Value Date   PROLACTIN 8.2 06/13/2013   Lab Results  Component Value Date   CHOL 180 11/03/2011   TRIG 96 11/03/2011   HDL 49 11/03/2011   CHOLHDL 3.7 11/03/2011   VLDL 19 11/03/2011   LDLCALC 112 (H) 11/03/2011    Current Medications: Current Facility-Administered Medications  Medication Dose Route Frequency Provider Last Rate Last Admin  . albuterol (PROVENTIL) (2.5 MG/3ML) 0.083% nebulizer solution 2.5 mg  2.5 mg Nebulization Q6H PRN Money, Gerlene Burdockravis B, FNP      . alum & mag hydroxide-simeth (MAALOX/MYLANTA) 200-200-20 MG/5ML suspension 30 mL  30 mL Oral Q4H PRN Money, Gerlene Burdockravis B, FNP       . hydrOXYzine (ATARAX/VISTARIL) tablet 25 mg  25 mg Oral TID PRN Money, Gerlene Burdockravis B, FNP   25 mg at 11/06/19 0814  . lamoTRIgine (LAMICTAL) tablet 25 mg  25 mg Oral BID Money, Gerlene Burdockravis B, FNP   25 mg at 11/06/19 0815  . levETIRAcetam (KEPPRA) tablet 750 mg  750 mg Oral BID Money, Gerlene Burdockravis B, FNP   750 mg at 11/06/19 0814  . magnesium hydroxide (MILK OF MAGNESIA) suspension 30 mL  30 mL Oral Daily PRN Money, Feliz Beamravis B, FNP      . sulfamethoxazole-trimethoprim (BACTRIM DS) 800-160 MG per tablet 1 tablet  1 tablet Oral Q12H Money, Gerlene Burdockravis B, FNP   1 tablet at 11/06/19 0815  . traZODone (DESYREL) tablet 50 mg  50 mg Oral QHS PRN Money, Gerlene Burdockravis B, FNP   50 mg at 11/05/19 2048   PTA Medications: Medications Prior to Admission  Medication Sig Dispense Refill Last Dose  . [DISCONTINUED] albuterol (PROVENTIL HFA;VENTOLIN HFA) 108 (90 BASE) MCG/ACT inhaler Inhale 2 puffs into the lungs every 6 (six) hours as needed for wheezing or shortness of breath.      . [DISCONTINUED] lamoTRIgine (LAMICTAL) 25 MG tablet Take 25 mg by mouth 2 (two) times daily.     . [DISCONTINUED] levETIRAcetam (KEPPRA) 750 MG tablet Take 1 tablet (750 mg total) by mouth 2 (two) times daily.     . [DISCONTINUED] promethazine (PHENERGAN) 25 MG tablet Take 1 tablet (25 mg total) by mouth every 6 (six) hours as needed for nausea or vomiting. 8 tablet 0   . [DISCONTINUED] sulfamethoxazole-trimethoprim (BACTRIM DS) 800-160 MG tablet Take 1 tablet by mouth every 12 (twelve) hours for 3 days.       Musculoskeletal: Strength & Muscle Tone: within normal limits Gait & Station: normal Patient leans: N/A  Psychiatric Specialty Exam: Physical Exam  Nursing note and vitals reviewed. Constitutional: She appears well-developed and well-nourished.  HENT:  Head: Normocephalic and atraumatic.  Eyes: Pupils are equal, round, and reactive to light. Conjunctivae are normal.  Cardiovascular: Regular rhythm and normal heart sounds.  Respiratory: Effort  normal. No respiratory distress.  GI: Soft.  Musculoskeletal:        General: Normal range of motion.     Cervical back: Normal range of motion.  Neurological: She is alert.  Skin: Skin is warm and dry.  Psychiatric: Her affect is blunt. Her speech is delayed. She is slowed. Thought content is not paranoid. She expresses impulsivity. She does not express inappropriate judgment. She expresses no homicidal and no suicidal ideation. She exhibits abnormal recent memory.    Review of Systems  Constitutional: Negative.   HENT: Negative.   Eyes: Negative.   Respiratory: Negative.   Cardiovascular: Negative.   Gastrointestinal: Negative.   Musculoskeletal: Negative.   Skin: Negative.   Neurological: Positive for seizures.  Psychiatric/Behavioral: Positive for sleep disturbance. The patient is nervous/anxious.     There were no vitals taken for this visit.There is no height or weight on file to calculate BMI.  General Appearance: Casual  Eye Contact:  Minimal  Speech:  Slow  Volume:  Decreased  Mood:  Anxious  Affect:  Congruent and Tearful  Thought Process:  Coherent  Orientation:  Full (Time, Place, and Person)  Thought Content:  Rumination and Tangential  Suicidal Thoughts:  No  Homicidal Thoughts:  No  Memory:  Immediate;   Fair Recent;   Poor Remote;   Fair  Judgement:  Impaired  Insight:  Shallow  Psychomotor Activity:  Decreased  Concentration:  Concentration: Fair  Recall:  [employer] of Knowledge:  Fair  Language:  Fair  Akathisia:  No  Handed:  Right  AIMS (if indicated):     Assets:  Desire for Improvement Housing Social Support  ADL's:  Impaired  Cognition:  Impaired,  Mild  Sleep:  Number of Hours: 8.25    Treatment Plan Summary: Plan 29 year old woman with epilepsy and a history of anxiety.  Patient absolutely denies any suicidal thoughts intent or plan.  Patient denies that she was in any way trying to harm her self taking sleeping medicine.  She is  currently being followed up with [facility].  Patient is begging for discharge.  I spoke with her husband who agrees that there is not an acute safety issue and is agreeable to discharge planning.  Patient will be given a prescription for the few days of Septra to complete her treatment of urinary tract infection.  Otherwise she is to stay on her current outpatient medicine and follow-up with [facility].  Observation Level/Precautions:  15 minute checks  Laboratory:  Chemistry Profile  Psychotherapy:    Medications:    Consultations:    Discharge Concerns:    Estimated LOS:  Other:     Physician Treatment Plan for Primary Diagnosis: Overdose of sedative or hypnotic Long Term Goal(s): Improvement in symptoms so as ready for discharge  Short Term Goals: Ability to verbalize feelings will improve and Ability to demonstrate self-control will improve  Physician Treatment Plan for Secondary Diagnosis: Principal Problem:   Overdose of sedative or hypnotic Active Problems:   GAD (generalized anxiety disorder)   Seizure (HCC)  Long Term Goal(s): Improvement in symptoms so as ready for discharge  Short Term Goals: Ability to maintain clinical measurements within normal limits will improve and Compliance with prescribed medications will improve  I certify that inpatient services furnished can reasonably be expected to improve  the patient's condition.    Mordecai Rasmussen, MD 4/13/202111:07 AM

## 2019-11-06 NOTE — Progress Notes (Signed)
Recreation Therapy Notes  Date: 11/06/2019  Time: 9:30 am   Location: Craft room   Behavioral response: N/A   Intervention Topic: Strengths    Discussion/Intervention: Patient did not attend group.   Clinical Observations/Feedback:  Patient did not attend group.   Merri Dimaano LRT/CTRS        Gia Lusher 11/06/2019 10:42 AM

## 2019-11-06 NOTE — Progress Notes (Signed)
Pt denies SI, HI and AVH. Pt received belongings, prescriptions and transition packet. Pt was educated on dc plan and verbalizes uderstanding. Torrie Mayers RN

## 2019-11-06 NOTE — Progress Notes (Signed)
  [facility] Adult Case Management Discharge Plan :  Will you be returning to the same living situation after discharge:  Yes,  pt is returning home.  At discharge, do you have transportation home?: Yes,  husband will provide transportation.  Do you have the ability to pay for your medications: Yes,  [facility].  Release of information consent forms completed and in the chart;  Patient's signature needed at discharge.  Patient to Follow up at: Follow-up Information    [facility] Follow up.   Why: CSW has reached out to [facility] to schedule aftercare plans, should you be discharge prior to appointments being scheduled please follow up with your normal providers.  Thanks!  Contact information: [address] [place] [address] [phone]           Next level of care provider has access to [facility] Link:no  Safety Planning and Suicide Prevention discussed: Yes,  SPE completed with pt.     Has patient been referred to the Quitline?: Patient refused referral  Patient has been referred for addiction treatment: N/A  [name], LCSW 11/06/2019, 2:06 PM

## 2019-11-06 NOTE — BHH Counselor (Signed)
Adult Comprehensive Assessment  Patient ID: Jacqueline Jones, female   DOB: 11/03/1990, 29 y.o.   MRN: [id number]  Information Source:    Current Stressors:     Living/Environment/Situation:  Living Arrangements: Spouse/significant other  Family History:     Childhood History:     Education:     Employment/Work Situation:      Surveyor, quantity Resources:      Alcohol/Substance Abuse:      Social Support System:      Leisure/Recreation:      Strengths/Needs:      Discharge Plan:      Summary/Recommendations:   Emergency planning/management officer and Recommendations (to be completed by the evaluator): Patient is a 29 year old married female [place] [facility]  She presents to the hospital following reported intentional overdose of OTC medications and a sleep aide.  She currently denies that this was intentional, though initial reports state that it was.  She has a primary diagnosis of Major Depressive Disorder.  Recommendations include: crisis stabilization, therapeutic milieu, encourage group attendance and participation, medication management for detox/mood stabilization and development of comprehensive mental wellness/sobriety plan.  [name]. 11/06/2019

## 2019-11-06 NOTE — Discharge Summary (Signed)
Physician Discharge Summary Note  Patient:  Jacqueline Jones is an 29 y.o., female MRN:  [id number] DOB:  01-Jun-1991 Patient phone:  [phone] (home)  Patient address:   [address] [place] [address],  Total Time spent with patient: 1 hour  Date of Admission:  11/05/2019 Date of Discharge: November 06, 2019  Reason for Admission: Patient was admitted to the hospital in transfer after being stabilized for overdose of over-the-counter hypnotics.  Patient had a likely seizure and appears to have taken excessive amounts of over-the-counter sleeping pills.  She is now denying any suicidal ideation or intent at all.  Principal Problem: Overdose of sedative or hypnotic Discharge Diagnoses: Principal Problem:   Overdose of sedative or hypnotic Active Problems:   GAD (generalized anxiety disorder)   Seizure (HCC)   Past Psychiatric History: Patient has a long history of chronic mental health problems with anxiety most prominent as a symptom.  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  History of seizure disorder.  Past Medical History:  Past Medical History:  Diagnosis Date  . Anxiety   . Anxiety   . Asthma   . Depression   . Depression   . Frequent headaches   . Infection    UTI  . Pneumonia   . Seizures (HCC)    unknown cause- started 08/14  "monthly"  . Sepsis (HCC)   . UTI (lower urinary tract infection)    History reviewed. No pertinent surgical history. Family History:  Family History  Problem Relation Age of Onset  . Drug abuse Father   . Drug abuse Sister   . Drug abuse Brother    Family Psychiatric  History: Denies any Social History:  Social History   Substance and Sexual Activity  Alcohol Use No     Social History   Substance and Sexual Activity  Drug Use Not Currently  . Frequency: 7.0 times per week    Social History   Socioeconomic History  . Marital status: Married    Spouse name: Not on file  . Number of children: Not on file  .  Years of education: Not on file  . Highest education level: Not on file  Occupational History  . Not on file  Tobacco Use  . Smoking status: Former Smoker    Packs/day: 0.25    Years: 10.00    Pack years: 2.50    Types: Cigarettes  . Smokeless tobacco: Former Neurosurgeon  . Tobacco comment: she is wanting to quit trying  Substance and Sexual Activity  . Alcohol use: No  . Drug use: Not Currently    Frequency: 7.0 times per week  . Sexual activity: Yes    Birth control/protection: None  Other Topics Concern  . Not on file  Social History Narrative   ** Merged History Encounter **       Social Determinants of Health   Financial Resource Strain:   . Difficulty of Paying Living Expenses:   Food Insecurity:   . Worried About Programme researcher, broadcasting/film/video in the Last Year:   . Barista in the Last Year:   Transportation Needs:   . Freight forwarder (Medical):   Marland Kitchen Lack of Transportation (Non-Medical):   Physical Activity:   . Days of Exercise per Week:   . Minutes of Exercise per Session:   Stress:   . Feeling of Stress :   Social Connections:   . Frequency of Communication with Friends and Family:   .  Frequency of Social Gatherings with Friends and Family:   . Attends Religious Services:   . Active Member of Clubs or Organizations:   . Attends Banker Meetings:   Marland Kitchen Marital Status:     Hospital Course: Admitted to the psychiatric unit.  15-minute checks maintained.  Patient did not show any dangerous behavior in the hospital.  She was cooperative with treatment planning.  Patient has consistently denied any suicidal ideation.  Denies any current psychotic symptoms.  Patient has been educated about the dangers of overusage of medication.  I have spoken with her husband who confirms no current safety concerns and is agreeable to her returning home and continuing follow-up at [facility].  Physical Findings: AIMS:  , ,  ,  ,    CIWA:    COWS:      Musculoskeletal: Strength & Muscle Tone: within normal limits Gait & Station: normal Patient leans: N/A  Psychiatric Specialty Exam: Physical Exam  Nursing note and vitals reviewed. Constitutional: She appears well-developed and well-nourished.  HENT:  Head: Normocephalic and atraumatic.  Eyes: Pupils are equal, round, and reactive to light. Conjunctivae are normal.  Cardiovascular: Regular rhythm and normal heart sounds.  Respiratory: Effort normal.  GI: Soft.  Musculoskeletal:        General: Normal range of motion.     Cervical back: Normal range of motion.  Neurological: She is alert.  Skin: Skin is warm and dry.  Psychiatric: Judgment normal. Her mood appears anxious. Her speech is delayed. She is slowed. Thought content is not paranoid. She expresses no homicidal and no suicidal ideation. She exhibits abnormal recent memory.    Review of Systems  Constitutional: Negative.   HENT: Negative.   Eyes: Negative.   Respiratory: Negative.   Cardiovascular: Negative.   Gastrointestinal: Negative.   Musculoskeletal: Negative.   Skin: Negative.   Neurological: Negative.   Psychiatric/Behavioral: Positive for sleep disturbance. The patient is nervous/anxious.     Height 5' (1.524 m), weight 93.4 kg.Body mass index is 40.23 kg/m.  General Appearance: Casual  Eye Contact:  Minimal  Speech:  Slow  Volume:  Decreased  Mood:  Dysphoric  Affect:  Tearful  Thought Process:  Coherent  Orientation:  Full (Time, Place, and Person)  Thought Content:  Logical  Suicidal Thoughts:  No  Homicidal Thoughts:  No  Memory:  Immediate;   Fair Recent;   Fair Remote;   Fair  Judgement:  Fair  Insight:  Shallow  Psychomotor Activity:  Decreased  Concentration:  Concentration: Fair  Recall:  Fair  Fund of Knowledge:  Fair  Language:  Fair  Akathisia:  No  Handed:  Right  AIMS (if indicated):     Assets:  Desire for Improvement Housing Resilience Social Support  ADL's:  Impaired   Cognition:  Impaired,  Mild  Sleep:  Number of Hours: 8.25        Has this patient used any form of tobacco in the last 30 days? (Cigarettes, Smokeless Tobacco, Cigars, and/or Pipes) Yes, No  Blood Alcohol level:  Lab Results  Component Value Date   [facility] <10 11/03/2019   ETH <11 06/13/2013    Metabolic Disorder Labs:  Lab Results  Component Value Date   HGBA1C 5.6 11/04/2019   MPG 114 11/04/2019   MPG 100 11/03/2011   Lab Results  Component Value Date   PROLACTIN 8.2 06/13/2013   Lab Results  Component Value Date   CHOL 180 11/03/2011   TRIG 96 11/03/2011  HDL 49 11/03/2011   CHOLHDL 3.7 11/03/2011   VLDL 19 11/03/2011   LDLCALC 112 (H) 11/03/2011    See Psychiatric Specialty Exam and Suicide Risk Assessment completed by Attending Physician prior to discharge.  Discharge destination:  Home  Is patient on multiple antipsychotic therapies at discharge:  No   Has Patient had three or more failed trials of antipsychotic monotherapy by history:  No  Recommended Plan for Multiple Antipsychotic Therapies: NA  Discharge Instructions    Diet - low sodium heart healthy   Complete by: As directed    Diet - low sodium heart healthy   Complete by: As directed    Increase activity slowly   Complete by: As directed    Increase activity slowly   Complete by: As directed      Allergies as of 11/06/2019      Reactions   Toradol [ketorolac Tromethamine] Itching   seizure   Flexeril [cyclobenzaprine] Other (See Comments)   seizure   Gabapentin Other (See Comments)   seizures   Tramadol Itching   Seizures   Vicodin [hydrocodone-acetaminophen] Itching   Pt states she can take Tylenol without difficulty.   Adhesive [tape] Rash   Keflet [cephalexin] Rash   Did it involve swelling of the face/tongue/throat, SOB, or low BP? No Did it involve sudden or severe rash/hives, skin peeling, or any reaction on the inside of your mouth or nose? No Did you need to seek medical  attention at a hospital or doctor's office? No When did it last happen? If all above answers are "NO", may proceed with cephalosporin use.   Latex Rash   Levaquin [levofloxacin In D5w] Rash      Medication List    TAKE these medications     Indication  albuterol 108 (90 Base) MCG/ACT inhaler Commonly known as: VENTOLIN HFA Inhale 2 puffs into the lungs every 6 (six) hours as needed for wheezing or shortness of breath.  Indication: Asthma   hydrOXYzine 25 MG tablet Commonly known as: ATARAX/VISTARIL Take 1 tablet (25 mg total) by mouth 3 (three) times daily as needed for anxiety.  Indication: Feeling Anxious   lamoTRIgine 25 MG tablet Commonly known as: LAMICTAL Take 1 tablet (25 mg total) by mouth 2 (two) times daily.  Indication: Epilepsy   levETIRAcetam 750 MG tablet Commonly known as: KEPPRA Take 1 tablet (750 mg total) by mouth 2 (two) times daily.  Indication: Tonic-Clonic Seizures   promethazine 25 MG tablet Commonly known as: PHENERGAN Take 1 tablet (25 mg total) by mouth every 6 (six) hours as needed for nausea or vomiting.  Indication: Nausea and Vomiting   sulfamethoxazole-trimethoprim 800-160 MG tablet Commonly known as: BACTRIM DS Take 1 tablet by mouth every 12 (twelve) hours for 3 days.  Indication: Urinary Tract Infection   traZODone 50 MG tablet Commonly known as: DESYREL Take 1 tablet (50 mg total) by mouth at bedtime as needed for sleep.  Indication: Trouble Sleeping      Follow-up Information    [facility] Follow up.   Why: CSW has reached out to [facility] to schedule aftercare plans, should you be discharge prior to appointments being scheduled please follow up with your normal providers.  Thanks!  Contact information: [address] [place] [address] [phone]           Follow-up recommendations:  Activity:  Activity as tolerated Diet:  Regular diet Other:  [facility]  Comments: Continue current medicine as  well as what  ever is being prescribed by [facility].  Continue outpatient treatment with them.  Do not overuse or misuse over-the-counter or prescription medicine.  Continue antibiotic for another 3 days to complete treatment of urinary tract infection  Signed: Mordecai Rasmussen, MD 11/06/2019, 12:41 PM

## 2019-11-06 NOTE — Tx Team (Addendum)
Interdisciplinary Treatment and Diagnostic Plan Update  11/06/2019 Time of Session: 9:00AM Jacqueline Jones MRN: [id number]  Principal Diagnosis: Overdose of sedative or hypnotic  Secondary Diagnoses: Principal Problem:   Overdose of sedative or hypnotic Active Problems:   GAD (generalized anxiety disorder)   Seizure (HCC)   Current Medications:  Current Facility-Administered Medications  Medication Dose Route Frequency Provider Last Rate Last Admin  . albuterol (PROVENTIL) (2.5 MG/3ML) 0.083% nebulizer solution 2.5 mg  2.5 mg Nebulization Q6H PRN Money, Gerlene Burdock, FNP      . alum & mag hydroxide-simeth (MAALOX/MYLANTA) 200-200-20 MG/5ML suspension 30 mL  30 mL Oral Q4H PRN Money, Gerlene Burdock, FNP      . hydrOXYzine (ATARAX/VISTARIL) tablet 25 mg  25 mg Oral TID PRN Money, Gerlene Burdock, FNP   25 mg at 11/06/19 0814  . lamoTRIgine (LAMICTAL) tablet 25 mg  25 mg Oral BID Money, Gerlene Burdock, FNP   25 mg at 11/06/19 0815  . levETIRAcetam (KEPPRA) tablet 750 mg  750 mg Oral BID Money, Gerlene Burdock, FNP   750 mg at 11/06/19 0814  . magnesium hydroxide (MILK OF MAGNESIA) suspension 30 mL  30 mL Oral Daily PRN Money, Feliz Beam B, FNP      . sulfamethoxazole-trimethoprim (BACTRIM DS) 800-160 MG per tablet 1 tablet  1 tablet Oral Q12H Money, Gerlene Burdock, FNP   1 tablet at 11/06/19 0815  . traZODone (DESYREL) tablet 50 mg  50 mg Oral QHS PRN Money, Gerlene Burdock, FNP   50 mg at 11/05/19 2048   PTA Medications: Medications Prior to Admission  Medication Sig Dispense Refill Last Dose  . [DISCONTINUED] albuterol (PROVENTIL HFA;VENTOLIN HFA) 108 (90 BASE) MCG/ACT inhaler Inhale 2 puffs into the lungs every 6 (six) hours as needed for wheezing or shortness of breath.      . [DISCONTINUED] lamoTRIgine (LAMICTAL) 25 MG tablet Take 25 mg by mouth 2 (two) times daily.     . [DISCONTINUED] levETIRAcetam (KEPPRA) 750 MG tablet Take 1 tablet (750 mg total) by mouth 2 (two) times daily.     . [DISCONTINUED] promethazine (PHENERGAN)  25 MG tablet Take 1 tablet (25 mg total) by mouth every 6 (six) hours as needed for nausea or vomiting. 8 tablet 0   . [DISCONTINUED] sulfamethoxazole-trimethoprim (BACTRIM DS) 800-160 MG tablet Take 1 tablet by mouth every 12 (twelve) hours for 3 days.       Patient Stressors:    Patient Strengths:    Treatment Modalities: Medication Management, Group therapy, Case management,  1 to 1 session with clinician, Psychoeducation, Recreational therapy.   Physician Treatment Plan for Primary Diagnosis: Overdose of sedative or hypnotic Long Term Goal(s): Improvement in symptoms so as ready for discharge Improvement in symptoms so as ready for discharge   Short Term Goals: Ability to verbalize feelings will improve Ability to demonstrate self-control will improve Ability to maintain clinical measurements within normal limits will improve Compliance with prescribed medications will improve  Medication Management: Evaluate patient's response, side effects, and tolerance of medication regimen.  Therapeutic Interventions: 1 to 1 sessions, Unit Group sessions and Medication administration.  Evaluation of Outcomes: Adequate for Discharge  Physician Treatment Plan for Secondary Diagnosis: Principal Problem:   Overdose of sedative or hypnotic Active Problems:   GAD (generalized anxiety disorder)   Seizure (HCC)  Long Term Goal(s): Improvement in symptoms so as ready for discharge Improvement in symptoms so as ready for discharge   Short Term Goals: Ability to verbalize feelings will improve Ability to  demonstrate self-control will improve Ability to maintain clinical measurements within normal limits will improve Compliance with prescribed medications will improve     Medication Management: Evaluate patient's response, side effects, and tolerance of medication regimen.  Therapeutic Interventions: 1 to 1 sessions, Unit Group sessions and Medication administration.  Evaluation of Outcomes:  Adequate for Discharge   RN Treatment Plan for Primary Diagnosis: Overdose of sedative or hypnotic Long Term Goal(s): Knowledge of disease and therapeutic regimen to maintain health will improve  Short Term Goals: Ability to remain free from injury will improve, Ability to verbalize frustration and anger appropriately will improve, Ability to demonstrate self-control, Ability to participate in decision making will improve, Ability to verbalize feelings will improve, Ability to disclose and discuss suicidal ideas, Ability to identify and develop effective coping behaviors will improve and Compliance with prescribed medications will improve  Medication Management: RN will administer medications as ordered by provider, will assess and evaluate patient's response and provide education to patient for prescribed medication. RN will report any adverse and/or side effects to prescribing provider.  Therapeutic Interventions: 1 on 1 counseling sessions, Psychoeducation, Medication administration, Evaluate responses to treatment, Monitor vital signs and CBGs as ordered, Perform/monitor CIWA, COWS, AIMS and Fall Risk screenings as ordered, Perform wound care treatments as ordered.  Evaluation of Outcomes: Adequate for Discharge   LCSW Treatment Plan for Primary Diagnosis: Overdose of sedative or hypnotic Long Term Goal(s): Safe transition to appropriate next level of care at discharge, Engage patient in therapeutic group addressing interpersonal concerns.  Short Term Goals: Engage patient in aftercare planning with referrals and resources, Increase social support, Increase ability to appropriately verbalize feelings, Increase emotional regulation, Facilitate acceptance of mental health diagnosis and concerns and Increase skills for wellness and recovery  Therapeutic Interventions: Assess for all discharge needs, 1 to 1 time with Social worker, Explore available resources and support systems, Assess for  adequacy in community support network, Educate family and significant other(s) on suicide prevention, Complete Psychosocial Assessment, Interpersonal group therapy.  Evaluation of Outcomes: Adequate for Discharge   Progress in Treatment: Attending groups: No. Participating in groups: No. Taking medication as prescribed: Yes. Toleration medication: Yes. Family/Significant other contact made: Yes, individual(s) contacted:  SPE completed with pt and pt's husband.  Patient understands diagnosis: Yes. Discussing patient identified problems/goals with staff: Yes. Medical problems stabilized or resolved: Yes. Denies suicidal/homicidal ideation: Yes. Issues/concerns per patient self-inventory: No. Other: none  New problem(s) identified: No, Describe:  none  New Short Term/Long Term Goal(s): medication management for mood stabilization; elimination of SI thoughts; development of comprehensive mental wellness/sobriety plan.  Patient Goals:  "I hope I get to go home"  Discharge Plan or Barriers: Patient plans to follow up with her current providers at [facility].  Patient has both a therapist and a psychiatrist.   Reason for Continuation of Hospitalization: Anxiety Depression Medication stabilization Suicidal ideation  Estimated Length of Stay:  1-7 days  Recreational Therapy: Patient Stressors: N/A Patient Goal: Patient will engage in groups without prompting or encouragement from LRT x3 group sessions within 5 recreation therapy group sessions  Attendees: Patient: Jacqueline Jones 11/06/2019 11:25 AM  Physician: Dr. Weber Cooks, MD 11/06/2019 11:25 AM  Nursing: Lyda Kalata, RN 11/06/2019 11:25 AM  RN Care Manager: 11/06/2019 11:25 AM  Social Worker: Assunta Curtis, LCSW 11/06/2019 11:25 AM  Recreational Therapist: Roanna Epley, CTRS, LRT 11/06/2019 11:25 AM  Other: Sanjuana Kava, LCSW 11/06/2019 11:25 AM  Other:  11/06/2019 11:25 AM  Other: 11/06/2019 11:25 AM  Scribe for  Treatment Team: [name], Alexander Mt 11/06/2019 11:25 AM

## 2019-11-06 NOTE — [facility] Suicide Risk Assessment (Signed)
[facility] INPATIENT:  Family/Significant Other Suicide Prevention Education  Suicide Prevention Education:  Education Completed; Jacqueline Jones, husband, [phone], has been identified by the patient as the family member/significant other with whom the patient will be residing, and identified as the person(s) who will aid the patient in the event of a mental health crisis (suicidal ideations/suicide attempt).  With written consent from the patient, the family member/significant other has been provided the following suicide prevention education, prior to the and/or following the discharge of the patient.  The suicide prevention education provided includes the following:  Suicide risk factors  Suicide prevention and interventions  National Suicide Hotline telephone number  [facility] assessment telephone number  [facility] Emergency Assistance 911  [facility] and/or Residential Mobile Crisis Unit telephone number  Request made of family/significant other to:  Remove weapons (e.g., guns, rifles, knives), all items previously/currently identified as safety concern.    Remove drugs/medications (over-the-counter, prescriptions, illicit drugs), all items previously/currently identified as a safety concern.  The family member/significant other verbalizes understanding of the suicide prevention education information provided.  The family member/significant other agrees to remove the items of safety concern listed above.  Jacqueline Jones 11/06/2019, 11:41 AM

## 2019-11-12 ENCOUNTER — Ambulatory Visit: Payer: Medicare Other | Admitting: Nurse Practitioner

## 2019-11-14 ENCOUNTER — Ambulatory Visit: Payer: Medicaid Other | Admitting: Nurse Practitioner

## 2019-11-22 ENCOUNTER — Ambulatory Visit (INDEPENDENT_AMBULATORY_CARE_PROVIDER_SITE_OTHER)
Admission: RE | Admit: 2019-11-22 | Discharge: 2019-11-22 | Disposition: A | Discharge status: Home / Self Care | Payer: Medicaid Other | Source: Ambulatory Visit | Attending: Nurse Practitioner | Admitting: Nurse Practitioner

## 2019-11-22 ENCOUNTER — Encounter: Payer: Self-pay | Admitting: Nurse Practitioner

## 2019-11-22 ENCOUNTER — Other Ambulatory Visit: Payer: Self-pay

## 2019-11-22 ENCOUNTER — Ambulatory Visit: Payer: Medicaid Other | Admitting: Nurse Practitioner

## 2019-11-22 ENCOUNTER — Other Ambulatory Visit (INDEPENDENT_AMBULATORY_CARE_PROVIDER_SITE_OTHER): Payer: Medicaid Other

## 2019-11-22 VITALS — BP 88/62 | HR 58 | Temp 97.1°F | Ht 60.0 in | Wt 208.0 lb

## 2019-11-22 DIAGNOSIS — K589 Irritable bowel syndrome without diarrhea: Secondary | ICD-10-CM

## 2019-11-22 DIAGNOSIS — R110 Nausea: Secondary | ICD-10-CM

## 2019-11-22 DIAGNOSIS — K760 Fatty (change of) liver, not elsewhere classified: Secondary | ICD-10-CM

## 2019-11-22 LAB — IBC + FERRITIN
Ferritin: 20.8 ng/mL (ref 10.0–291.0)
Iron: 89 ug/dL (ref 42–145)
Saturation Ratios: 18.5 % — ABNORMAL LOW (ref 20.0–50.0)
Transferrin: 344 mg/dL (ref 212.0–360.0)

## 2019-11-22 LAB — IGA: IgA: <10 mg/dL — ABNORMAL LOW (ref 68–378)

## 2019-11-22 MED ORDER — PROMETHAZINE HCL 12.5 MG PO TABS: 12.5000 mg | ORAL_TABLET | Freq: Three times a day (TID) | ORAL | 1 refills | Status: DC | PRN

## 2019-11-22 NOTE — Patient Instructions (Addendum)
If you are age 29 or older, your body mass index should be between 23-30. Your Body mass index is 40.62 kg/m. If this is out of the aforementioned range listed, please consider follow up with your Primary Care Provider.  If you are age 84 or younger, your body mass index should be between 19-25. Your Body mass index is 40.62 kg/m. If this is out of the aformentioned range listed, please consider follow up with your Primary Care Provider.   Your provider has requested that you go to the basement level for lab work before leaving today. Press "B" on the elevator. The lab is located at the first door on the left as you exit the elevator.  Your provider has requested that you have an abdominal x ray before leaving today. Please go to the basement floor to our Radiology department for the test.  We have sent the following medications to your pharmacy for you to pick up at your convenience: Promethazine 12.5 mg every 8 hours as needed for nausea. Do not operate heavy machinery.   Follow up 4-5 weeks.

## 2019-11-22 NOTE — Progress Notes (Signed)
ASSESSMENT / PLAN:   29 year old female with PMH significant for anxiety, depression, headaches, asthma, seizures.  # Chronic nausea and vomiting.  --dates back to childhood. It is cyclic.  --I think EGD would be low yield. Furthermore in setting of recent seizure she isn't a candidate right now.  --She could have gastroparesis, treating empirically with pro-motility agent is an option but I am hesitant to initiate due to some of the potential CNS side effects.  --TCA is also an option for cycle nausea and vomiting --For the time being I will refill Phenergan for her.   # Bowel changes  --Chronic constipation has changed to runny stools after starting Bentyl. This is unusual as Bentyl would be more likely to cause constipation. It doesn't seem that she is having frank diarrhea but should she then will need to check for c-diff  # RLQ pain --started two months ago. Etiology unclear --treated for E.coli UTI earlier this month but no change in the pain.  --bentyl doesn't help.  --Obtain KUB today  # Abnormal liver enzymes.  --Generally 2x the ULN.  --Fatty liver on U/S --Obtain labs to evaluate for other etiologies of chronic liver disease ( viral, genetic, metabolic, etc) --Follow up with me in 3-4 weeks.   # Seizures -Last seizure was 11/03/19 but that one occurred following overdose of Aleve PM and Benadryl.    HPI:     Chief Complaint:  Abdominal pain, fatty liver, nausea, bowel changes   Jacqueline Jones is a 29 yo female, self referred for evaluation of  nausea, bowel changes, and fatty liver. Husband helps with the history. The couple recently relocated from [place]. Last year in [place] they had one visit with Gastroenterology regarding fatty liver disease. Sounds like workup was in progress when the patient moved to [place]. She was told to go on a diet and lose weight.    Patient admitted 11/03/19 with seizures after ingesting 20 pills of Aleve PM and  Benadryl because she couldn't sleep. In the ED her AST was 54, ALT 45. The next day her liver enzymes were normal. Looking back a little further, in early March her AST was 79, ALT. Ultrasound suggests hepatic steatosis. She has a history of heavy etoh use but not Etoh in a few years. No [place] of liver disease.   Patient gives a history of nausea and vomiting dating back to childhood. Symptoms are intermittent and she may goes days or weeks without them. The nausea / vomiting are not meal related, as they can occur on any empty stomach. She hasn't undergone any previous evaluation. She doesn't use Marijuana. She takes Phenergan as Zofran doesn't help.   Mattea has a history of chronic constipation. She was recently prescribed Bentyl for possible IBS. She takes Bentyl QID and now having 3-4 loose BMs a day. She has been having RLQ pain for two months unrelieved with Bentyl or bowel movements. Pain is not positional. No dysuria. No vaginal symptoms. Told in [place] she had a problem with her fallopian tubes but that was on the left side. She was found to have E.coli UTI during recent admission, treated with Bactrim.   Data Reviewed:   11/03/19 through 11/04/19 admission.   WBC 11.7, hemoglobin 12.7  Alkaline phos 112, albumin 4.4, AST 54, ALT 45, total bilirubin 0.8 WBC 23, hemoglobin 15.4, platelets 464  Ultrasound  No gallstones or gallbladder wall thickening.  CBD 4 mm.  Suggestion of hepatic steatosis.   Past Medical History:  Diagnosis Date  . Anemia   . Anxiety   . Anxiety   . Asthma   . Depression   . Depression   . Elevated cholesterol   . Frequent headaches   . High blood pressure   . IBS (irritable bowel syndrome)   . Infection    UTI  . Obesity   . Pneumonia   . Seizures (HCC)    unknown cause- started 08/14  "monthly"  . Sepsis (HCC)   . UTI (lower urinary tract infection)      Past Surgical History:  Procedure Laterality Date  . COLONOSCOPY     age [age]  . DIAGNOSTIC LAPAROSCOPY     Family History  Problem Relation Age of Onset  . Drug abuse Father   . Cancer Father        age [age]  . Lung cancer Father   . Depression Mother   . Anxiety disorder Mother   . Drug abuse Brother        incarcirated   . Colon cancer Neg Hx    Social History   Tobacco Use  . Smoking status: Former Smoker    Packs/day: 0.25    Years: 10.00    Pack years: 2.50    Types: Cigarettes  . Smokeless tobacco: Former Neurosurgeon  . Tobacco comment: she is wanting to quit trying  Substance Use Topics  . Alcohol use: No  . Drug use: Not Currently    Frequency: 7.0 times per week   Current Outpatient Medications  Medication Sig Dispense Refill  . albuterol (VENTOLIN HFA) 108 (90 Base) MCG/ACT inhaler Inhale 2 puffs into the lungs every 6 (six) hours as needed for wheezing or shortness of breath. 6.7 g 0  . lamoTRIgine (LAMICTAL) 25 MG tablet Take 1 tablet (25 mg total) by mouth 2 (two) times daily. 60 tablet 0  . levETIRAcetam (KEPPRA) 750 MG tablet Take 1 tablet (750 mg total) by mouth 2 (two) times daily. 60 tablet 0  . risperiDONE (RISPERDAL PO) Take 1 mg by mouth in the morning and at bedtime.     No current facility-administered medications for this visit.   Allergies  Allergen Reactions  . Toradol [Ketorolac Tromethamine] Itching    seizure  . Flexeril [Cyclobenzaprine] Other (See Comments)    seizure  . Gabapentin Other (See Comments)    seizures   . Tramadol Itching    Seizures  . Vicodin [Hydrocodone-Acetaminophen] Itching    Pt states she can take Tylenol without difficulty.  . Adhesive [Tape] Rash  . Keflet [Cephalexin] Rash    Did it involve swelling of the face/tongue/throat, SOB, or low BP? No Did it involve sudden or severe rash/hives, skin peeling, or any reaction on the inside of your mouth or nose? No Did you need to seek medical attention at a hospital or doctor's office? No When did it last happen? If all  above answers are "NO", may proceed with cephalosporin use.   . Latex Rash  . Levaquin [Levofloxacin In D5w] Rash     Review of Systems: Positive for arthritis, depression, muscle pain, sleeping problems, excessive urination.  All other systems reviewed and negative except where noted in HPI.   Serum creatinine: 0.75 mg/dL 48/88/91 6945 Estimated creatinine clearance: 106.5 mL/min   Physical Exam:    Wt Readings from Last 3 Encounters:  11/22/19 208 lb (94.3 kg)  09/30/19 220 lb (99.8 kg)  05/13/14 170 lb 12.8 oz (77.5 kg)    BP (!) 88/62   Pulse (!) 58   Temp (!) 97.1 F (36.2 C)   Ht 5' (1.524 m)   Wt 208 lb (94.3 kg)   BMI 40.62 kg/m  Constitutional:  Obese female in no acute distress. Psychiatric: Normal mood and affect. Behavior is normal. EENT: Pupils normal.  Conjunctivae are normal. No scleral icterus. Neck supple.  Cardiovascular: Normal rate, regular rhythm. No edema Pulmonary/chest: Effort normal and breath sounds normal. No wheezing, rales or rhonchi. Abdominal: Soft, nondistended, nontender. Bowel sounds active throughout. There are no masses palpable. No hepatomegaly. Neurological: Alert and oriented to person place and time. Skin: Skin is warm and dry. No rashes noted.  Willette Cluster, NP  11/22/2019, 10:12 AM

## 2019-11-23 NOTE — Progress Notes (Signed)
Assessment and plan noted ?

## 2019-11-26 ENCOUNTER — Telehealth: Payer: Self-pay

## 2019-11-28 LAB — HEPATITIS B SURFACE ANTIBODY,QUALITATIVE: Hep B S Ab: BORDERLINE — AB

## 2019-11-28 LAB — MITOCHONDRIAL ANTIBODIES: Mitochondrial M2 Ab, IgG: 20.5 U — ABNORMAL HIGH

## 2019-11-28 LAB — TISSUE TRANSGLUTAMINASE ABS,IGG,IGA
(tTG) Ab, IgA: 1 U/mL
(tTG) Ab, IgG: 2 U/mL

## 2019-11-28 LAB — HEPATITIS A ANTIBODY, TOTAL: Hepatitis A AB,Total: NONREACTIVE

## 2019-11-28 LAB — CERULOPLASMIN: Ceruloplasmin: 53 mg/dL (ref 18–53)

## 2019-11-28 LAB — HEPATITIS C ANTIBODY
Hepatitis C Ab: NONREACTIVE
SIGNAL TO CUT-OFF: 0.01 (ref <1.00)

## 2019-11-28 LAB — ALPHA-1-ANTITRYPSIN: A-1 Antitrypsin, Ser: 248 mg/dL — ABNORMAL HIGH (ref 83–199)

## 2019-11-28 LAB — ANTI-SMOOTH MUSCLE ANTIBODY, IGG: Actin (Smooth Muscle) Antibody (IGG): <20 U (ref <20)

## 2019-11-28 LAB — HEPATITIS B SURFACE ANTIGEN: Hepatitis B Surface Ag: NONREACTIVE

## 2019-11-28 LAB — ANA: Anti Nuclear Antibody (ANA): NEGATIVE

## 2019-12-05 ENCOUNTER — Other Ambulatory Visit: Payer: Self-pay

## 2019-12-05 DIAGNOSIS — R933 Abnormal findings on diagnostic imaging of other parts of digestive tract: Secondary | ICD-10-CM

## 2019-12-05 DIAGNOSIS — R112 Nausea with vomiting, unspecified: Secondary | ICD-10-CM

## 2019-12-11 ENCOUNTER — Ambulatory Visit (HOSPITAL_COMMUNITY)
Admission: RE | Admit: 2019-12-11 | Discharge: 2019-12-11 | Disposition: A | Discharge status: Home / Self Care | Payer: Medicaid Other | Source: Ambulatory Visit | Attending: Nurse Practitioner | Admitting: Nurse Practitioner

## 2019-12-11 ENCOUNTER — Other Ambulatory Visit: Payer: Self-pay

## 2019-12-11 ENCOUNTER — Encounter (HOSPITAL_COMMUNITY): Payer: Self-pay

## 2019-12-11 DIAGNOSIS — R933 Abnormal findings on diagnostic imaging of other parts of digestive tract: Secondary | ICD-10-CM | POA: Insufficient documentation

## 2019-12-11 DIAGNOSIS — R112 Nausea with vomiting, unspecified: Secondary | ICD-10-CM | POA: Diagnosis present

## 2019-12-11 MED ORDER — IOHEXOL 300 MG/ML  SOLN
100.0000 mL | Freq: Once | INTRAMUSCULAR | Status: AC | PRN
  Administered 2019-12-11: 100 mL via INTRAVENOUS

## 2019-12-11 MED ORDER — SODIUM CHLORIDE (PF) 0.9 % IJ SOLN
INTRAMUSCULAR | Status: AC
  Filled 2019-12-11: qty 50

## 2019-12-12 ENCOUNTER — Telehealth: Payer: Self-pay | Admitting: Nurse Practitioner

## 2019-12-12 NOTE — Telephone Encounter (Signed)
Pt inquired about CT results.  

## 2019-12-14 NOTE — Telephone Encounter (Signed)
Pt called again inquiring about imaging results. She states that she is still dealing with sxs.

## 2019-12-16 NOTE — Telephone Encounter (Signed)
Resutls sent to [facility] who will contact patient

## 2019-12-18 ENCOUNTER — Telehealth: Payer: Self-pay

## 2019-12-18 NOTE — Telephone Encounter (Signed)
Patient is advised.  

## 2019-12-18 NOTE — Telephone Encounter (Signed)
Pt called again to inquire about results.

## 2019-12-18 NOTE — Telephone Encounter (Signed)
Spoke with spouse and patient on speaker phone. Understands to follow up with PCP.  Continues to have nausea and vomiting. Only wants to eat yogurt. Spouse is concerned. Appointment for follow up is 01/23/20.

## 2019-12-20 NOTE — Telephone Encounter (Signed)
Contacted patient and the spouse. Moved appointment to 01/03/20 at 2:30pm.

## 2019-12-20 NOTE — Telephone Encounter (Signed)
Jacqueline Jones, I am happy to see her sooner if I have something available.

## 2019-12-25 ENCOUNTER — Telehealth: Payer: Self-pay | Admitting: Nurse Practitioner

## 2019-12-25 NOTE — Telephone Encounter (Signed)
The pt was advised to discuss concerns at the appt on 6/10.  The pt has been advised of the information and verbalized understanding.

## 2019-12-26 NOTE — Telephone Encounter (Signed)
op

## 2019-12-27 ENCOUNTER — Other Ambulatory Visit: Payer: Self-pay | Admitting: Nurse Practitioner

## 2020-01-03 ENCOUNTER — Ambulatory Visit: Payer: Medicaid Other | Admitting: Nurse Practitioner

## 2020-01-03 ENCOUNTER — Encounter: Payer: Self-pay | Admitting: Nurse Practitioner

## 2020-01-03 ENCOUNTER — Other Ambulatory Visit: Payer: Self-pay

## 2020-01-03 VITALS — BP 124/90 | HR 83 | Ht 60.0 in | Wt 201.0 lb

## 2020-01-03 DIAGNOSIS — R112 Nausea with vomiting, unspecified: Secondary | ICD-10-CM | POA: Diagnosis not present

## 2020-01-03 DIAGNOSIS — R1031 Right lower quadrant pain: Secondary | ICD-10-CM

## 2020-01-03 DIAGNOSIS — K5909 Other constipation: Secondary | ICD-10-CM

## 2020-01-03 DIAGNOSIS — K760 Fatty (change of) liver, not elsewhere classified: Secondary | ICD-10-CM | POA: Diagnosis not present

## 2020-01-03 MED ORDER — ONDANSETRON HCL 8 MG PO TABS: 8.0000 mg | ORAL_TABLET | Freq: Three times a day (TID) | ORAL | 3 refills | Status: DC | PRN

## 2020-01-03 MED ORDER — PANTOPRAZOLE SODIUM 40 MG PO TBEC: 40.0000 mg | DELAYED_RELEASE_TABLET | Freq: Every day | ORAL | 3 refills | Status: AC

## 2020-01-03 NOTE — Progress Notes (Signed)
IMPRESSION and PLAN:     # GERD, having pyrosis off meds.  --Couldn't afford to refill pantoprazole over the last month but able to refill it now.    # Progression of chronic nausea / vomiting / weight loss --Weight down from 19 pounds since March 2021.  --Cannot proceed with EGD due to recent seizures ( April and late May). Will obtain upper GI series.to rule out any gross lesions.  --Trial of Zofran 8 mg Q 8 hours prn.  # Chronic constipation with lower abdominal pain.  --Dicylomine prescribed at outside facility hasn't helped so will discontinue it. --Miralax gave her diarrhea in past.  --Trial of Metamucil daily.  --Needs 64 oz water daily.   # Elevated LFTs / hepatic steatosis -- Labs to evaluate competing etiologies for chronic liver disease turned up a couple of findings .  Her AMA was equivocal but ANA negative.  Total IgA was low so tTg IgA may not be accurate   --Smooth muscle antibody apparently not collected.  Will need to obtain at next office follow-up.   # Multiple drug allergies.    # UTI, on antibiotics.   HPI:    Primary GI: Primary GI:   Yancey Flemings, MD    Chief complaint : abdominal pain, nausea / vomiting / weight.loss  Jacqueline Jones and her husband are here for follow up. As described in my 11/22/19 office note patient has chronic nausea / vomiting since childhood. Initially is was sporadic but now occurring on a daily basis over the last few months. Symptoms can be random but also always occur after eating. She vomits within a few minutes of eating.  Unable to even tolerate Pedilyte. Phenergan 25 mg didn't help.  She has been out of reflux medication for a month due to cost but can afford it now.  At her late April visit I ordered a KUB to evaluate some of her GI symptoms.  There was left mild to moderate left colonic distention of unclear etiology.  For further evaluation she had a CT scan of the abdomen and pelvis with contrast on 12/11/2019.  No acute GI  findings.  There was diffuse bladder wall thickening suspicious for cystitis.  There was hydrocolpos and mild hepatic steatosis.  Patient was given the results over the telephone.  CT scan was sent to PCP, patient is now on antibiotics for UTI.  Husband and patient tell me that the fluid in the vagina as seen on CT scan was not new, this was apparently brought to their attention before leaving [place].   Patient inquires about whether she could have a small bowel intussusception like one of the characters in the TV show Mystery Diagnosis   Jacqueline Jones gives a history of IBS. When I saw her late April she was having loose stools after starting Dicyclomine. Still taking Dicyclomine 4 times daily as prescribed by PCP but it has not helped the lower abdominal pain.  She is no longer having loose stool, now back to chronic constipation having one BM a week. Sometimes takes Mg+ citrate.   Patient has epilepsy, last seizure was 12/18/19. Saw Neurology, last visit was last month. She is treated with Lamictal and Keppra.    Review of systems:     No chest pain, no SOB, no fevers, no urinary sx   Past Medical History:  Diagnosis Date  . Anemia   . Anxiety   . Anxiety   . Asthma   .  Depression   . Depression   . Elevated cholesterol   . Frequent headaches   . High blood pressure   . IBS (irritable bowel syndrome)   . Infection    UTI  . Obesity   . Pneumonia   . Seizures ([place])    unknown cause- started 08/14  "monthly"  . Sepsis ([place] [facility])   . UTI (lower urinary tract infection)     Patient's surgical history, family medical history, social history, medications and allergies were all reviewed in Epic   Creatinine clearance cannot be calculated (Patient's most recent lab result is older than the maximum 21 days allowed.)  Current Outpatient Medications  Medication Sig Dispense Refill  . albuterol (VENTOLIN HFA) 108 (90 Base) MCG/ACT inhaler Inhale 2 puffs into the lungs every 6 (six) hours as needed  for wheezing or shortness of breath. 6.7 g 0  . lamoTRIgine (LAMICTAL) 25 MG tablet Take 1 tablet (25 mg total) by mouth 2 (two) times daily. 60 tablet 0  . levETIRAcetam (KEPPRA) 750 MG tablet Take 1 tablet (750 mg total) by mouth 2 (two) times daily. 60 tablet 0  . promethazine (PHENERGAN) 25 MG tablet TAKE 1/2 TABLET BY MOUTH EVERY 8 HOURS AS NEEDED FOR NAUSEA AND VOMITING 20 tablet 0  . risperiDONE (RISPERDAL PO) Take 1 mg by mouth in the morning and at bedtime.     No current facility-administered medications for this visit.    Physical Exam:     BP 124/90   Pulse 83   Ht 5' (1.524 m)   Wt 201 lb (91.2 kg)   LMP 12/03/2019 (Approximate)   SpO2 97%   BMI 39.26 kg/m   GENERAL:  Pleasant female in NAD PSYCH: : Cooperative, normal affect CARDIAC:  RRR PULM: Normal respiratory effort, lungs CTA bilaterally, no wheezing ABDOMEN:  Nondistended, soft, nontender. No obvious masses, no hepatomegaly,  normal bowel sounds SKIN:  turgor, no lesions seen Musculoskeletal:  Normal muscle tone, normal strength NEURO: Alert and oriented x 3, no focal neurologic deficits   Tye Savoy , NP 01/03/2020, 2:54 PM

## 2020-01-03 NOTE — Patient Instructions (Signed)
If you are age 29 or older, your body mass index should be between 23-30. Your Body mass index is 39.26 kg/m. If this is out of the aforementioned range listed, please consider follow up with your Primary Care Provider.  If you are age 8 or younger, your body mass index should be between 19-25. Your Body mass index is 39.26 kg/m. If this is out of the aformentioned range listed, please consider follow up with your Primary Care Provider.   We have sent the following medications to your pharmacy for you to pick up at your convenience: Pantoprazole 40 mg daily 30 minutes before breakfast. Zofran 8 mg every 8 hours as needed.  Start Metamucil daily.  Stop Dicyclomine.  Drink at least 64 ounces of water daily.   You have been scheduled for an Upper GI Series at [facility] (1st floor registration). Your appointment is on Wednesday 01/09/20 at 10:30 am. Please arrive 15 minutes prior to your test for registration. Make sure not to eat or drink anything after midnight on the night before your test. If you need to reschedule, please call radiology at [phone]. ________________________________________________________________ An upper GI series uses x rays to help diagnose problems of the upper GI tract, which includes the esophagus, stomach, and duodenum. The duodenum is the first part of the small intestine. An upper GI series is conducted by a radiology technologist or a radiologist--a doctor who specializes in x-ray imaging--at a hospital or outpatient center. While sitting or standing in front of an x-ray machine, the patient drinks barium liquid, which is often white and has a chalky consistency and taste. The barium liquid coats the lining of the upper GI tract and makes signs of disease show up more clearly on x rays. X-ray video, called fluoroscopy, is used to view the barium liquid moving through the esophagus, stomach, and duodenum. Additional x rays and fluoroscopy are performed  while the patient lies on an x-ray table. To fully coat the upper GI tract with barium liquid, the technologist or radiologist may press on the abdomen or ask the patient to change position. Patients hold still in various positions, allowing the technologist or radiologist to take x rays of the upper GI tract at different angles. If a technologist conducts the upper GI series, a radiologist will later examine the images to look for problems.  This test typically takes about 1 hour to complete. __________________________________________________________________

## 2020-01-04 ENCOUNTER — Encounter: Payer: Self-pay | Admitting: Nurse Practitioner

## 2020-01-05 NOTE — Progress Notes (Signed)
Noted  

## 2020-01-09 ENCOUNTER — Ambulatory Visit (HOSPITAL_COMMUNITY): Admission: RE | Admit: 2020-01-09 | Payer: Medicaid Other | Source: Ambulatory Visit

## 2020-01-23 ENCOUNTER — Ambulatory Visit: Payer: Medicaid Other | Admitting: Nurse Practitioner

## 2020-01-23 ENCOUNTER — Ambulatory Visit (HOSPITAL_COMMUNITY): Admission: RE | Admit: 2020-01-23 | Payer: Medicaid Other | Source: Ambulatory Visit

## 2020-03-22 ENCOUNTER — Observation Stay (HOSPITAL_COMMUNITY): Payer: Medicaid Other

## 2020-03-22 ENCOUNTER — Emergency Department (HOSPITAL_COMMUNITY): Payer: Medicaid Other

## 2020-03-22 ENCOUNTER — Observation Stay (HOSPITAL_COMMUNITY)
Admission: EM | Admit: 2020-03-22 | Discharge: 2020-03-23 | Disposition: A | Discharge status: Home / Self Care | Payer: Medicaid Other | Attending: Emergency Medicine | Admitting: Emergency Medicine

## 2020-03-22 DIAGNOSIS — R569 Unspecified convulsions: Secondary | ICD-10-CM | POA: Diagnosis not present

## 2020-03-22 DIAGNOSIS — Z87891 Personal history of nicotine dependence: Secondary | ICD-10-CM | POA: Diagnosis not present

## 2020-03-22 DIAGNOSIS — Z20822 Contact with and (suspected) exposure to covid-19: Secondary | ICD-10-CM | POA: Insufficient documentation

## 2020-03-22 DIAGNOSIS — I4581 Long QT syndrome: Secondary | ICD-10-CM | POA: Diagnosis not present

## 2020-03-22 DIAGNOSIS — E876 Hypokalemia: Secondary | ICD-10-CM | POA: Diagnosis not present

## 2020-03-22 DIAGNOSIS — Z79899 Other long term (current) drug therapy: Secondary | ICD-10-CM | POA: Insufficient documentation

## 2020-03-22 DIAGNOSIS — R000 Tachycardia, unspecified: Secondary | ICD-10-CM

## 2020-03-22 DIAGNOSIS — R9431 Abnormal electrocardiogram [ECG] [EKG]: Secondary | ICD-10-CM | POA: Diagnosis not present

## 2020-03-22 DIAGNOSIS — R112 Nausea with vomiting, unspecified: Secondary | ICD-10-CM | POA: Diagnosis not present

## 2020-03-22 DIAGNOSIS — R1110 Vomiting, unspecified: Secondary | ICD-10-CM | POA: Insufficient documentation

## 2020-03-22 DIAGNOSIS — F1920 Other psychoactive substance dependence, uncomplicated: Secondary | ICD-10-CM | POA: Diagnosis not present

## 2020-03-22 DIAGNOSIS — J45909 Unspecified asthma, uncomplicated: Secondary | ICD-10-CM | POA: Insufficient documentation

## 2020-03-22 LAB — CBC WITH DIFFERENTIAL/PLATELET
Abs Immature Granulocytes: 0.09 10*3/uL — ABNORMAL HIGH (ref 0.00–0.07)
Basophils Absolute: 0 10*3/uL (ref 0.0–0.1)
Basophils Relative: 0 %
Eosinophils Absolute: 0 10*3/uL (ref 0.0–0.5)
Eosinophils Relative: 0 %
HCT: 46.4 % — ABNORMAL HIGH (ref 36.0–46.0)
Hemoglobin: 15.8 g/dL — ABNORMAL HIGH (ref 12.0–15.0)
Immature Granulocytes: 1 %
Lymphocytes Relative: 14 %
Lymphs Abs: 2.2 10*3/uL (ref 0.7–4.0)
MCH: 29 pg (ref 26.0–34.0)
MCHC: 34.1 g/dL (ref 30.0–36.0)
MCV: 85.3 fL (ref 80.0–100.0)
Monocytes Absolute: 1.2 10*3/uL — ABNORMAL HIGH (ref 0.1–1.0)
Monocytes Relative: 7 %
Neutro Abs: 12.9 10*3/uL — ABNORMAL HIGH (ref 1.7–7.7)
Neutrophils Relative %: 78 %
Platelets: 240 10*3/uL (ref 150–400)
RBC: 5.44 MIL/uL — ABNORMAL HIGH (ref 3.87–5.11)
RDW: 15.3 % (ref 11.5–15.5)
WBC: 16.4 10*3/uL — ABNORMAL HIGH (ref 4.0–10.5)
nRBC: 0 % (ref 0.0–0.2)

## 2020-03-22 LAB — COMPREHENSIVE METABOLIC PANEL
ALT: 57 U/L — ABNORMAL HIGH (ref 0–44)
AST: 59 U/L — ABNORMAL HIGH (ref 15–41)
Albumin: 4.3 g/dL (ref 3.5–5.0)
Alkaline Phosphatase: 88 U/L (ref 38–126)
Anion gap: 16 — ABNORMAL HIGH (ref 5–15)
BUN: 8 mg/dL (ref 6–20)
CO2: 22 mmol/L (ref 22–32)
Calcium: 9.9 mg/dL (ref 8.9–10.3)
Chloride: 102 mmol/L (ref 98–111)
Creatinine, Ser: 0.71 mg/dL (ref 0.44–1.00)
GFR calc Af Amer: >60 mL/min (ref >60)
GFR calc non Af Amer: >60 mL/min (ref >60)
Glucose, Bld: 125 mg/dL — ABNORMAL HIGH (ref 70–99)
Potassium: 2.9 mmol/L — ABNORMAL LOW (ref 3.5–5.1)
Sodium: 140 mmol/L (ref 135–145)
Total Bilirubin: 1.2 mg/dL (ref 0.3–1.2)
Total Protein: 8 g/dL (ref 6.5–8.1)

## 2020-03-22 LAB — CBG MONITORING, ED: Glucose-Capillary: 165 mg/dL — ABNORMAL HIGH (ref 70–99)

## 2020-03-22 LAB — PHOSPHORUS: Phosphorus: 2.1 mg/dL — ABNORMAL LOW (ref 2.5–4.6)

## 2020-03-22 LAB — HIV ANTIBODY (ROUTINE TESTING W REFLEX): HIV Screen 4th Generation wRfx: NONREACTIVE

## 2020-03-22 LAB — MAGNESIUM: Magnesium: 2.5 mg/dL — ABNORMAL HIGH (ref 1.7–2.4)

## 2020-03-22 LAB — D-DIMER, QUANTITATIVE: D-Dimer, Quant: 0.33 ug/mL-FEU (ref 0.00–0.50)

## 2020-03-22 LAB — SARS CORONAVIRUS 2 BY RT PCR (HOSPITAL ORDER, PERFORMED IN ~~LOC~~ HOSPITAL LAB): SARS Coronavirus 2: NEGATIVE

## 2020-03-22 LAB — LIPASE, BLOOD: Lipase: 22 U/L (ref 11–51)

## 2020-03-22 MED ORDER — ACETAMINOPHEN 650 MG RE SUPP: 650.0000 mg | RECTAL | Status: DC | PRN

## 2020-03-22 MED ORDER — LEVETIRACETAM 750 MG PO TABS
750.0000 mg | ORAL_TABLET | Freq: Two times a day (BID) | ORAL | Status: DC
  Administered 2020-03-23: 750 mg via ORAL
  Filled 2020-03-22: qty 1

## 2020-03-22 MED ORDER — SODIUM CHLORIDE 0.9 % IV SOLN
75.0000 mL/h | INTRAVENOUS | Status: DC
  Administered 2020-03-22: 75 mL/h via INTRAVENOUS

## 2020-03-22 MED ORDER — RISPERIDONE 1 MG PO TABS: 1.0000 mg | ORAL_TABLET | Freq: Two times a day (BID) | ORAL | Status: DC

## 2020-03-22 MED ORDER — CALCIUM CARBONATE ANTACID 500 MG PO CHEW
1.0000 | CHEWABLE_TABLET | Freq: Two times a day (BID) | ORAL | Status: DC
  Administered 2020-03-22 – 2020-03-23 (×2): 200 mg via ORAL
  Filled 2020-03-22 (×2): qty 1

## 2020-03-22 MED ORDER — LORAZEPAM 2 MG/ML IJ SOLN
1.0000 mg | Freq: Once | INTRAMUSCULAR | Status: AC
  Administered 2020-03-22: 1 mg via INTRAVENOUS
  Filled 2020-03-22: qty 1

## 2020-03-22 MED ORDER — ACETAMINOPHEN 325 MG PO TABS
650.0000 mg | ORAL_TABLET | ORAL | Status: DC | PRN
  Administered 2020-03-22: 650 mg via ORAL
  Filled 2020-03-22: qty 2

## 2020-03-22 MED ORDER — TRIMETHOBENZAMIDE HCL 100 MG/ML IM SOLN
200.0000 mg | Freq: Three times a day (TID) | INTRAMUSCULAR | Status: DC | PRN
  Administered 2020-03-22 – 2020-03-23 (×2): 200 mg via INTRAMUSCULAR
  Filled 2020-03-22 (×3): qty 2

## 2020-03-22 MED ORDER — MAGNESIUM SULFATE 2 GM/50ML IV SOLN
2.0000 g | Freq: Once | INTRAVENOUS | Status: AC
  Administered 2020-03-22: 2 g via INTRAVENOUS
  Filled 2020-03-22: qty 50

## 2020-03-22 MED ORDER — LEVETIRACETAM IN NACL 1000 MG/100ML IV SOLN
1000.0000 mg | Freq: Once | INTRAVENOUS | Status: AC
  Administered 2020-03-22: 1000 mg via INTRAVENOUS
  Filled 2020-03-22: qty 100

## 2020-03-22 MED ORDER — POTASSIUM CHLORIDE 10 MEQ/100ML IV SOLN
10.0000 meq | INTRAVENOUS | Status: AC
  Administered 2020-03-22 (×3): 10 meq via INTRAVENOUS
  Filled 2020-03-22 (×3): qty 100

## 2020-03-22 MED ORDER — LABETALOL HCL 5 MG/ML IV SOLN
10.0000 mg | INTRAVENOUS | Status: DC | PRN
  Administered 2020-03-22 – 2020-03-23 (×4): 10 mg via INTRAVENOUS
  Filled 2020-03-22 (×4): qty 4

## 2020-03-22 MED ORDER — MELATONIN 3 MG PO TABS
3.0000 mg | ORAL_TABLET | Freq: Every day | ORAL | Status: DC
  Administered 2020-03-22: 3 mg via ORAL
  Filled 2020-03-22: qty 1

## 2020-03-22 MED ORDER — SODIUM CHLORIDE 0.9 % IV BOLUS
1000.0000 mL | Freq: Once | INTRAVENOUS | Status: AC
  Administered 2020-03-22: 1000 mL via INTRAVENOUS

## 2020-03-22 MED ORDER — SODIUM CHLORIDE 0.9 % IV SOLN: INTRAVENOUS | Status: DC

## 2020-03-22 MED ORDER — ALPRAZOLAM 0.25 MG PO TABS
0.2500 mg | ORAL_TABLET | Freq: Three times a day (TID) | ORAL | Status: DC | PRN
  Administered 2020-03-22 – 2020-03-23 (×2): 0.25 mg via ORAL
  Filled 2020-03-22 (×3): qty 1

## 2020-03-22 MED ORDER — POTASSIUM CHLORIDE CRYS ER 20 MEQ PO TBCR
60.0000 meq | EXTENDED_RELEASE_TABLET | Freq: Once | ORAL | Status: AC
  Administered 2020-03-22: 60 meq via ORAL
  Filled 2020-03-22: qty 3

## 2020-03-22 MED ORDER — LAMOTRIGINE 25 MG PO TABS
25.0000 mg | ORAL_TABLET | Freq: Two times a day (BID) | ORAL | Status: DC
  Administered 2020-03-22 – 2020-03-23 (×2): 25 mg via ORAL
  Filled 2020-03-22 (×3): qty 1

## 2020-03-22 NOTE — ED Notes (Signed)
PT had one episode of brown/green emesis

## 2020-03-22 NOTE — ED Triage Notes (Signed)
Pt arrives EMS from home for a grand maln seizure witnessed by her husband this morning. Pt has hx of seizures and is currently on medication for them. Pt had N/V for 2 days prior to seizure this morning. Pt alert and oriented x 4. Pt heart rate 145, EMS gave pt NS bouls and heart rate decreased to 118. Pt heart rate is currently 134. Pt also states she has central chest pain feeling like a sharpness in her central chest

## 2020-03-22 NOTE — ED Notes (Addendum)
This user attempted once to draw a CMP and was unsuccessful

## 2020-03-22 NOTE — Plan of Care (Signed)
  Problem: Education: Goal: Knowledge of General Education information will improve Description Including pain rating scale, medication(s)/side effects and non-pharmacologic comfort measures Outcome: Progressing   

## 2020-03-22 NOTE — H&P (Addendum)
History and Physical    Jacqueline Jones FVC:[id number] DOB: 03-16-1991 DOA: 03/22/2020  PCP: Norm Salt, PA (Confirm with patient/family/NH records and if not entered, this has to be entered at [facility] point of entry) Patient coming from: Home  I have personally briefly reviewed patient's old medical records in [facility]  Chief Complaint: Nausea, Vomiting, seizure  HPI: Jacqueline Jones is a 29 y.o. female with medical history significant of seizure disorder, HTN, IBS, asthma, anxiety/depression, presented with persistent nausea with vomiting, unable to take any p.o. intakes for 2 days and witnessed seizure x1 today.  Patient admitted has been using marijuana for 5 to 25-month on weekly basis, but not remembering when was the last time she used marijuana at this time.  Husband notes me that patient probably used marijuana earlier this week.  For the last 2 days, patient has been having persistent symptoms of feeling nausea frequent vomiting unable to eat any food or large amount of food, she tried to swallow her seizure pills but vomited out immediately after taking them.  She denied any abdominal pain, no diarrhea no fever chills no cough no urinary symptoms.  This morning, patient suddenly become unconscious and seizing for about 1 minute with whole body shaking I both going back and she also bit her tongue on the right side.  Whole episode lasted about 1 minute, with post ictal confusion for about 2 to 3 minutes. ED Course: Awake, neurologically intact, EKG showed significant QTC prolongation, WBC 16.  Review of Systems: As per HPI otherwise 14 point review of systems negative.    Past Medical History:  Diagnosis Date  . Anemia   . Anxiety   . Anxiety   . Asthma   . Depression   . Depression   . Elevated cholesterol   . Frequent headaches   . High blood pressure   . IBS (irritable bowel syndrome)   . Infection    UTI  . Obesity   . Pneumonia   . Seizures (HCC)     unknown cause- started 08/14  "monthly"  . Sepsis (HCC)   . UTI (lower urinary tract infection)     Past Surgical History:  Procedure Laterality Date  . COLONOSCOPY     age [age]  . DIAGNOSTIC LAPAROSCOPY       reports that she has quit smoking. Her smoking use included cigarettes. She has a 2.50 pack-year smoking history. She has quit using smokeless tobacco. She reports previous drug use. Frequency: 7.00 times per week. She reports that she does not drink alcohol.  Allergies  Allergen Reactions  . Toradol [Ketorolac Tromethamine] Itching    seizure  . Clonazepam Other (See Comments)    Seizure  . Flexeril [Cyclobenzaprine] Other (See Comments)    seizure  . Gabapentin Other (See Comments)    seizures   . Tramadol Itching    Seizures  . Vicodin [Hydrocodone-Acetaminophen] Itching    Pt states she can take Tylenol without difficulty.  . Adhesive [Tape] Rash  . Azithromycin Rash  . Benzonatate Rash  . Keflet [Cephalexin] Rash    Did it involve swelling of the face/tongue/throat, SOB, or low BP? No Did it involve sudden or severe rash/hives, skin peeling, or any reaction on the inside of your mouth or nose? No Did you need to seek medical attention at a hospital or doctor's office? No When did it last happen? If all above answers are "NO", may proceed with cephalosporin use.   Marland Kitchen  Latex Rash  . Levaquin [Levofloxacin In D5w] Rash    Family History  Problem Relation Age of Onset  . Drug abuse Father   . Cancer Father        age orange  . Lung cancer Father   . Depression Mother   . Anxiety disorder Mother   . Drug abuse Brother        incarcirated   . Colon cancer Neg Hx      Prior to Admission medications   Medication Sig Start Date End Date Taking? Authorizing Provider  albuterol (VENTOLIN HFA) 108 (90 Base) MCG/ACT inhaler Inhale 2 puffs into the lungs every 6 (six) hours as needed for wheezing or shortness of breath. 11/06/19   Clapacs, Jackquline Denmark, MD  lamoTRIgine (LAMICTAL) 25 MG tablet Take 1 tablet (25 mg total) by mouth 2 (two) times daily. 11/06/19   Clapacs, Jackquline Denmark, MD  levETIRAcetam (KEPPRA) 750 MG tablet Take 1 tablet (750 mg total) by mouth 2 (two) times daily. 11/06/19   Clapacs, Jackquline Denmark, MD  ondansetron (ZOFRAN) 8 MG tablet Take 1 tablet (8 mg total) by mouth every 8 (eight) hours as needed for nausea or vomiting. 01/03/20   Meredith Pel, NP  pantoprazole (PROTONIX) 40 MG tablet Take 1 tablet (40 mg total) by mouth daily. 01/03/20   Meredith Pel, NP  promethazine (PHENERGAN) 25 MG tablet TAKE 1/2 TABLET BY MOUTH EVERY 8 HOURS AS NEEDED FOR NAUSEA AND VOMITING 12/27/19   Meredith Pel, NP  risperiDONE (RISPERDAL PO) Take 1 mg by mouth in the morning and at bedtime.    [provider]    Physical Exam: Vitals:   03/22/20 1200 03/22/20 1245 03/22/20 1345 03/22/20 1500  BP: (!) 133/92 (!) 129/94 (!) 155/115 (!) 145/95  Pulse: (!) 134 (!) 115 (!) 116 (!) 124  Resp: 16 (!) 21 18 17   Temp: 99 F (37.2 C)     TempSrc: Oral     SpO2: 96% 97% 95% 96%    Constitutional: NAD, calm, comfortable Vitals:   03/22/20 1200 03/22/20 1245 03/22/20 1345 03/22/20 1500  BP: (!) 133/92 (!) 129/94 (!) 155/115 (!) 145/95  Pulse: (!) 134 (!) 115 (!) 116 (!) 124  Resp: 16 (!) 21 18 17   Temp: 99 F (37.2 C)     TempSrc: Oral     SpO2: 96% 97% 95% 96%   Eyes: PERRL, lids and conjunctivae normal ENMT: Mucous membranes are moist. Posterior pharynx clear of any exudate or lesions.Normal dentition. Tongue bitten mark on the right side Neck: normal, supple, no masses, no thyromegaly Respiratory: clear to auscultation bilaterally, no wheezing, no crackles. Normal respiratory effort. No accessory muscle use.  Cardiovascular: Regular rate and rhythm, no murmurs / rubs / gallops. No extremity edema. 2+ pedal pulses. No carotid bruits.  Abdomen: no tenderness, no masses palpated. No hepatosplenomegaly. Bowel sounds positive.   Musculoskeletal: no clubbing / cyanosis. No joint deformity upper and lower extremities. Good ROM, no contractures. Normal muscle tone.  Skin: no rashes, lesions, ulcers. No induration Neurologic: CN 2-12 grossly intact. Sensation intact, DTR normal. Strength 5/5 in all 4.  Psychiatric: Normal judgment and insight. Alert and oriented x 3. Normal mood.     Labs on Admission: I have personally reviewed following labs and imaging studies  CBC: Recent Labs  Lab 03/22/20 1218  WBC 16.4*  NEUTROABS 12.9*  HGB 15.8*  HCT 46.4*  MCV 85.3  PLT 240   Basic Metabolic Panel:  Recent Labs  Lab 03/22/20 1426  NA 140  K 2.9*  CL 102  CO2 22  GLUCOSE 125*  BUN 8  CREATININE 0.71  CALCIUM 9.9  MG 2.5*  PHOS 2.1*   GFR: CrCl cannot be calculated (Unknown ideal weight.). Liver Function Tests: Recent Labs  Lab 03/22/20 1426  AST 59*  ALT 57*  ALKPHOS 88  BILITOT 1.2  PROT 8.0  ALBUMIN 4.3   Recent Labs  Lab 03/22/20 1219  LIPASE 22   No results for input(s): AMMONIA in the last 168 hours. Coagulation Profile: No results for input(s): INR, PROTIME in the last 168 hours. Cardiac Enzymes: No results for input(s): CKTOTAL, CKMB, CKMBINDEX, TROPONINI in the last 168 hours. BNP (last 3 results) No results for input(s): PROBNP in the last 8760 hours. HbA1C: No results for input(s): HGBA1C in the last 72 hours. CBG: Recent Labs  Lab 03/22/20 1228  GLUCAP 165*   Lipid Profile: No results for input(s): CHOL, HDL, LDLCALC, TRIG, CHOLHDL, LDLDIRECT in the last 72 hours. Thyroid Function Tests: No results for input(s): TSH, T4TOTAL, FREET4, T3FREE, THYROIDAB in the last 72 hours. Anemia Panel: No results for input(s): VITAMINB12, FOLATE, FERRITIN, TIBC, IRON, RETICCTPCT in the last 72 hours. Urine analysis:    Component Value Date/Time   COLORURINE YELLOW 11/03/2019 1118   APPEARANCEUR CLOUDY (A) 11/03/2019 1118   LABSPEC 1.020 11/03/2019 1118   PHURINE 5.0 11/03/2019  1118   GLUCOSEU NEGATIVE 11/03/2019 1118   HGBUR LARGE (A) 11/03/2019 1118   BILIRUBINUR NEGATIVE 11/03/2019 1118   KETONESUR NEGATIVE 11/03/2019 1118   PROTEINUR 100 (A) 11/03/2019 1118   UROBILINOGEN 0.2 05/08/2014 1600   NITRITE POSITIVE (A) 11/03/2019 1118   LEUKOCYTESUR TRACE (A) 11/03/2019 1118    Radiological Exams on Admission: No results found.  EKG: Independently reviewed. Prolonged QTCs  Assessment/Plan Active Problems:   Seizure (HCC)  (please populate well all problems here in Problem List. (For example, if patient is on BP meds at home and you resume or decide to hold them, it is a problem that needs to be her. Same for CAD, COPD, HLD and so on)  Noncompliant seizure secondary to intractable nauseous vomiting -Patient still quite symptomatic, doubt he will tolerate p.o. today, start IV fluids -Received IV Keppra loading in the ED -Restart p.o. Keppra and Lamictal ASAP -CT head -Seizure precautions.  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  Both understood and agreed.  Hypokalemia -From persistent vomiting -PO and IV replace, and recheck. -Mg  Leukocytosis -Probably reactive to seizure -Chest x-ray to rule out aspiration, although low suspicion given there is no cough or shortness of breath  Prolonged QTC -Avoid QTC punctation medications -Repeat EKG in AM  Sinus tachycardia -Probably from dehydration partly from reaction to seizure probably -IV fluid, as needed labetalol  Anxiety/depression -Denied any suicidal ideation.  DVT prophylaxis: SCD Code Status: Full Family Communication: Husband over phone Disposition Plan: Expect discharge within 24 hours outpatient resume p.o. intakes Consults called: None Admission status: Medsurg Obs  Emeline General MD [facility] Pager [phone]  03/22/2020, 4:35 PM

## 2020-03-22 NOTE — ED Provider Notes (Signed)
[facility] EMERGENCY DEPARTMENT Provider Note   CSN: [id number] Arrival date & time: 03/22/20  1155     History No chief complaint on file.   Jacqueline Jones is a 29 y.o. female who presents to the ED for cc of seizure.  She has a history of seizure disorder and is on Lamictal and Keppra.  She states she has been compliant with her medications but she has had about 2 days of vomiting, sleeplessness and has not been able to hold down her medications for the past 2 days.  The patient denies abdominal pain, diarrhea, constipation, history of abdominal surgeries or small bowel obstruction.  She feels like her sleeplessness has contributed to her seizure today.  Her seizure was witnessed by her husband who called EMS.  EMS gave the patient 500 of normal saline prior to arrival due to tachycardia.  Patient did bite her tongue but had no incontinence.  She is back to baseline and alert and oriented.  HPI     Past Medical History:  Diagnosis Date  . Anemia   . Anxiety   . Anxiety   . Asthma   . Depression   . Depression   . Elevated cholesterol   . Frequent headaches   . High blood pressure   . IBS (irritable bowel syndrome)   . Infection    UTI  . Obesity   . Pneumonia   . Seizures (HCC)    unknown cause- started 08/14  "monthly"  . Sepsis (HCC)   . UTI (lower urinary tract infection)     Patient Active Problem List   Diagnosis Date Noted  . Hypokalemia   . Non-intractable vomiting   . QT prolongation   . Overdose of sedative or hypnotic 11/06/2019  . Major depressive disorder, recurrent episode, severe (HCC) 11/04/2019  . Seizure (HCC) 11/03/2019  . Medical non-compliance 05/13/2014  . Neurologic abnormality 05/13/2014  . Folliculitis 05/13/2014  . UTI (lower urinary tract infection) 02/18/2014  . Pupil asymmetry 02/05/2014  . Polysubstance dependence (HCC) 02/28/2012  . GAD (generalized anxiety disorder) 09/02/2011    Past Surgical History:   Procedure Laterality Date  . COLONOSCOPY     age 54 2  . DIAGNOSTIC LAPAROSCOPY       OB History    Gravida  0   Para  0   Term      Preterm      AB      Living        SAB      TAB      Ectopic      Multiple      Live Births              Family History  Problem Relation Age of Onset  . Drug abuse Father   . Cancer Father        age orange  . Lung cancer Father   . Depression Mother   . Anxiety disorder Mother   . Drug abuse Brother        incarcirated   . Colon cancer Neg Hx     Social History   Tobacco Use  . Smoking status: Former Smoker    Packs/day: 0.25    Years: 10.00    Pack years: 2.50    Types: Cigarettes  . Smokeless tobacco: Former Neurosurgeon  . Tobacco comment: she is wanting to quit trying  Substance Use Topics  . Alcohol use: No  .  Drug use: Not Currently    Frequency: 7.0 times per week    Home Medications Prior to Admission medications   Medication Sig Start Date End Date Taking? Authorizing Provider  albuterol (VENTOLIN HFA) 108 (90 Base) MCG/ACT inhaler Inhale 2 puffs into the lungs every 6 (six) hours as needed for wheezing or shortness of breath. 11/06/19  Yes Clapacs, Jackquline DenmarkJohn T, MD  lamoTRIgine (LAMICTAL) 25 MG tablet Take 1 tablet (25 mg total) by mouth 2 (two) times daily. 11/06/19  Yes Clapacs, Jackquline DenmarkJohn T, MD  levETIRAcetam (KEPPRA) 750 MG tablet Take 1 tablet (750 mg total) by mouth 2 (two) times daily. 11/06/19  Yes Clapacs, Jackquline DenmarkJohn T, MD  pantoprazole (PROTONIX) 40 MG tablet Take 1 tablet (40 mg total) by mouth daily. 01/03/20  Yes Meredith PelGuenther, Paula M, NP  calcium carbonate (TUMS - DOSED IN MG ELEMENTAL CALCIUM) 500 MG chewable tablet Chew 1 tablet (200 mg of elemental calcium total) by mouth 2 (two) times daily with a meal. 03/23/20   Swayze, Ava, DO  melatonin 3 MG TABS tablet Take 1 tablet (3 mg total) by mouth at bedtime. 03/23/20   Swayze, Ava, DO  ondansetron (ZOFRAN) 8 MG tablet Take 1 tablet (8 mg total) by mouth every  8 (eight) hours as needed for nausea or vomiting. Patient not taking: Reported on 03/22/2020 01/03/20   Meredith PelGuenther, Paula M, NP  promethazine (PHENERGAN) 25 MG tablet TAKE 1/2 TABLET BY MOUTH EVERY 8 HOURS AS NEEDED FOR NAUSEA AND VOMITING Patient not taking: Reported on 03/22/2020 12/27/19   Meredith PelGuenther, Paula M, NP    Allergies    Toradol [ketorolac tromethamine], Clonazepam, Flexeril [cyclobenzaprine], Gabapentin, Tramadol, Vicodin [hydrocodone-acetaminophen], Adhesive [tape], Azithromycin, Benzonatate, Keflet [cephalexin], Latex, and Levaquin [levofloxacin in d5w]  Review of Systems   Review of Systems Ten systems reviewed and are negative for acute change, except as noted in the HPI.   Physical Exam Updated Vital Signs BP (!) 150/103 (BP Location: Left Arm)   Pulse 76   Temp 98.3 F (36.8 C) (Oral)   Resp 18   SpO2 97%   Physical Exam Physical Exam  Nursing note and vitals reviewed. Constitutional: She is oriented to person, place, and time. She appears well-developed and well-nourished. No distress.  HENT:  Head: Normocephalic and bite mark to the right side of the tongue Eyes: Conjunctivae normal and EOM are normal. Pupils are equal, round, and reactive to light. No scleral icterus.  Neck: Normal range of motion.  Cardiovascular: Normal rate, regular rhythm and normal heart sounds.  Exam reveals no gallop and no friction rub.   No murmur heard. Pulmonary/Chest: Effort normal and breath sounds normal. No respiratory distress.  Abdominal: Soft. Bowel sounds are normal. She exhibits no distension and no mass. There is no tenderness. There is no guarding.  Neurological: She is alert and oriented to person, place, and time.  Skin: Skin is warm and dry. She is not diaphoretic.    ED Results / Procedures / Treatments   Labs (all labs ordered are listed, but only abnormal results are displayed) Labs Reviewed  CBC WITH DIFFERENTIAL/PLATELET - Abnormal; Notable for the following  components:      Result Value   WBC 16.4 (*)    RBC 5.44 (*)    Hemoglobin 15.8 (*)    HCT 46.4 (*)    Neutro Abs 12.9 (*)    Monocytes Absolute 1.2 (*)    Abs Immature Granulocytes 0.09 (*)    All other components within normal limits  COMPREHENSIVE METABOLIC PANEL - Abnormal; Notable for the following components:   Potassium 2.9 (*)    Glucose, Bld 125 (*)    AST 59 (*)    ALT 57 (*)    Anion gap 16 (*)    All other components within normal limits  MAGNESIUM - Abnormal; Notable for the following components:   Magnesium 2.5 (*)    All other components within normal limits  PHOSPHORUS - Abnormal; Notable for the following components:   Phosphorus 2.1 (*)    All other components within normal limits  CBC - Abnormal; Notable for the following components:   WBC 13.8 (*)    RBC 5.21 (*)    Hemoglobin 15.2 (*)    RDW 15.7 (*)    All other components within normal limits  BASIC METABOLIC PANEL - Abnormal; Notable for the following components:   CO2 19 (*)    Glucose, Bld 109 (*)    BUN <5 (*)    Calcium 8.6 (*)    All other components within normal limits  CBG MONITORING, ED - Abnormal; Notable for the following components:   Glucose-Capillary 165 (*)    All other components within normal limits  SARS CORONAVIRUS 2 BY RT PCR (HOSPITAL ORDER, PERFORMED IN [facility])  LIPASE, BLOOD  D-DIMER, QUANTITATIVE (NOT AT [facility])  HIV ANTIBODY (ROUTINE TESTING W REFLEX)  URINALYSIS, ROUTINE W REFLEX MICROSCOPIC  RAPID URINE DRUG SCREEN, HOSP PERFORMED  I-STAT BETA HCG BLOOD, ED ([facility], [facility], [facility] ONLY)  TROPONIN I (HIGH SENSITIVITY)  TROPONIN I (HIGH SENSITIVITY)    EKG EKG Interpretation  Date/Time:  Saturday March 22 2020 11:58:52 EDT Ventricular Rate:  121 PR Interval:    QRS Duration: 81 QT Interval:  462 QTC Calculation: 656 R Axis:   2 Text Interpretation: Sinus tachycardia Low voltage, precordial leads Nonspecific repol abnormality, diffuse leads Prolonged QT  interval Confirmed by Tilden Fossa [id number]) on 03/22/2020 12:00:49 PM   Radiology DG Chest 1 View  Result Date: 03/22/2020 CLINICAL DATA:  Seizure. EXAM: CHEST  1 VIEW COMPARISON:  November 03, 2019 FINDINGS: The heart size and mediastinal contours are within normal limits. Both lungs are clear. The visualized skeletal structures are unremarkable. IMPRESSION: No active disease. Electronically Signed   By: Gerome Sam III M.D   On: 03/22/2020 16:46   CT HEAD WO CONTRAST  Result Date: 03/22/2020 CLINICAL DATA:  Nontraumatic seizure. EXAM: CT HEAD WITHOUT CONTRAST TECHNIQUE: Contiguous axial images were obtained from the base of the skull through the vertex without intravenous contrast. COMPARISON:  CT head 11/03/2019 FINDINGS: Brain: No evidence of acute infarction, hemorrhage, hydrocephalus, extra-axial collection or mass lesion/mass effect. Vascular: No hyperdense vessel or unexpected calcification. Skull: Normal. Negative for fracture or focal lesion. Sinuses/Orbits: No acute finding. Other: None. IMPRESSION: No acute intracranial pathology. Electronically Signed   By: Emmaline Kluver M.D.   On: 03/22/2020 17:00    Procedures .Critical Care Performed by: Arthor Captain, PA-C Authorized by: Arthor Captain, PA-C   Critical care provider statement:    Critical care time (minutes):  50   Critical care time was exclusive of:  Separately billable procedures and treating other patients   Critical care was necessary to treat or prevent imminent or life-threatening deterioration of the following conditions:  Metabolic crisis (Severe hypokalemia, prolonged QT)   Critical care was time spent personally by me on the following activities:  Discussions with consultants, evaluation of patient's response to treatment, examination of patient, ordering and performing  treatments and interventions, ordering and review of laboratory studies, ordering and review of radiographic studies, pulse oximetry,  re-evaluation of patient's condition, obtaining history from patient or surrogate and review of old charts   (including critical care time)  Medications Ordered in ED Medications  trimethobenzamide (TIGAN) injection 200 mg (200 mg Intramuscular Given 03/23/20 0229)  0.9 %  sodium chloride infusion (has no administration in time range)  labetalol (NORMODYNE) injection 10 mg (10 mg Intravenous Given 03/23/20 0753)  lamoTRIgine (LAMICTAL) tablet 25 mg (25 mg Oral Given 03/23/20 0913)  levETIRAcetam (KEPPRA) tablet 750 mg (750 mg Oral Given 03/23/20 0913)  acetaminophen (TYLENOL) tablet 650 mg (650 mg Oral Given 03/22/20 1825)    Or  acetaminophen (TYLENOL) suppository 650 mg ( Rectal See Alternative 03/22/20 1825)  calcium carbonate (TUMS - dosed in mg elemental calcium) chewable tablet 200 mg of elemental calcium (200 mg of elemental calcium Oral Given 03/23/20 0752)  ALPRAZolam (XANAX) tablet 0.25 mg (0.25 mg Oral Given 03/23/20 0550)  melatonin tablet 3 mg (3 mg Oral Given 03/22/20 2230)  magnesium sulfate IVPB 2 g 50 mL (0 g Intravenous Stopped 03/22/20 1455)  LORazepam (ATIVAN) injection 1 mg (1 mg Intravenous Given 03/22/20 1246)  levETIRAcetam (KEPPRA) IVPB 1000 mg/100 mL premix (0 mg Intravenous Stopped 03/22/20 1343)  sodium chloride 0.9 % bolus 1,000 mL (0 mLs Intravenous Stopped 03/22/20 1723)  sodium chloride 0.9 % bolus 1,000 mL (1,000 mLs Intravenous New Bag/Given 03/22/20 1721)  potassium chloride SA (KLOR-CON) CR tablet 60 mEq (60 mEq Oral Given 03/22/20 1612)  potassium chloride 10 mEq in 100 mL IVPB (10 mEq Intravenous New Bag/Given 03/22/20 1834)  alum & mag hydroxide-simeth (MAALOX/MYLANTA) 200-200-20 MG/5ML suspension 30 mL (30 mLs Oral Given 03/23/20 0913)    And  lidocaine (XYLOCAINE) 2 % viscous mouth solution 15 mL (15 mLs Oral Given 03/23/20 7353)    ED Course  I have reviewed the triage vital signs and the nursing notes.  Pertinent labs & imaging results that were available  during my care of the patient were reviewed by me and considered in my medical decision making (see chart for details).    MDM Rules/Calculators/A&P                          10:42 AM Patient persistently tachycardic despite fluids and benzodiazepines. She is c/o pleuritic cp. I have ordered a d-dimer. She is profoundly hypokalemic with a QTc  of 656.    CC: Seizure and intractable vomiting VS:  Vitals:   03/22/20 2327 03/23/20 0312 03/23/20 0740 03/23/20 0741  BP: 134/87 (!) 172/108 (!) 150/103 (!) 150/103  Pulse: 92 63  76  Resp: 18 19  18   Temp: 98.5 F (36.9 C) 98.7 F (37.1 C) 98.3 F (36.8 C) 98.3 F (36.8 C)  TempSrc: Oral Oral Oral Oral  SpO2: 94% 93% 97% 97%    is gathered by patient and EMR. Previous records obtained and reviewed. DDX:The patient's complaint of seizure involves an extensive number of diagnostic and treatment options, and is a complaint that carries with it a high risk of complications, morbidity, and potential mortality. Given the large differential diagnosis, medical decision making is of high complexity. The differential diagnosis for includes but is not limited to idiopathic seizure, traumatic brain injury, intracranial hemorrhage, vascular lesion, mass or space containing lesion, degenerative neurologic disease, congenital brain abnormality, infectious etiology such as meningitis, encephalitis or abscess, metabolic disturbance including hyper or  hypoglycemia, hyper or hyponatremia, hyperosmolar state, uremia, hepatic failure, hypocalcemia, hypomagnesemia.  Toxic substances such as cocaine, lidocaine, antidepressants, theophylline, alcohol withdrawal, drug withdrawal, eclampsia, hypertensive encephalopathy and anoxic brain injury.  Labs: I ordered reviewed and interpreted labs which include CBC which is elevated white blood cell count and elevated hemoglobin likely acute phase reaction and volume contraction due to vomiting Magnesium level mildly  elevated after administration of IV magnesium CMP with potassium of 2.9, mildly elevated AST and ALT, anion gap acidosis just above normal at 16 likely due to volume contraction and vomiting. Phosphorus level low at 2.1 Covid test pending.  D-dimer pending. Imaging: I ordered and reviewed images which included 1 view chest x-ray. I independently visualized and interpreted all imaging. There are no acute, significant findings on today's images. EKG: Sinus tachycardia at a rate of 121 with QTC of 656 Consults: I consulted with ED pharmacist for alternative antiemetic medications that would not cause prolonged QT syndrome.  Patient given IM Tigan with significant improvement in her vomiting. MDM: Patient here with intractable vomiting, hypokalemia, seizure, inability to hold down seizure medications for the last 2 days, profoundly long QTC.  Patient is also tachycardic which appears to be normal for patient in reviewing her previous flowsheets however she is above normal today and I think that this is likely secondary to her seizure along with being volume depleted.  I gave the patient IV Ativan, fluids, magnesium for QT prolongation, IV and oral potassium, loading dose of Keppra.  Patient nausea is significantly improved after medications.  She would need admission to the hospitalist.  Case discussed with Dr. Chipper Herb for admission. Patient disposition: The patient appears reasonably stabilized for admission considering the current resources, flow, and capabilities available in the ED at this time, and I doubt any other [facility] requiring further screening and/or treatment in the ED prior to admission.        Final Clinical Impression(s) / ED Diagnoses Final diagnoses:  QT prolongation  Hypokalemia  Non-intractable vomiting with nausea, unspecified vomiting type  Seizure (HCC)  Tachycardia    Rx / DC Orders ED Discharge Orders         Ordered    calcium carbonate (TUMS - DOSED IN MG ELEMENTAL  CALCIUM) 500 MG chewable tablet  2 times daily with meals        03/23/20 1030    melatonin 3 MG TABS tablet  Daily at bedtime        03/23/20 1030    Activity as tolerated - No restrictions        03/23/20 1030    Increase activity slowly        03/23/20 1030    Diet - low sodium heart healthy        03/23/20 1030    Call MD for:  persistant nausea and vomiting        03/23/20 1030    Discharge instructions       Comments: Discharge to home Follow up with PCP in 7-10 days. Avoid Cannabis   03/23/20 1030    Call MD for:  difficulty breathing, headache or visual disturbances        03/23/20 1030    Call MD for:  persistant dizziness or light-headedness        03/23/20 1030           Arthor Captain, PA-C 03/23/20 1042    Benjiman Core, MD 03/23/20 1434

## 2020-03-23 DIAGNOSIS — E876 Hypokalemia: Secondary | ICD-10-CM | POA: Diagnosis not present

## 2020-03-23 DIAGNOSIS — R112 Nausea with vomiting, unspecified: Secondary | ICD-10-CM | POA: Diagnosis not present

## 2020-03-23 DIAGNOSIS — Z20822 Contact with and (suspected) exposure to covid-19: Secondary | ICD-10-CM | POA: Diagnosis not present

## 2020-03-23 DIAGNOSIS — R9431 Abnormal electrocardiogram [ECG] [EKG]: Secondary | ICD-10-CM | POA: Diagnosis not present

## 2020-03-23 DIAGNOSIS — F12188 Cannabis abuse with other cannabis-induced disorder: Secondary | ICD-10-CM

## 2020-03-23 DIAGNOSIS — R569 Unspecified convulsions: Secondary | ICD-10-CM | POA: Diagnosis not present

## 2020-03-23 LAB — TROPONIN I (HIGH SENSITIVITY)
Troponin I (High Sensitivity): 16 ng/L (ref <18)
Troponin I (High Sensitivity): 15 ng/L (ref <18)

## 2020-03-23 LAB — BASIC METABOLIC PANEL
Anion gap: 11 (ref 5–15)
BUN: <5 mg/dL — ABNORMAL LOW (ref 6–20)
CO2: 19 mmol/L — ABNORMAL LOW (ref 22–32)
Calcium: 8.6 mg/dL — ABNORMAL LOW (ref 8.9–10.3)
Chloride: 108 mmol/L (ref 98–111)
Creatinine, Ser: 0.66 mg/dL (ref 0.44–1.00)
GFR calc Af Amer: >60 mL/min (ref >60)
GFR calc non Af Amer: >60 mL/min (ref >60)
Glucose, Bld: 109 mg/dL — ABNORMAL HIGH (ref 70–99)
Potassium: 5 mmol/L (ref 3.5–5.1)
Sodium: 138 mmol/L (ref 135–145)

## 2020-03-23 LAB — CBC
HCT: 45.5 % (ref 36.0–46.0)
Hemoglobin: 15.2 g/dL — ABNORMAL HIGH (ref 12.0–15.0)
MCH: 29.2 pg (ref 26.0–34.0)
MCHC: 33.4 g/dL (ref 30.0–36.0)
MCV: 87.3 fL (ref 80.0–100.0)
Platelets: 252 10*3/uL (ref 150–400)
RBC: 5.21 MIL/uL — ABNORMAL HIGH (ref 3.87–5.11)
RDW: 15.7 % — ABNORMAL HIGH (ref 11.5–15.5)
WBC: 13.8 10*3/uL — ABNORMAL HIGH (ref 4.0–10.5)
nRBC: 0 % (ref 0.0–0.2)

## 2020-03-23 MED ORDER — CALCIUM CARBONATE ANTACID 500 MG PO CHEW: 1.0000 | CHEWABLE_TABLET | Freq: Two times a day (BID) | ORAL | 0 refills | Status: DC

## 2020-03-23 MED ORDER — LIDOCAINE VISCOUS HCL 2 % MT SOLN
15.0000 mL | Freq: Once | OROMUCOSAL | Status: AC
  Administered 2020-03-23: 15 mL via ORAL
  Filled 2020-03-23: qty 15

## 2020-03-23 MED ORDER — MELATONIN 3 MG PO TABS
3.0000 mg | ORAL_TABLET | Freq: Every day | ORAL | 0 refills | Status: DC
Start: 2020-03-23 — End: 2021-03-24

## 2020-03-23 MED ORDER — ALUM & MAG HYDROXIDE-SIMETH 200-200-20 MG/5ML PO SUSP
30.0000 mL | Freq: Once | ORAL | Status: AC
  Administered 2020-03-23: 30 mL via ORAL
  Filled 2020-03-23: qty 30

## 2020-03-23 NOTE — Progress Notes (Signed)
Discharge instructions discussed with patient. All questions answered. IV removed, telemetry discontinued. Patient transported off unit via wheelchair. Sister arrived for transport home. Melony Overly, RN

## 2020-03-23 NOTE — Progress Notes (Signed)
Patient complaining of chest pain. MD paged. Jacqueline Jones

## 2020-03-23 NOTE — Discharge Summary (Signed)
Physician Discharge Summary  Jacqueline Jones HGD:[id number] DOB: 11/08/1990 DOA: 03/22/2020  PCP: Norm Salt, PA  Admit date: 03/22/2020 Discharge date: 03/23/2020  Recommendations for Outpatient Follow-up:   Discharge to home Follow up with PCP in 7-10 days. Avoid Cannabis  Discharge Diagnoses: Principal diagnosis is #1 1. Cannabis hyperemesis 2. Prolonged QTc 3. Seizure 4. Sinus Tachycardia   Discharge Condition: Fair  Disposition: Home  Diet recommendation: Heart Healthy  There were no vitals filed for this visit.  History of present illness: Jacqueline Jones is a 29 y.o. female with medical history significant of seizure disorder, HTN, IBS, asthma, anxiety/depression, presented with persistent nausea with vomiting, unable to take any p.o. intakes for 2 days and witnessed seizure x1 today.  Patient admitted has been using marijuana for 5 to 31-month on weekly basis, but not remembering when was the last time she used marijuana at this time.  Husband notes me that patient probably used marijuana earlier this week.  For the last 2 days, patient has been having persistent symptoms of feeling nausea frequent vomiting unable to eat any food or large amount of food, she tried to swallow her seizure pills but vomited out immediately after taking them.  She denied any abdominal pain, no diarrhea no fever chills no cough no urinary symptoms.  This morning, patient suddenly become unconscious and seizing for about 1 minute with whole body shaking I both going back and she also bit her tongue on the right side.  Whole episode lasted about 1 minute, with post ictal confusion for about 2 to 3 minutes.  Awake, neurologically intact, EKG showed significant QTC prolongation, WBC 16.  Hospital Course:  The patient was admitted to a tleletry bed. She was given hydration and antiemetics. She has been counseled regarding cannabinoid hyperemesis. She is feeling better this morning and wants to  go home. She states that she has had liquids for breakfast  She will be discharged to home in fair condition. Today's assessment: S: The patient is sitting up in bed. She is crying. I ask her why and she states that she wants to go home. O: Vitals:  Vitals:   03/23/20 0740 03/23/20 0741  BP: (!) 150/103 (!) 150/103  Pulse:  76  Resp:  18  Temp: 98.3 F (36.8 C) 98.3 F (36.8 C)  SpO2: 97% 97%   Exam:  Constitutional:  . The patient is awake, alert, and oriented x 3. No acute distress. Respiratory:  . No increased work of breathing. . No wheezes, rales, or rhonchi . No tactile fremitus Cardiovascular:  . Regular rate and rhythm . No murmurs, ectopy, or gallups. . No lateral PMI. No thrills. Abdomen:  . Abdomen is soft, non-tender, non-distended . No hernias, masses, or organomegaly . Normoactive bowel sounds.  Musculoskeletal:  . No cyanosis, clubbing, or edema Skin:  . No rashes, lesions, ulcers . palpation of skin: no induration or nodules Neurologic:  . CN 2-12 intact . Sensation all 4 extremities intact Psychiatric:  . Mental status o Tearful, but awake, alert, and oriented x 3. She states that she wants to go home. o Orientation to person, place, time  . judgment and insight appear intact  Discharge Instructions  Discharge Instructions    Activity as tolerated - No restrictions   Complete by: As directed    Call MD for:  difficulty breathing, headache or visual disturbances   Complete by: As directed    Call MD for:  persistant dizziness or light-headedness  Complete by: As directed    Call MD for:  persistant nausea and vomiting   Complete by: As directed    Diet - low sodium heart healthy   Complete by: As directed    Discharge instructions   Complete by: As directed    Discharge to home Follow up with PCP in 7-10 days. Avoid Cannabis   Increase activity slowly   Complete by: As directed      Allergies as of 03/23/2020      Reactions    Toradol [ketorolac Tromethamine] Itching   seizure   Clonazepam Other (See Comments)   Seizure   Flexeril [cyclobenzaprine] Other (See Comments)   seizure   Gabapentin Other (See Comments)   seizures   Tramadol Itching   Seizures   Vicodin [hydrocodone-acetaminophen] Itching   Pt states she can take Tylenol without difficulty.   Adhesive [tape] Rash   Azithromycin Rash   Benzonatate Rash   Keflet [cephalexin] Rash   Did it involve swelling of the face/tongue/throat, SOB, or low BP? No Did it involve sudden or severe rash/hives, skin peeling, or any reaction on the inside of your mouth or nose? No Did you need to seek medical attention at a hospital or doctor's office? No When did it last happen? If all above answers are "NO", may proceed with cephalosporin use.   Latex Rash   Levaquin [levofloxacin In D5w] Rash      Medication List    STOP taking these medications   ondansetron 8 MG tablet Commonly known as: Zofran   promethazine 25 MG tablet Commonly known as: PHENERGAN     TAKE these medications   albuterol 108 (90 Base) MCG/ACT inhaler Commonly known as: VENTOLIN HFA Inhale 2 puffs into the lungs every 6 (six) hours as needed for wheezing or shortness of breath.   calcium carbonate 500 MG chewable tablet Commonly known as: TUMS - dosed in mg elemental calcium Chew 1 tablet (200 mg of elemental calcium total) by mouth 2 (two) times daily with a meal.   lamoTRIgine 25 MG tablet Commonly known as: LAMICTAL Take 1 tablet (25 mg total) by mouth 2 (two) times daily.   levETIRAcetam 750 MG tablet Commonly known as: KEPPRA Take 1 tablet (750 mg total) by mouth 2 (two) times daily.   melatonin 3 MG Tabs tablet Take 1 tablet (3 mg total) by mouth at bedtime.   pantoprazole 40 MG tablet Commonly known as: PROTONIX Take 1 tablet (40 mg total) by mouth daily.      Allergies  Allergen Reactions  . Toradol [Ketorolac Tromethamine] Itching    seizure  .  Clonazepam Other (See Comments)    Seizure  . Flexeril [Cyclobenzaprine] Other (See Comments)    seizure  . Gabapentin Other (See Comments)    seizures   . Tramadol Itching    Seizures  . Vicodin [Hydrocodone-Acetaminophen] Itching    Pt states she can take Tylenol without difficulty.  . Adhesive [Tape] Rash  . Azithromycin Rash  . Benzonatate Rash  . Keflet [Cephalexin] Rash    Did it involve swelling of the face/tongue/throat, SOB, or low BP? No Did it involve sudden or severe rash/hives, skin peeling, or any reaction on the inside of your mouth or nose? No Did you need to seek medical attention at a hospital or doctor's office? No When did it last happen? If all above answers are "NO", may proceed with cephalosporin use.   . Latex Rash  . Levaquin [Levofloxacin In  D5w] Rash    The results of significant diagnostics from this hospitalization (including imaging, microbiology, ancillary and laboratory) are listed below for reference.    Significant Diagnostic Studies: DG Chest 1 View  Result Date: 03/22/2020 CLINICAL DATA:  Seizure. EXAM: CHEST  1 VIEW COMPARISON:  November 03, 2019 FINDINGS: The heart size and mediastinal contours are within normal limits. Both lungs are clear. The visualized skeletal structures are unremarkable. IMPRESSION: No active disease. Electronically Signed   By: Gerome Samavid  Williams III M.D   On: 03/22/2020 16:46   CT HEAD WO CONTRAST  Result Date: 03/22/2020 CLINICAL DATA:  Nontraumatic seizure. EXAM: CT HEAD WITHOUT CONTRAST TECHNIQUE: Contiguous axial images were obtained from the base of the skull through the vertex without intravenous contrast. COMPARISON:  CT head 11/03/2019 FINDINGS: Brain: No evidence of acute infarction, hemorrhage, hydrocephalus, extra-axial collection or mass lesion/mass effect. Vascular: No hyperdense vessel or unexpected calcification. Skull: Normal. Negative for fracture or focal lesion. Sinuses/Orbits: No acute finding. Other:  None. IMPRESSION: No acute intracranial pathology. Electronically Signed   By: Emmaline KluverNancy  Ballantyne M.D.   On: 03/22/2020 17:00    Microbiology: Recent Results (from the past 240 hour(s))  SARS Coronavirus 2 by RT PCR (hospital order, performed in [facility] hospital lab) Nasopharyngeal Nasopharyngeal Swab     Status: None   Collection Time: 03/22/20  2:59 PM   Specimen: Nasopharyngeal Swab  Result Value Ref Range Status   SARS Coronavirus 2 NEGATIVE NEGATIVE Final    Comment: (NOTE) SARS-CoV-2 target nucleic acids are NOT DETECTED.  The SARS-CoV-2 RNA is generally detectable in upper and lower respiratory specimens during the acute phase of infection. The lowest concentration of SARS-CoV-2 viral copies this assay can detect is 250 copies / mL. A negative result does not preclude SARS-CoV-2 infection and should not be used as the sole basis for treatment or other patient management decisions.  A negative result may occur with improper specimen collection / handling, submission of specimen other than nasopharyngeal swab, presence of viral mutation(s) within the areas targeted by this assay, and inadequate number of viral copies (<250 copies / mL). A negative result must be combined with clinical observations, patient history, and epidemiological information.  Fact Sheet for Patients:   BoilerBrush.com.cyhttps://www.fda.gov/media/136312/download  Fact Sheet for Healthcare Providers: https://pope.com/https://www.fda.gov/media/136313/download  This test is not yet approved or  cleared by the [place] FDA and has been authorized for detection and/or diagnosis of SARS-CoV-2 by FDA under an Emergency Use Authorization (EUA).  This EUA will remain in effect (meaning this test can be used) for the duration of the COVID-19 declaration under Section 564(b)(1) of the Act, 21 U.S.C. section 360bbb-3(b)(1), unless the authorization is terminated or revoked sooner.  Performed at [facility], [address]  [place], [place] [address]      Labs: Basic Metabolic Panel: Recent Labs  Lab 03/22/20 1426 03/23/20 0421  NA 140 138  K 2.9* 5.0  CL 102 108  CO2 22 19*  GLUCOSE 125* 109*  BUN 8 <5*  CREATININE 0.71 0.66  CALCIUM 9.9 8.6*  MG 2.5*  --   PHOS 2.1*  --    Liver Function Tests: Recent Labs  Lab 03/22/20 1426  AST 59*  ALT 57*  ALKPHOS 88  BILITOT 1.2  PROT 8.0  ALBUMIN 4.3   Recent Labs  Lab 03/22/20 1219  LIPASE 22   No results for input(s): AMMONIA in the last 168 hours. CBC: Recent Labs  Lab 03/22/20 1218 03/23/20  0146  WBC 16.4* 13.8*  NEUTROABS 12.9*  --   HGB 15.8* 15.2*  HCT 46.4* 45.5  MCV 85.3 87.3  PLT 240 252   Cardiac Enzymes: No results for input(s): CKTOTAL, CKMB, CKMBINDEX, TROPONINI in the last 168 hours. BNP: BNP (last 3 results) No results for input(s): BNP in the last 8760 hours.  ProBNP (last 3 results) No results for input(s): PROBNP in the last 8760 hours.  CBG: Recent Labs  Lab 03/22/20 1228  GLUCAP 165*    Active Problems:   Seizure (HCC)  Time coordinating discharge: 38 minutes.  Signed:        Deryl Giroux, DO [facility]  03/23/2020, 5:08 PM

## 2020-03-24 LAB — I-STAT BETA HCG BLOOD, ED (NOT ORDERABLE): I-stat hCG, quantitative: <5 m[IU]/mL (ref <5)

## 2020-06-17 ENCOUNTER — Telehealth: Payer: Medicaid Other | Admitting: Emergency Medicine

## 2020-06-17 DIAGNOSIS — R059 Cough, unspecified: Secondary | ICD-10-CM | POA: Diagnosis not present

## 2020-06-17 MED ORDER — DOXYCYCLINE HYCLATE 100 MG PO TABS: 100.0000 mg | ORAL_TABLET | Freq: Two times a day (BID) | ORAL | 0 refills | Status: DC

## 2020-06-17 MED ORDER — PREDNISONE 10 MG PO TABS: 10.0000 mg | ORAL_TABLET | Freq: Every day | ORAL | 0 refills | Status: AC

## 2020-06-17 MED ORDER — GUAIFENESIN 100 MG/5ML PO LIQD: 100.0000 mg | ORAL | 0 refills | Status: DC | PRN

## 2020-06-17 MED ORDER — ALBUTEROL SULFATE HFA 108 (90 BASE) MCG/ACT IN AERS: 2.0000 | INHALATION_SPRAY | Freq: Four times a day (QID) | RESPIRATORY_TRACT | 0 refills | Status: DC | PRN

## 2020-06-17 NOTE — Addendum Note (Signed)
Addended by: Liberty Handy on: 06/17/2020 10:57 AM   Modules accepted: Orders

## 2020-06-17 NOTE — Progress Notes (Signed)
Time spent: 10 min  We are sorry that you are not feeling well.  Here is how we plan to help!  Based on your presentation I believe you most likely have A cough due to bacteria.  When patients have a fever and a productive cough with a change in color or increased sputum production, we are concerned about bacterial bronchitis.  If left untreated it can progress to pneumonia.  If your symptoms do not improve with your treatment plan it is important that you contact your provider.   I have prescribed Doxycycline 100 mg twice a day for 7 days    If you are not fully vaccinated for COVID I strongly recommend you get tested to exclude this.   In addition you may use A prescription cough medication called Tessalon Perles 100mg . You may take 1-2 capsules every 8 hours as needed for your cough.  Addendum - will defer tessalon perles given documented history of rash with this. Will recommend OTC anti tussive and albuterol  Prednisone 10 mg daily for 6 days (see taper instructions below)  From your responses in the eVisit questionnaire you describe inflammation in the upper respiratory tract which is causing a significant cough.  This is commonly called Bronchitis and has four common causes:    Allergies  Viral Infections  Acid Reflux  Bacterial Infection Allergies, viruses and acid reflux are treated by controlling symptoms or eliminating the cause. An example might be a cough caused by taking certain blood pressure medications. You stop the cough by changing the medication. Another example might be a cough caused by acid reflux. Controlling the reflux helps control the cough.  USE OF BRONCHODILATOR ("RESCUE") INHALERS: There is a risk from using your bronchodilator too frequently.  The risk is that over-reliance on a medication which only relaxes the muscles surrounding the breathing tubes can reduce the effectiveness of medications prescribed to reduce swelling and congestion of the tubes  themselves.  Although you feel brief relief from the bronchodilator inhaler, your asthma may actually be worsening with the tubes becoming more swollen and filled with mucus.  This can delay other crucial treatments, such as oral steroid medications. If you need to use a bronchodilator inhaler daily, several times per day, you should discuss this with your provider.  There are probably better treatments that could be used to keep your asthma under control.     HOME CARE . Only take medications as instructed by your medical team. . Complete the entire course of an antibiotic. . Drink plenty of fluids and get plenty of rest. . Avoid close contacts especially the very young and the elderly . Cover your mouth if you cough or cough into your sleeve. . Always remember to wash your hands . A steam or ultrasonic humidifier can help congestion.   GET HELP RIGHT AWAY IF: . You develop worsening fever. . You become short of breath . You cough up blood. . Your symptoms persist after you have completed your treatment plan MAKE SURE YOU   Understand these instructions.  Will watch your condition.  Will get help right away if you are not doing well or get worse.  Your e-visit answers were reviewed by a board certified advanced clinical practitioner to complete your personal care plan.  Depending on the condition, your plan could have included both over the counter or prescription medications. If there is a problem please reply  once you have received a response from your provider. Your safety is  important to [place].  If you have drug allergies check your prescription carefully.    You can use MyChart to ask questions about today's visit, request a non-urgent call back, or ask for a work or school excuse for 24 hours related to this e-Visit. If it has been greater than 24 hours you will need to follow up with your provider, or enter a new e-Visit to address those concerns. You will get an e-mail in the next  two days asking about your experience.  I hope that your e-visit has been valuable and will speed your recovery. Thank you for using e-visits.

## 2020-07-06 ENCOUNTER — Other Ambulatory Visit: Payer: Self-pay

## 2020-07-06 ENCOUNTER — Emergency Department (HOSPITAL_COMMUNITY)
Admission: EM | Admit: 2020-07-06 | Discharge: 2020-07-07 | Disposition: A | Discharge status: Home / Self Care | Payer: Medicaid Other | Attending: Emergency Medicine | Admitting: Emergency Medicine

## 2020-07-06 DIAGNOSIS — R4701 Aphasia: Secondary | ICD-10-CM | POA: Insufficient documentation

## 2020-07-06 DIAGNOSIS — Z91040 Latex allergy status: Secondary | ICD-10-CM | POA: Diagnosis not present

## 2020-07-06 DIAGNOSIS — Z87891 Personal history of nicotine dependence: Secondary | ICD-10-CM | POA: Insufficient documentation

## 2020-07-06 DIAGNOSIS — J45909 Unspecified asthma, uncomplicated: Secondary | ICD-10-CM | POA: Insufficient documentation

## 2020-07-06 DIAGNOSIS — R569 Unspecified convulsions: Secondary | ICD-10-CM | POA: Diagnosis not present

## 2020-07-06 LAB — CBG MONITORING, ED: Glucose-Capillary: 128 mg/dL — ABNORMAL HIGH (ref 70–99)

## 2020-07-06 NOTE — ED Triage Notes (Signed)
Patient arrived with husband who states he was face timing the patient and she tilted her head back, states family with patient reports her having a seizure then having another 30 minutes later. Declines any loss of bowel or bladder. Husband at bedside stating she is disoriented and cannot answer questions. Patient able to tell this nurse name and date of birth but slow to answer.

## 2020-07-06 NOTE — ED Notes (Signed)
Labs drawn on pt. Sent to lab dk. Green, lavender and lt. green

## 2020-07-07 ENCOUNTER — Emergency Department (HOSPITAL_COMMUNITY): Payer: Medicaid Other

## 2020-07-07 LAB — CBC WITH DIFFERENTIAL/PLATELET
Abs Immature Granulocytes: 0.03 10*3/uL (ref 0.00–0.07)
Basophils Absolute: 0.1 10*3/uL (ref 0.0–0.1)
Basophils Relative: 1 %
Eosinophils Absolute: 0 10*3/uL (ref 0.0–0.5)
Eosinophils Relative: 0 %
HCT: 43.2 % (ref 36.0–46.0)
Hemoglobin: 14.4 g/dL (ref 12.0–15.0)
Immature Granulocytes: 0 %
Lymphocytes Relative: 14 %
Lymphs Abs: 1.6 10*3/uL (ref 0.7–4.0)
MCH: 29.8 pg (ref 26.0–34.0)
MCHC: 33.3 g/dL (ref 30.0–36.0)
MCV: 89.4 fL (ref 80.0–100.0)
Monocytes Absolute: 0.5 10*3/uL (ref 0.1–1.0)
Monocytes Relative: 4 %
Neutro Abs: 9.7 10*3/uL — ABNORMAL HIGH (ref 1.7–7.7)
Neutrophils Relative %: 81 %
Platelets: 264 10*3/uL (ref 150–400)
RBC: 4.83 MIL/uL (ref 3.87–5.11)
RDW: 15.3 % (ref 11.5–15.5)
WBC: 12 10*3/uL — ABNORMAL HIGH (ref 4.0–10.5)
nRBC: 0 % (ref 0.0–0.2)

## 2020-07-07 LAB — COMPREHENSIVE METABOLIC PANEL
ALT: 27 U/L (ref 0–44)
AST: 25 U/L (ref 15–41)
Albumin: 4.2 g/dL (ref 3.5–5.0)
Alkaline Phosphatase: 69 U/L (ref 38–126)
Anion gap: 10 (ref 5–15)
BUN: 9 mg/dL (ref 6–20)
CO2: 20 mmol/L — ABNORMAL LOW (ref 22–32)
Calcium: 9 mg/dL (ref 8.9–10.3)
Chloride: 108 mmol/L (ref 98–111)
Creatinine, Ser: 0.72 mg/dL (ref 0.44–1.00)
GFR, Estimated: >60 mL/min (ref >60)
Glucose, Bld: 122 mg/dL — ABNORMAL HIGH (ref 70–99)
Potassium: 4.1 mmol/L (ref 3.5–5.1)
Sodium: 138 mmol/L (ref 135–145)
Total Bilirubin: 0.3 mg/dL (ref 0.3–1.2)
Total Protein: 7.9 g/dL (ref 6.5–8.1)

## 2020-07-07 LAB — CBG MONITORING, ED: Glucose-Capillary: 122 mg/dL — ABNORMAL HIGH (ref 70–99)

## 2020-07-07 LAB — LACTIC ACID, PLASMA: Lactic Acid, Venous: 1.7 mmol/L (ref 0.5–1.9)

## 2020-07-07 MED ORDER — LORAZEPAM 2 MG/ML IJ SOLN
1.0000 mg | Freq: Once | INTRAMUSCULAR | Status: AC
  Administered 2020-07-07: 1 mg via INTRAVENOUS
  Filled 2020-07-07: qty 1

## 2020-07-07 MED ORDER — VALPROIC ACID 250 MG PO CAPS
1000.0000 mg | ORAL_CAPSULE | Freq: Once | ORAL | Status: AC
  Administered 2020-07-07: 1000 mg via ORAL
  Filled 2020-07-07: qty 4

## 2020-07-07 MED ORDER — LEVETIRACETAM IN NACL 1000 MG/100ML IV SOLN
1000.0000 mg | Freq: Once | INTRAVENOUS | Status: AC
  Administered 2020-07-07: 1000 mg via INTRAVENOUS
  Filled 2020-07-07: qty 100

## 2020-07-07 MED ORDER — SODIUM CHLORIDE (PF) 0.9 % IJ SOLN
INTRAMUSCULAR | Status: AC
  Filled 2020-07-07: qty 50

## 2020-07-07 MED ORDER — LAMOTRIGINE 25 MG PO TABS
25.0000 mg | ORAL_TABLET | Freq: Two times a day (BID) | ORAL | Status: DC
  Administered 2020-07-07: 25 mg via ORAL
  Filled 2020-07-07: qty 1

## 2020-07-07 MED ORDER — IOHEXOL 350 MG/ML SOLN
100.0000 mL | Freq: Once | INTRAVENOUS | Status: AC | PRN
  Administered 2020-07-07: 100 mL via INTRAVENOUS

## 2020-07-07 NOTE — ED Provider Notes (Signed)
Hx of seizures, depression, PTSD  Between 12PM yesterday Staring into space, not answering for husband-- went over and her glasses off like she had a seizure. Later that day she had a seizure (was trying to get to her the first time)  Then was ok, answering questions 8PM video chatting again, had another presumed seizure.  30-40 minutes she had another one, shaking all over, bit tongue the second time.    Guessing she had 3 seizures yesterday, between 8-9PM Sister was there  A few weeks ago had one as well   [facility] current Neurologist Lamictal, keppra increased a few months ago   Husband lives around the corner with mom, she lives with her sister Physical Exam  BP (!) 142/107   Pulse 78   Temp 98.4 F (36.9 C) (Oral)   Resp 20   Ht 5' (1.524 m)   Wt 86.2 kg   SpO2 94%   BMI 37.11 kg/m   Physical Exam  ED Course/Procedures     Procedures  MDM     Received care from Dr. Preston Fleeting.  Please see his note for prior history, physical and care.  Briefly this is a 29 year old female with a history of seizures and pseudoseizures who presents with concern for 3 seizures yesterday with patient not speaking overnight.  Ordered CTA head and neck to evaluate for signs of thrombosis or abnormalities in setting of patient not talking, postictal vs aphasia vs psychiatric.    CT without abnormalities. On my reevaluation she is speaking more-when asked, reports she feels "ok" but when I ask if she is back to herself she says "No, I miss my husband." She is tearful and will not explain more. She does not appear aphasic-when asked questions she either answers or does not attempt to answer and is tearful. Initially discussed with Neurology and agree to obtaining EEG for further evaluation.   Discussed with husband who reports they have been living apart and she has been with her sister and that this has been stressful for both parties.  Patient, sister and husband all deny concerns for  SI.  They do note she has a history of depression and has been upset.  On my reevaluation of patient she is talking more normally, able to repeat back words, is back to baseline, appropriate.  I do not see signs of nonconvulsive status epilepticus.  Discussed with patient we could continue with plan for EEG but at this time there are not signs of continuing seizure. She agrees with discharge home.  Discussed that stress can both increase pseudoseizures and lack of sleep due to stress can exacerbate underlying seizure disorder and both of these are possibilities. Sent lamictal and keppra levels and recommend follow up with her Neurologist regarding medication changes and recommend good sleep hygiene and stress reduction.      Alvira Monday, MD 07/08/20 [phone]

## 2020-07-07 NOTE — ED Notes (Addendum)
Pt's husband called to facilitate a ride home for her. Husband sts he will call pt's sister to come get her "soon."

## 2020-07-07 NOTE — ED Notes (Signed)
Pt discharged from this ED in stable condition at this time. All discharge instructions and follow up care reviewed with pt with no further questions at this time. Pt ambulatory with steady gait, clear speech.  

## 2020-07-07 NOTE — ED Provider Notes (Signed)
[facility]-EMERGENCY DEPT Provider Note   CSN: [id number] Arrival date & time: 07/06/20  2247   History Chief Complaint  Patient presents with   Seizures    Jacqueline Jones is a 29 y.o. female.  The history is provided by the patient and the spouse. The history is limited by the condition of the patient (Patient is nonverbal).  Seizures She has history of seizure disorder, depression, hyperlipidemia, prolonged QT interval, substance abuse and is brought in to the emergency department by her husband who noted that she had a seizure while he was on FaceTime with her, and another one about 30 minutes later.  Patient is now awake and alert and will nod her head but will not answer any questions beyond nodding her head.  She denies missing any doses of her medications but does not answer when asked about recent drug use.  Husband states that she did not have loss of bowel or bladder control.  Past Medical History:  Diagnosis Date   Anemia    Anxiety    Anxiety    Asthma    Depression    Depression    Elevated cholesterol    Frequent headaches    High blood pressure    IBS (irritable bowel syndrome)    Infection    UTI   Obesity    Pneumonia    Seizures (HCC)    unknown cause- started 08/14  "monthly"   Sepsis (HCC)    UTI (lower urinary tract infection)     Patient Active Problem List   Diagnosis Date Noted   Hypokalemia    Non-intractable vomiting    QT prolongation    Overdose of sedative or hypnotic 11/06/2019   Major depressive disorder, recurrent episode, severe (HCC) 11/04/2019   Seizure (HCC) 11/03/2019   Medical non-compliance 05/13/2014   Neurologic abnormality 05/13/2014   Folliculitis 05/13/2014   UTI (lower urinary tract infection) 02/18/2014   Pupil asymmetry 02/05/2014   Polysubstance dependence (HCC) 02/28/2012   GAD (generalized anxiety disorder) 09/02/2011    Past Surgical History:  Procedure  Laterality Date   COLONOSCOPY     age [age]   DIAGNOSTIC LAPAROSCOPY       OB History    Gravida  0   Para  0   Term      Preterm      AB      Living        SAB      IAB      Ectopic      Multiple      Live Births              Family History  Problem Relation Age of Onset   Drug abuse Father    Cancer Father        age orange   Lung cancer Father    Depression Mother    Anxiety disorder Mother    Drug abuse Brother        incarcirated    Colon cancer Neg Hx     Social History   Tobacco Use   Smoking status: Former Smoker    Packs/day: 0.25    Years: 10.00    Pack years: 2.50    Types: Cigarettes   Smokeless tobacco: Former Neurosurgeon   Tobacco comment: she is wanting to quit trying  Substance Use Topics   Alcohol use: No   Drug use: Not Currently    Frequency: 7.0  times per week    Home Medications Prior to Admission medications   Medication Sig Start Date End Date Taking? Authorizing Provider  albuterol (VENTOLIN HFA) 108 (90 Base) MCG/ACT inhaler Inhale 2 puffs into the lungs every 6 (six) hours as needed for wheezing or shortness of breath. 11/06/19   Clapacs, Jackquline Denmark, MD  albuterol (VENTOLIN HFA) 108 (90 Base) MCG/ACT inhaler Inhale 2 puffs into the lungs every 6 (six) hours as needed for wheezing or shortness of breath. 06/17/20   Liberty Handy, PA-C  calcium carbonate (TUMS - DOSED IN MG ELEMENTAL CALCIUM) 500 MG chewable tablet Chew 1 tablet (200 mg of elemental calcium total) by mouth 2 (two) times daily with a meal. 03/23/20   Swayze, Ava, DO  doxycycline (VIBRA-TABS) 100 MG tablet Take 1 tablet (100 mg total) by mouth 2 (two) times daily. 06/17/20   Liberty Handy, PA-C  guaiFENesin (ROBITUSSIN) 100 MG/5ML liquid Take 5-10 mLs (100-200 mg total) by mouth every 4 (four) hours as needed for cough. FOR CHEST CONGESTION AND PHLEGM 06/17/20   Liberty Handy, PA-C  lamoTRIgine (LAMICTAL) 25 MG tablet Take 1  tablet (25 mg total) by mouth 2 (two) times daily. 11/06/19   Clapacs, Jackquline Denmark, MD  levETIRAcetam (KEPPRA) 750 MG tablet Take 1 tablet (750 mg total) by mouth 2 (two) times daily. 11/06/19   Clapacs, Jackquline Denmark, MD  melatonin 3 MG TABS tablet Take 1 tablet (3 mg total) by mouth at bedtime. 03/23/20   Swayze, Ava, DO  pantoprazole (PROTONIX) 40 MG tablet Take 1 tablet (40 mg total) by mouth daily. 01/03/20   Meredith Pel, NP    Allergies    Toradol [ketorolac tromethamine], Clonazepam, Flexeril [cyclobenzaprine], Gabapentin, Tramadol, Vicodin [hydrocodone-acetaminophen], Adhesive [tape], Azithromycin, Benzonatate, Keflet [cephalexin], Latex, and Levaquin [levofloxacin in d5w]  Review of Systems   Review of Systems  Unable to perform ROS: Patient nonverbal  Neurological: Positive for seizures.    Physical Exam Updated Vital Signs BP (!) 133/100 (BP Location: Right Arm)    Pulse (!) 111    Temp 98.4 F (36.9 C) (Oral)    Resp 18    Ht 5' (1.524 m)    Wt 86.2 kg    SpO2 93%    BMI 37.11 kg/m   Physical Exam Vitals and nursing note reviewed.   29 year old female, resting comfortably and in no acute distress. Vital signs are significant for elevated blood pressure and heart rate. Oxygen saturation is 93%, which is normal. Head is normocephalic and atraumatic. PERRLA, EOMI. Oropharynx is clear. Neck is nontender and supple without adenopathy or JVD. Back is nontender and there is no CVA tenderness. Lungs are clear without rales, wheezes, or rhonchi. Chest is nontender. Heart has regular rate and rhythm without murmur. Abdomen is soft, flat, nontender without masses or hepatosplenomegaly and peristalsis is normoactive. Extremities have no cyanosis or edema, full range of motion is present. Skin is warm and dry without rash. Neurologic: Awake and alert but nonverbal, will nod her head yes or no to answer questions, follows commands well, cranial nerves are intact, moves all extremities  equally.  ED Results / Procedures / Treatments   Labs (all labs ordered are listed, but only abnormal results are displayed) Labs Reviewed  COMPREHENSIVE METABOLIC PANEL - Abnormal; Notable for the following components:      Result Value   CO2 20 (*)    Glucose, Bld 122 (*)    All other components within normal  limits  CBC WITH DIFFERENTIAL/PLATELET - Abnormal; Notable for the following components:   WBC 12.0 (*)    Neutro Abs 9.7 (*)    All other components within normal limits  CBG MONITORING, ED - Abnormal; Notable for the following components:   Glucose-Capillary 128 (*)    All other components within normal limits  CBG MONITORING, ED - Abnormal; Notable for the following components:   Glucose-Capillary 122 (*)    All other components within normal limits  LACTIC ACID, PLASMA    EKG EKG Interpretation  Date/Time:  Monday July 07 2020 03:54:00 EST Ventricular Rate:  82 PR Interval:    QRS Duration: 81 QT Interval:  374 QTC Calculation: 437 R Axis:   33 Text Interpretation: Sinus rhythm Normal ECG When compared with ECG of 03/23/2020, QT has shortened Nonspecific T wave abnormality is no longer present Confirmed by Dione Booze ([id number]) on 07/07/2020 4:06:17 AM   Radiology No results found.  Procedures Procedures  Medications Ordered in ED Medications  valproic acid (DEPAKENE) 250 MG capsule 1,000 mg (has no administration in time range)  lamoTRIgine (LAMICTAL) tablet 25 mg (has no administration in time range)  LORazepam (ATIVAN) injection 1 mg (has no administration in time range)  LORazepam (ATIVAN) injection 1 mg (1 mg Intravenous Given 07/07/20 0255)  levETIRAcetam (KEPPRA) IVPB 1000 mg/100 mL premix (0 mg Intravenous Stopped 07/07/20 0545)    ED Course  I have reviewed the triage vital signs and the nursing notes.  Pertinent lab results that were available during my care of the patient were reviewed by me and considered in my medical decision making  (see chart for details).  MDM Rules/Calculators/A&P Breakthrough seizure.  Patient's anticonvulsants are lamotrigine and levetiracetam, neither which are amenable to testing for blood levels in the emergency department.  Old records are reviewed, and she has relatively few hospital encounters for her seizures.  Will check screening labs and observe in the emergency department.  1:25 AM Labs are reassuring. Patient's husband has arrived and states that, as far as he knows, she has not missed any doses of her anticonvulsants. She is still not speaking. She will need to be observed in the ED until she is back to her baseline mental status.  3:45 AM She continues to be nonverbal.  Her husband states that this is clearly not her usual mental state.  She was given a dose of lorazepam with no change.  Will check ECG to evaluate QT interval and proceed with additional levetiracetam if QT interval is not severely prolonged.  4:06 AM ECG shows normal QT interval, levetiracetam is ordered.  6:45 AM Patient is still mute following levetiracetam.  At this point, the question is whether her refusal to speak is a psychiatric issue versus a prolonged postictal's event versus ongoing seizure.  I discussed case with Dr. Thomasena Edis of neurology service at [facility] who feels that ongoing seizure with the only symptom being patient refusing to speak is very unlikely but recommends giving a trial of valproic acid and if she fails to respond to that proceed with TTS evaluation.  8:04 AM Case is signed out to Dr. Dalene Seltzer.  Final Clinical Impression(s) / ED Diagnoses Final diagnoses:  Seizure [facility])  Aphasia    Rx / DC Orders ED Discharge Orders    None       Dione Booze, MD 07/07/20 [phone]

## 2020-07-07 NOTE — ED Notes (Signed)
Provider notified regarding pt's elevated HR and BP. Awaiting feedback / new orders.

## 2020-07-08 LAB — LAMOTRIGINE LEVEL: Lamotrigine Lvl: 1.3 ug/mL — ABNORMAL LOW (ref 2.0–20.0)

## 2020-07-10 LAB — LEVETIRACETAM LEVEL: Levetiracetam Lvl: 14.7 ug/mL (ref 10.0–40.0)

## 2020-09-15 ENCOUNTER — Telehealth: Payer: Medicaid Other | Admitting: Nurse Practitioner

## 2020-09-15 ENCOUNTER — Other Ambulatory Visit: Payer: Self-pay

## 2020-09-15 ENCOUNTER — Ambulatory Visit
Admission: EM | Admit: 2020-09-15 | Discharge: 2020-09-15 | Disposition: A | Discharge status: Home / Self Care | Payer: Medicaid Other | Attending: Physician Assistant | Admitting: Physician Assistant

## 2020-09-15 DIAGNOSIS — N3001 Acute cystitis with hematuria: Secondary | ICD-10-CM | POA: Insufficient documentation

## 2020-09-15 LAB — POCT URINALYSIS DIP (MANUAL ENTRY)
Bilirubin, UA: NEGATIVE
Glucose, UA: NEGATIVE mg/dL
Ketones, POC UA: NEGATIVE mg/dL
Nitrite, UA: POSITIVE — AB
Protein Ur, POC: NEGATIVE mg/dL
Spec Grav, UA: 1.025 (ref 1.010–1.025)
Urobilinogen, UA: 1 E.U./dL
pH, UA: 7 (ref 5.0–8.0)

## 2020-09-15 LAB — POCT URINE PREGNANCY: Preg Test, Ur: NEGATIVE

## 2020-09-15 MED ORDER — BACLOFEN 10 MG PO TABS: 10.0000 mg | ORAL_TABLET | Freq: Three times a day (TID) | ORAL | 0 refills | Status: DC

## 2020-09-15 MED ORDER — NITROFURANTOIN MONOHYD MACRO 100 MG PO CAPS: 100.0000 mg | ORAL_CAPSULE | Freq: Two times a day (BID) | ORAL | 0 refills | Status: AC

## 2020-09-15 NOTE — Progress Notes (Signed)
Based on what you shared with me it looks like you have *uti with hamaturia,that should be evaluated in a face to face office visit. You will just have to contact your PCP OFFICE AND LET THEM KNOW WHAT IS GOING ON.  Many offices offer virtual options to be seen via video if you are not comfortable going in person to a medical facility at this time.  If you do not have a PCP, [facility] offers a free physician referral service available at [phone]. Our trained staff has the experience, knowledge and resources to put you in touch with a physician who is right for you.   You also have the option of a video visit through [email or web address]  If you are having a true medical emergency please call 911.  NOTE: If you entered your credit card information for this eVisit, you will not be charged. You may see a "hold" on your card for the $35 but that hold will drop off and you will not have a charge processed.  Your e-visit answers were reviewed by a board certified advanced clinical practitioner to complete your personal care plan.  Thank you for using e-Visits.

## 2020-09-15 NOTE — Discharge Instructions (Addendum)
Given your degree of pain would suggest you go to the ED for further evaluation and imaging. Will go ahead and treat your infection with Macrobid. Baclofen is given for muscle spams only to use sparingly.

## 2020-09-15 NOTE — ED Triage Notes (Signed)
Patient complains of bilateral flank pain x 2 days and lower back spasms that have worsened. Pt states she has back spasms periodically as well as a hx of uti's and stones. Pt is aox4 and ambulatory.

## 2020-09-15 NOTE — ED Provider Notes (Signed)
[facility]    CSN: [id number] Arrival date & time: 09/15/20  1140      History   Chief Complaint Chief Complaint  Patient presents with  . Flank Pain    X 2 days    HPI Jacqueline Jones is a 30 y.o. female.   Patient presents with a 2 day history of bilateral flank pain, dysuria and hematuria. Known history of kidney stones in the past and history of back "spasms". She states her pain is 10/10. No fever or chills. No nausea or vomiting is noted. Pain with ROM due to the spasms. See remainder of ROS     Past Medical History:  Diagnosis Date  . Anemia   . Anxiety   . Anxiety   . Asthma   . Depression   . Depression   . Elevated cholesterol   . Frequent headaches   . High blood pressure   . IBS (irritable bowel syndrome)   . Infection    UTI  . Obesity   . Pneumonia   . Seizures (HCC)    unknown cause- started 08/14  "monthly"  . Sepsis (HCC)   . UTI (lower urinary tract infection)     Patient Active Problem List   Diagnosis Date Noted  . Hypokalemia   . Non-intractable vomiting   . QT prolongation   . Overdose of sedative or hypnotic 11/06/2019  . Major depressive disorder, recurrent episode, severe (HCC) 11/04/2019  . Seizure (HCC) 11/03/2019  . Medical non-compliance 05/13/2014  . Neurologic abnormality 05/13/2014  . Folliculitis 05/13/2014  . UTI (lower urinary tract infection) 02/18/2014  . Pupil asymmetry 02/05/2014  . Polysubstance dependence (HCC) 02/28/2012  . GAD (generalized anxiety disorder) 09/02/2011    Past Surgical History:  Procedure Laterality Date  . COLONOSCOPY     age [age]  . DIAGNOSTIC LAPAROSCOPY      OB History    Gravida  0   Para  0   Term      Preterm      AB      Living        SAB      IAB      Ectopic      Multiple      Live Births               Home Medications    Prior to Admission medications   Medication Sig Start Date End Date Taking? Authorizing Provider   albuterol (VENTOLIN HFA) 108 (90 Base) MCG/ACT inhaler Inhale 2 puffs into the lungs every 6 (six) hours as needed for wheezing or shortness of breath. 11/06/19   Clapacs, Jackquline Denmark, MD  albuterol (VENTOLIN HFA) 108 (90 Base) MCG/ACT inhaler Inhale 2 puffs into the lungs every 6 (six) hours as needed for wheezing or shortness of breath. Patient not taking: Reported on 07/07/2020 06/17/20   Liberty Handy, PA-C  calcium carbonate (TUMS - DOSED IN MG ELEMENTAL CALCIUM) 500 MG chewable tablet Chew 1 tablet (200 mg of elemental calcium total) by mouth 2 (two) times daily with a meal. 03/23/20   Swayze, Ava, DO  doxycycline (VIBRA-TABS) 100 MG tablet Take 1 tablet (100 mg total) by mouth 2 (two) times daily. Patient not taking: Reported on 07/07/2020 06/17/20   Liberty Handy, PA-C  guaiFENesin (ROBITUSSIN) 100 MG/5ML liquid Take 5-10 mLs (100-200 mg total) by mouth every 4 (four) hours as needed for cough. FOR CHEST CONGESTION AND PHLEGM Patient not  taking: Reported on 07/07/2020 06/17/20   Liberty Handy, PA-C  hydrOXYzine (ATARAX/VISTARIL) 50 MG tablet Take 50 mg by mouth 3 (three) times daily. 06/13/20   [provider]  lamoTRIgine (LAMICTAL) 100 MG tablet Take 100 mg by mouth 2 (two) times daily. 06/13/20   [provider]  lamoTRIgine (LAMICTAL) 25 MG tablet Take 1 tablet (25 mg total) by mouth 2 (two) times daily. Patient not taking: No sig reported 11/06/19   Clapacs, Jackquline Denmark, MD  levETIRAcetam (KEPPRA) 750 MG tablet Take 1 tablet (750 mg total) by mouth 2 (two) times daily. 11/06/19   Clapacs, Jackquline Denmark, MD  melatonin 3 MG TABS tablet Take 1 tablet (3 mg total) by mouth at bedtime. 03/23/20   Swayze, Ava, DO  pantoprazole (PROTONIX) 40 MG tablet Take 1 tablet (40 mg total) by mouth daily. Patient not taking: Reported on 07/07/2020 01/03/20   Meredith Pel, NP  propranolol (INDERAL) 20 MG tablet Take 20 mg by mouth 3 (three) times daily. 06/10/20   [provider]   sertraline (ZOLOFT) 100 MG tablet Take 100 mg by mouth daily. 06/13/20   [provider]  traZODone (DESYREL) 100 MG tablet Take 100 mg by mouth at bedtime. 06/13/20   [provider]  TRI-ESTARYLLA 0.18/0.215/0.25 MG-35 MCG tablet Take 1 tablet by mouth daily. 06/10/20   [provider]    Family History Family History  Problem Relation Age of Onset  . Drug abuse Father   . Cancer Father        age orange  . Lung cancer Father   . Depression Mother   . Anxiety disorder Mother   . Drug abuse Brother        incarcirated   . Colon cancer Neg Hx     Social History Social History   Tobacco Use  . Smoking status: Former Smoker    Packs/day: 0.25    Years: 10.00    Pack years: 2.50    Types: Cigarettes  . Smokeless tobacco: Former Neurosurgeon  . Tobacco comment: she is wanting to quit trying  Vaping Use  . Vaping Use: Never used  Substance Use Topics  . Alcohol use: No  . Drug use: Not Currently    Frequency: 7.0 times per week     Allergies   Toradol [ketorolac tromethamine], Clonazepam, Flexeril [cyclobenzaprine], Gabapentin, Tramadol, Vicodin [hydrocodone-acetaminophen], Adhesive [tape], Azithromycin, Benzonatate, Keflet [cephalexin], Latex, and Levaquin [levofloxacin in d5w]   Review of Systems Review of Systems  Constitutional: Negative for fatigue and fever.  Respiratory: Negative for shortness of breath.   Gastrointestinal: Negative for abdominal distention, abdominal pain, nausea and vomiting.  Genitourinary: Positive for dysuria, flank pain, hematuria and urgency. Negative for vaginal bleeding and vaginal discharge.  Musculoskeletal: Positive for back pain and myalgias.  Skin: Negative for rash.     Physical Exam Triage Vital Signs ED Triage Vitals [09/15/20 1205]  Enc Vitals Group     BP      Pulse      Resp      Temp      Temp src      SpO2      Weight      Height      Head Circumference      Peak Flow      Pain Score 10      Pain Loc      Pain Edu?      Excl. in GC?    No data  found.  Updated Vital Signs There were no vitals taken for this visit.  Visual Acuity Right Eye Distance:   Left Eye Distance:   Bilateral Distance:    Right Eye Near:   Left Eye Near:    Bilateral Near:     Physical Exam Vitals and nursing note reviewed.  Constitutional:      General: She is not in acute distress.    Appearance: Normal appearance. She is not toxic-appearing.     Comments: Patient rocking back and forth on exam table  HENT:     Head: Normocephalic and atraumatic.  Cardiovascular:     Rate and Rhythm: Normal rate and regular rhythm.  Pulmonary:     Effort: Pulmonary effort is normal.     Breath sounds: Normal breath sounds.  Abdominal:     General: Abdomen is flat. There is no distension.     Palpations: There is no mass.     Tenderness: There is no abdominal tenderness. There is right CVA tenderness and left CVA tenderness. There is no guarding or rebound.     Hernia: No hernia is present.     Comments: Pain to percussion of bilateral flank region  Skin:    General: Skin is warm and dry.     Findings: No rash.  Neurological:     Mental Status: She is alert.  Psychiatric:        Mood and Affect: Mood normal.        Behavior: Behavior normal.      UC Treatments / Results  Labs (all labs ordered are listed, but only abnormal results are displayed) Labs Reviewed  POCT URINALYSIS DIP (MANUAL ENTRY) - Abnormal; Notable for the following components:      Result Value   Clarity, UA cloudy (*)    Blood, UA large (*)    Nitrite, UA Positive (*)    Leukocytes, UA Trace (*)    All other components within normal limits  POCT URINE PREGNANCY    EKG   Radiology No results found.  Procedures Procedures (including critical care time)  Medications Ordered in UC Medications - No data to display  Initial Impression / Assessment and Plan / UC Course  I have reviewed the triage vital signs  and the nursing notes.  Pertinent labs & imaging results that were available during my care of the patient were reviewed by me and considered in my medical decision making (see chart for details).     Treat for possible UTI, consider kidney stones, given degree of pain, recommended she present to the ED for further work up with a CT scan, but she refused. She requested Baclofen for muscle spasms, 10 were given only. FU in the ED if worsens. Will culture urine as well.  Final Clinical Impressions(s) / UC Diagnoses   Final diagnoses:  None   Discharge Instructions   None    ED Prescriptions    None     PDMP not reviewed this encounter.   Riki Sheer, [place] 09/15/20 [phone]

## 2020-09-17 ENCOUNTER — Ambulatory Visit: Payer: Medicaid Other | Admitting: Nurse Practitioner

## 2020-09-17 LAB — URINE CULTURE
Culture: >=100000 — AB
Special Requests: NORMAL

## 2020-12-14 ENCOUNTER — Telehealth: Payer: Medicaid Other | Admitting: Physician Assistant

## 2020-12-14 ENCOUNTER — Encounter: Payer: Self-pay | Admitting: Physician Assistant

## 2020-12-14 DIAGNOSIS — R509 Fever, unspecified: Secondary | ICD-10-CM

## 2020-12-14 DIAGNOSIS — J029 Acute pharyngitis, unspecified: Secondary | ICD-10-CM

## 2020-12-14 DIAGNOSIS — R059 Cough, unspecified: Secondary | ICD-10-CM

## 2020-12-14 DIAGNOSIS — Z20822 Contact with and (suspected) exposure to covid-19: Secondary | ICD-10-CM

## 2020-12-14 DIAGNOSIS — J040 Acute laryngitis: Secondary | ICD-10-CM | POA: Diagnosis not present

## 2020-12-14 MED ORDER — CLINDAMYCIN HCL 300 MG PO CAPS: 300.0000 mg | ORAL_CAPSULE | Freq: Three times a day (TID) | ORAL | 0 refills | Status: DC

## 2020-12-14 NOTE — Progress Notes (Signed)
E-Visit for Corona Virus Screening  Your current symptoms could be consistent with the coronavirus.  Many health care providers can now test patients at their office but not all are.  [facility] has multiple testing sites. For information on our COVID testing locations and hours go to [email or web address]  We are enrolling you in our MyChart Home Monitoring for COVID19 . Daily you will receive a questionnaire within the MyChart website. Our COVID 19 response team will be monitoring your responses daily.  Testing Information: The COVID-19 Community Testing sites are testing BY APPOINTMENT ONLY.  You can schedule online at [email or web address]  If you do not have access to a smart phone or computer you may call [phone] for an appointment.   Additional testing sites in the Community:  . For [facility] Testing sites in [facility]  [email or web address]  . For Pop-up testing sites in [place]  [email or web address]  . For [facility] [email or web address]  . For [facility] testing in [place] and [employer] [email or web address]  . For [facility] testing in [facility]   [email or web address]  For  more information about community testing call [phone]   Please quarantine yourself while awaiting your test results. Please stay home for a minimum of 10 days from the first day of illness with improving symptoms and you have had 24 hours of no fever (without the use of Tylenol (Acetaminophen) Motrin (Ibuprofen) or any fever reducing medication).  Also - Do not get tested prior to returning to work because once you have had a positive test the test can stay  positive for more than a month in some cases.   You should wear a mask or cloth face covering over your nose and mouth if you must be around other people or animals, including pets (even at home). Try to stay at least 6 feet away from other people. This will protect the people around you.  Please continue good preventive care measures, including:  frequent hand-washing, avoid touching your face, cover coughs/sneezes, stay out of crowds and keep a 6 foot distance from others.  COVID-19 is a respiratory illness with symptoms that are similar to the flu. Symptoms are typically mild to moderate, but there have been cases of severe illness and death due to the virus.   The following symptoms may appear 2-14 days after exposure: . Fever . Cough . Shortness of breath or difficulty breathing . Chills . Repeated shaking with chills  . Muscle pain . Headache . Sore throat . New loss of taste or smell . Fatigue . Congestion or runny nose . Nausea or vomiting . Diarrhea  Go to the nearest hospital ED for assessment if fever/cough/breathlessness are severe or illness seems like a threat to life.  It is vitally important that if you feel that you have an infection such as this virus or any other virus that you stay home and away from places where you may spread it to others.  You should avoid contact with people age 40 and older.    Sore throat may be viral in nature, however, due to severity (pain with swallowing) and hoarseness of voice, will Rx antibiotic. Clindamycin 300 mg three times a day for 10 days.  PLEASE BE MINDFUL- THE PRESCRIBED ANTIBIOTIC IS STRONG AND DUE TO MULTIPLE ALLERGIES TO ANTIBIOTIC, IT IS THE ONE INDICATED. YOUR SYMPTOMS MAY BE VIRAL IN NATURE. PLEASE TRY OTC THROAT LOZENGES FOR YOUR SYMPTOMS. IF SEVERE SORE THROAT PERSIST, BEGIN THE PRESCRIBED ANTIBIOTIC.   You  may also take acetaminophen (Tylenol) as needed for fever.  Reduce your risk of any infection by using the same  precautions used for avoiding the common cold or flu:  Marland Kitchen Wash your hands often with soap and warm water for at least 20 seconds.  If soap and water are not readily available, use an alcohol-based hand sanitizer with at least 60% alcohol.  . If coughing or sneezing, cover your mouth and nose by coughing or sneezing into the elbow areas of your shirt or coat, into a tissue or into your sleeve (not your hands). . Avoid shaking hands with others and consider head nods or verbal greetings only. . Avoid touching your eyes, nose, or mouth with unwashed hands.  . Avoid close contact with people who are sick. . Avoid places or events with large numbers of people in one location, like concerts or sporting events. . Carefully consider travel plans you have or are making. . If you are planning any travel outside or inside the [place], visit the CDC's Travelers' Health webpage for the latest health notices. . If you have some symptoms but not all symptoms, continue to monitor at home and seek medical attention if your symptoms worsen. . If you are having a medical emergency, call 911.  HOME CARE . Only take medications as instructed by your medical team. . Drink plenty of fluids and get plenty of rest. . A steam or ultrasonic humidifier can help if you have congestion.   GET HELP RIGHT AWAY IF YOU HAVE EMERGENCY WARNING SIGNS** FOR COVID-19. If you or someone is showing any of these signs seek emergency medical care immediately. Call 911 or proceed to your closest emergency facility if: . You develop worsening high fever. . Trouble breathing . Bluish lips or face . Persistent pain or pressure in the chest . New confusion . Inability to wake or stay awake . You cough up blood. . Your symptoms become more severe  **This list is not all possible symptoms. Contact your medical provider for any symptoms that are sever or concerning to you.  MAKE SURE YOU   Understand these instructions.  Will watch your  condition.  Will get help right away if you are not doing well or get worse.  Your e-visit answers were reviewed by a board certified advanced clinical practitioner to complete your personal care plan.  Depending on the condition, your plan could have included both over the counter or prescription medications.  If there is a problem please reply once you have received a response from your provider.  Your safety is important to [place].  If you have drug allergies check your prescription carefully.    You can use MyChart to ask questions about today's visit, request a non-urgent call back, or ask for a work or school excuse for 24 hours related to this e-Visit. If it has been greater than 24 hours you will need to follow up with your provider, or enter a new e-Visit to address those concerns. You will get an e-mail in the next two days asking about your experience.  I hope that your e-visit has been valuable and will speed your recovery. Thank you for using e-visits.   I spent 5-10 minutes on review and completion of this note- [facility]    We are sorry that you are not feeling well.  Here is how we plan to help!  Based on what you have shared with me it is likely that you have  strep pharyngitis.  Strep pharyngitis is inflammation and infection in the back of the throat.  This is an infection cause by bacteria and is treated with antibiotics.  I have prescribed Clindamycin 300 mg three times a day for 10 days. For throat pain, we recommend over the counter oral pain relief medications such as acetaminophen or aspirin, or anti-inflammatory medications such as ibuprofen or naproxen sodium. Topical treatments such as oral throat lozenges or sprays may be used as needed. Strep infections are not as easily transmitted as other respiratory infections, however we still recommend that you avoid close contact with loved ones, especially the very young and elderly.  Remember to wash your hands thoroughly  throughout the day as this is the number one way to prevent the spread of infection and wipe down door knobs and counters with disinfectant.   Home Care:  Only take medications as instructed by your medical team.  Complete the entire course of an antibiotic.  Do not take these medications with alcohol.  A steam or ultrasonic humidifier can help congestion.  You can place a towel over your head and breathe in the steam from hot water coming from a faucet.  Avoid close contacts especially the very young and the elderly.  Cover your mouth when you cough or sneeze.  Always remember to wash your hands.  Get Help Right Away If:  You develop worsening fever or sinus pain.  You develop a severe head ache or visual changes.  Your symptoms persist after you have completed your treatment plan.  Make sure you  Understand these instructions.  Will watch your condition.  Will get help right away if you are not doing well or get worse.  Your e-visit answers were reviewed by a board certified advanced clinical practitioner to complete your personal care plan.  Depending on the condition, your plan could have included both over the counter or prescription medications.  If there is a problem please reply  once you have received a response from your provider.  Your safety is important to [place].  If you have drug allergies check your prescription carefully.    You can use MyChart to ask questions about today's visit, request a non-urgent call back, or ask for a work or school excuse for 24 hours related to this e-Visit. If it has been greater than 24 hours you will need to follow up with your provider, or enter a new e-Visit to address those concerns.  You will get an e-mail in the next two days asking about your experience.  I hope that your e-visit has been valuable and will speed your recovery. Thank you for using e-visits.

## 2020-12-19 ENCOUNTER — Telehealth: Payer: Medicaid Other | Admitting: Family

## 2020-12-19 DIAGNOSIS — R112 Nausea with vomiting, unspecified: Secondary | ICD-10-CM

## 2020-12-19 MED ORDER — ONDANSETRON HCL 4 MG PO TABS: 4.0000 mg | ORAL_TABLET | Freq: Three times a day (TID) | ORAL | 0 refills | Status: DC | PRN

## 2020-12-19 NOTE — Progress Notes (Signed)

## 2021-01-08 ENCOUNTER — Telehealth: Payer: Medicaid Other | Admitting: Physician Assistant

## 2021-01-08 DIAGNOSIS — M546 Pain in thoracic spine: Secondary | ICD-10-CM | POA: Diagnosis not present

## 2021-01-08 MED ORDER — BACLOFEN 10 MG PO TABS: 10.0000 mg | ORAL_TABLET | Freq: Three times a day (TID) | ORAL | 0 refills | Status: DC

## 2021-01-08 NOTE — Progress Notes (Signed)
I have spent 5 minutes in review of e-visit questionnaire, review and updating patient chart, medical decision making and response to patient.   Katie Moch Cody Eliyanna Ault, PA-C    

## 2021-01-08 NOTE — Progress Notes (Signed)
We are sorry that you are not feeling well.  Here is how we plan to help!  Based on what you have shared with me it looks like you mostly have acute back pain.  Acute back pain is defined as musculoskeletal pain that can resolve in 1-3 weeks with conservative treatment.  I have prescribed Baclofen 10 mg every eight hours as needed which is a muscle relaxer  Some patients experience stomach irritation or in increased heartburn with anti-inflammatory drugs.  Please keep in mind that muscle relaxer's can cause fatigue and should not be taken while at work or driving.  Back pain is very common.  The pain often gets better over time.  The cause of back pain is usually not dangerous.  Most people can learn to manage their back pain on their own.  Please complete and submit a second e-Visit questionnaire about (stye symptoms to help [place] clarify your symptoms.  You will not be charged for the second questionnaire as we will void the charge for the second visit.  The additional questions and your answers are a vital part of triaging the illness to determine the best course of treatment and appropriate medications. Thank you.   Home Care Stay active.  Start with short walks on flat ground if you can.  Try to walk farther each day. Do not sit, drive or stand in one place for more than 30 minutes.  Do not stay in bed. Do not avoid exercise or work.  Activity can help your back heal faster. Be careful when you bend or lift an object.  Bend at your knees, keep the object close to you, and do not twist. Sleep on a firm mattress.  Lie on your side, and bend your knees.  If you lie on your back, put a pillow under your knees. Only take medicines as told by your doctor. Put ice on the injured area. Put ice in a plastic bag Place a towel between your skin and the bag Leave the ice on for 15-20 minutes, 3-4 times a day for the first 2-3 days. 210 After that, you can switch between ice and heat packs. Ask your  doctor about back exercises or massage. Avoid feeling anxious or stressed.  Find good ways to deal with stress, such as exercise.  Get Help Right Way If: Your pain does not go away with rest or medicine. Your pain does not go away in 1 week. You have new problems. You do not feel well. The pain spreads into your legs. You cannot control when you poop (bowel movement) or pee (urinate) You feel sick to your stomach (nauseous) or throw up (vomit) You have belly (abdominal) pain. You feel like you may pass out (faint). If you develop a fever.  Make Sure you: Understand these instructions. Will watch your condition Will get help right away if you are not doing well or get worse.  Your e-visit answers were reviewed by a board certified advanced clinical practitioner to complete your personal care plan.  Depending on the condition, your plan could have included both over the counter or prescription medications.  If there is a problem please reply  once you have received a response from your provider.  Your safety is important to [place].  If you have drug allergies check your prescription carefully.    You can use MyChart to ask questions about today's visit, request a non-urgent call back, or ask for a work or school excuse for 24  hours related to this e-Visit. If it has been greater than 24 hours you will need to follow up with your provider, or enter a new e-Visit to address those concerns.  You will get an e-mail in the next two days asking about your experience.  I hope that your e-visit has been valuable and will speed your recovery. Thank you for using e-visits.

## 2021-01-14 ENCOUNTER — Telehealth: Payer: Medicaid Other | Admitting: Physician Assistant

## 2021-01-14 ENCOUNTER — Other Ambulatory Visit: Payer: Self-pay

## 2021-01-14 ENCOUNTER — Emergency Department (HOSPITAL_COMMUNITY)
Admission: EM | Admit: 2021-01-14 | Discharge: 2021-01-14 | Discharge status: Against Medical Advice | Payer: Medicaid Other

## 2021-01-14 ENCOUNTER — Encounter (HOSPITAL_BASED_OUTPATIENT_CLINIC_OR_DEPARTMENT_OTHER): Payer: Self-pay | Admitting: Obstetrics and Gynecology

## 2021-01-14 ENCOUNTER — Encounter: Payer: Self-pay | Admitting: Physician Assistant

## 2021-01-14 DIAGNOSIS — K760 Fatty (change of) liver, not elsewhere classified: Secondary | ICD-10-CM | POA: Diagnosis present

## 2021-01-14 DIAGNOSIS — R109 Unspecified abdominal pain: Secondary | ICD-10-CM

## 2021-01-14 DIAGNOSIS — Z881 Allergy status to other antibiotic agents status: Secondary | ICD-10-CM

## 2021-01-14 DIAGNOSIS — Z79899 Other long term (current) drug therapy: Secondary | ICD-10-CM

## 2021-01-14 DIAGNOSIS — R1013 Epigastric pain: Secondary | ICD-10-CM

## 2021-01-14 DIAGNOSIS — R7401 Elevation of levels of liver transaminase levels: Secondary | ICD-10-CM | POA: Diagnosis present

## 2021-01-14 DIAGNOSIS — Z888 Allergy status to other drugs, medicaments and biological substances status: Secondary | ICD-10-CM

## 2021-01-14 DIAGNOSIS — K8020 Calculus of gallbladder without cholecystitis without obstruction: Principal | ICD-10-CM | POA: Diagnosis present

## 2021-01-14 DIAGNOSIS — Z87440 Personal history of urinary (tract) infections: Secondary | ICD-10-CM

## 2021-01-14 DIAGNOSIS — E876 Hypokalemia: Secondary | ICD-10-CM | POA: Diagnosis present

## 2021-01-14 DIAGNOSIS — G40909 Epilepsy, unspecified, not intractable, without status epilepticus: Secondary | ICD-10-CM | POA: Diagnosis present

## 2021-01-14 DIAGNOSIS — R1905 Periumbilic swelling, mass or lump: Secondary | ICD-10-CM

## 2021-01-14 DIAGNOSIS — J45909 Unspecified asthma, uncomplicated: Secondary | ICD-10-CM | POA: Diagnosis present

## 2021-01-14 DIAGNOSIS — F17210 Nicotine dependence, cigarettes, uncomplicated: Secondary | ICD-10-CM | POA: Diagnosis present

## 2021-01-14 DIAGNOSIS — Z91040 Latex allergy status: Secondary | ICD-10-CM

## 2021-01-14 DIAGNOSIS — Z20822 Contact with and (suspected) exposure to covid-19: Secondary | ICD-10-CM | POA: Diagnosis present

## 2021-01-14 DIAGNOSIS — E7800 Pure hypercholesterolemia, unspecified: Secondary | ICD-10-CM | POA: Diagnosis present

## 2021-01-14 DIAGNOSIS — K580 Irritable bowel syndrome with diarrhea: Secondary | ICD-10-CM | POA: Diagnosis present

## 2021-01-14 DIAGNOSIS — K219 Gastro-esophageal reflux disease without esophagitis: Secondary | ICD-10-CM | POA: Diagnosis present

## 2021-01-14 DIAGNOSIS — R32 Unspecified urinary incontinence: Secondary | ICD-10-CM

## 2021-01-14 DIAGNOSIS — R7989 Other specified abnormal findings of blood chemistry: Secondary | ICD-10-CM | POA: Diagnosis present

## 2021-01-14 DIAGNOSIS — F4310 Post-traumatic stress disorder, unspecified: Secondary | ICD-10-CM | POA: Diagnosis present

## 2021-01-14 DIAGNOSIS — Z885 Allergy status to narcotic agent status: Secondary | ICD-10-CM

## 2021-01-14 DIAGNOSIS — I10 Essential (primary) hypertension: Secondary | ICD-10-CM | POA: Diagnosis present

## 2021-01-14 LAB — URINALYSIS, ROUTINE W REFLEX MICROSCOPIC
Glucose, UA: NEGATIVE mg/dL
Ketones, ur: NEGATIVE mg/dL
Nitrite: NEGATIVE
Protein, ur: 30 mg/dL — AB
RBC / HPF: >50 RBC/hpf — ABNORMAL HIGH (ref 0–5)
Specific Gravity, Urine: 1.03 (ref 1.005–1.030)
pH: 6 (ref 5.0–8.0)

## 2021-01-14 LAB — CBC
HCT: 42.4 % (ref 36.0–46.0)
Hemoglobin: 13.8 g/dL (ref 12.0–15.0)
MCH: 27.4 pg (ref 26.0–34.0)
MCHC: 32.5 g/dL (ref 30.0–36.0)
MCV: 84.1 fL (ref 80.0–100.0)
Platelets: 303 10*3/uL (ref 150–400)
RBC: 5.04 MIL/uL (ref 3.87–5.11)
RDW: 14.5 % (ref 11.5–15.5)
WBC: 7.7 10*3/uL (ref 4.0–10.5)
nRBC: 0 % (ref 0.0–0.2)

## 2021-01-14 LAB — PREGNANCY, URINE: Preg Test, Ur: NEGATIVE

## 2021-01-14 NOTE — ED Notes (Signed)
Pt called for triage x2. No answer  

## 2021-01-14 NOTE — ED Notes (Signed)
Called x1 for triage. No answer

## 2021-01-14 NOTE — Progress Notes (Signed)
Based on what you shared with me, I feel your condition warrants further evaluation and I recommend that you be seen in a face to face visit.  Giving pain, incontinence and abdominal bulge you need in person evaluation ASAP to determine cause of symptoms and make sure nothing more serious present -- strangulated hernia, etc. Please be seen at local Urgent Care or ER ASAP.    NOTE: There will be NO CHARGE for this eVisit   If you are having a true medical emergency please call 911.      For an urgent face to face visit, [facility] has six urgent care centers for your convenience:     [facility] Center at [facility] Directions [phone] [address] [place], [place] [address]    [facility]) Get Driving Directions [phone] [address] [place], [place] [address]  [facility] Center [facility]) Get Driving Directions [phone] [address] [place],  [place]  [address]  [facility] at [facility] Get Driving Directions [phone] [address] [place], [place] [address]   [facility] Urgent Care at [facility] Get Driving Directions  [phone] [address] [place], [place] [address]   [facility] Health Urgent Care at [facility] Directions [phone] [address] [place], [place] [address]  Your MyChart E-visit questionnaire answers were reviewed by a board certified advanced clinical practitioner to complete your personal care plan based on your specific symptoms.  Thank you for using e-Visits.

## 2021-01-14 NOTE — ED Triage Notes (Signed)
Patient reports to the ER for abdominal pain. Patient states she has a hx of liver problems. Patient reports her Periumbilical regiona is hurting.

## 2021-01-14 NOTE — Progress Notes (Signed)
Virtual Visit Consent   Jacqueline Jones, you are scheduled for a virtual visit with a [facility] provider today.     Just as with appointments in the office, your consent must be obtained to participate.  Your consent will be active for this visit and any virtual visit you may have with one of our providers in the next 365 days.     If you have a MyChart account, a copy of this consent can be sent to you electronically.  All virtual visits are billed to your insurance company just like a traditional visit in the office.    As this is a virtual visit, video technology does not allow for your provider to perform a traditional examination.  This may limit your provider's ability to fully assess your condition.  If your provider identifies any concerns that need to be evaluated in person or the need to arrange testing (such as labs, EKG, etc.), we will make arrangements to do so.     Although advances in technology are sophisticated, we cannot ensure that it will always work on either your end or our end.  If the connection with a video visit is poor, the visit may have to be switched to a telephone visit.  With either a video or telephone visit, we are not always able to ensure that we have a secure connection.     I need to obtain your verbal consent now.   Are you willing to proceed with your visit today?    Jacqueline Jones has provided verbal consent on 01/14/2021 for a virtual visit (video or telephone).   Jacqueline Jones, [place]   Date: 01/14/2021 6:31 PM   Virtual Visit via Video Note   I, Jacqueline Jones, connected with Jacqueline Jones ([id number], 18-Mar-1991) on 01/14/21 at  6:15 PM EDT by a video-enabled telemedicine application and verified that I am speaking with the correct person using two identifiers.  Location: Patient: Virtual Visit Location Patient: Home Provider: Virtual Visit Location Provider: Home Office   I discussed the limitations of evaluation and  management by telemedicine and the availability of in person appointments. The patient expressed understanding and agreed to proceed.    History of Present Illness: Jacqueline Jones is a 30 y.o. who identifies as a female who was assigned female at birth, and is being seen today for 2 days of worsening pain. Notes 2 evenings ago she started having a chest pain in the sternal region while sitting in bed. Felt like a soreness and ache which then moved down into the epigastric area. Continued and yesterday noted a bulge at the top of her stomach which is sometimes hard and sometimes very painful to touch. The pain does not move anywhere else. Initially noted R-sided thoracic back pain which has resolved. Has noted diarrhea x 1 day. Denies melena, hematochezia or tenesmus. Also notes vomiting last night -- food particles. Denies coffee ground emesis. Notes if she moves a certain way the pain will be excruciating. Now a 7-8/10. States she did move some heavy boxes of sodas the other day but no other heavy lifting. Of note she submitted an evisit earlier and was told to seek in-person evaluation ASAP.   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   Adhesive [Tape] Rash   Azithromycin Rash   Benzonatate Rash   Keflet [Cephalexin] Rash    Did it involve swelling of the face/tongue/throat, SOB, or low BP? No Did it involve sudden or severe rash/hives, skin peeling, or any reaction on the inside of your mouth or nose? No Did you need to seek medical attention at a hospital or doctor's office? No When did it last happen?       If all above answers are "NO", may proceed with cephalosporin use.    Latex Rash   Levaquin [Levofloxacin In D5w] Rash   Medications:  Current Outpatient Medications:    baclofen (LIORESAL) 10 MG tablet, Take 1 tablet (10 mg total) by mouth 3 (three) times daily., Disp: 30 each, Rfl: 0   guaiFENesin (ROBITUSSIN) 100 MG/5ML liquid, Take 5-10 mLs (100-200 mg total) by mouth every 4 (four) hours as needed for cough. FOR CHEST CONGESTION AND PHLEGM (Patient not taking: Reported on 07/07/2020), Disp: 60 mL, Rfl: 0   levETIRAcetam (KEPPRA) 750 MG tablet, Take 1 tablet (750 mg total) by mouth 2 (two) times daily., Disp: 60 tablet, Rfl: 0   melatonin 3 MG TABS tablet, Take 1 tablet (3 mg total) by mouth at bedtime., Disp: 30 tablet, Rfl: 0   ondansetron (ZOFRAN) 4 MG tablet, Take 1 tablet (4 mg total) by mouth every 8 (eight) hours as needed for nausea or vomiting., Disp: 20 tablet, Rfl: 0   pantoprazole (PROTONIX) 40 MG tablet, Take 1 tablet (40 mg total) by mouth daily. (Patient not taking: Reported on 07/07/2020), Disp: 30 tablet, Rfl: 3   TRI-ESTARYLLA 0.18/0.215/0.25 MG-35 MCG tablet, Take 1 tablet by mouth daily., Disp: , Rfl:   Observations/Objective: Patient is well-developed, well-nourished in moderate pain. Resting  on couch at home but is noted to be very uncomfortable.  Head is normocephalic, atraumatic.  No labored breathing.  Speech is clear and coherent with logical  content.  Patient is alert and oriented at baseline.  Upper abdomen examined via video -- hard to see the mentioned bulge but the area she is pointing to has overlying erythema and she noted exquisite tenderness when touching the "bulge"  Assessment and Plan: 1. Epigastric pain Concern for incarcerated hernia with strangulation giving severity of pain and overlying redness. Discussed need for emergency evaluation. She initially refused but then was agreeable to go to ER. Wants to avoid EMS transportation. Father-in-law taking her directly to ER for evaluation.   Follow Up Instructions: I discussed the assessment and treatment plan with the patient. The patient was provided an opportunity to ask questions and all were answered. The patient agreed with the plan and demonstrated an understanding of the instructions.  A copy of instructions were sent to the patient via MyChart.  The patient was advised to call back or seek an in-person evaluation if the symptoms worsen or if the condition fails to improve as anticipated.  Time:  I spent 12 minutes with the patient via telehealth technology discussing the above problems/concerns.    Jacqueline Climes, PA-C

## 2021-01-15 ENCOUNTER — Encounter (HOSPITAL_COMMUNITY): Payer: Self-pay | Admitting: Internal Medicine

## 2021-01-15 ENCOUNTER — Observation Stay (HOSPITAL_COMMUNITY): Payer: Medicaid Other

## 2021-01-15 ENCOUNTER — Inpatient Hospital Stay (HOSPITAL_BASED_OUTPATIENT_CLINIC_OR_DEPARTMENT_OTHER)
Admission: EM | Admit: 2021-01-15 | Discharge: 2021-01-17 | DRG: 446 | Disposition: A | Discharge status: Home / Self Care | Payer: Medicaid Other | Attending: Family Medicine | Admitting: Family Medicine

## 2021-01-15 ENCOUNTER — Emergency Department (HOSPITAL_BASED_OUTPATIENT_CLINIC_OR_DEPARTMENT_OTHER): Payer: Medicaid Other

## 2021-01-15 DIAGNOSIS — G40909 Epilepsy, unspecified, not intractable, without status epilepticus: Secondary | ICD-10-CM | POA: Diagnosis not present

## 2021-01-15 DIAGNOSIS — R7401 Elevation of levels of liver transaminase levels: Secondary | ICD-10-CM | POA: Diagnosis not present

## 2021-01-15 DIAGNOSIS — Z888 Allergy status to other drugs, medicaments and biological substances status: Secondary | ICD-10-CM | POA: Diagnosis not present

## 2021-01-15 DIAGNOSIS — K8020 Calculus of gallbladder without cholecystitis without obstruction: Secondary | ICD-10-CM | POA: Diagnosis present

## 2021-01-15 DIAGNOSIS — Z885 Allergy status to narcotic agent status: Secondary | ICD-10-CM | POA: Diagnosis not present

## 2021-01-15 DIAGNOSIS — Z20822 Contact with and (suspected) exposure to covid-19: Secondary | ICD-10-CM | POA: Diagnosis not present

## 2021-01-15 DIAGNOSIS — E7800 Pure hypercholesterolemia, unspecified: Secondary | ICD-10-CM | POA: Diagnosis not present

## 2021-01-15 DIAGNOSIS — K760 Fatty (change of) liver, not elsewhere classified: Secondary | ICD-10-CM | POA: Diagnosis not present

## 2021-01-15 DIAGNOSIS — Z881 Allergy status to other antibiotic agents status: Secondary | ICD-10-CM | POA: Diagnosis not present

## 2021-01-15 DIAGNOSIS — Z87440 Personal history of urinary (tract) infections: Secondary | ICD-10-CM | POA: Diagnosis not present

## 2021-01-15 DIAGNOSIS — E876 Hypokalemia: Secondary | ICD-10-CM | POA: Diagnosis not present

## 2021-01-15 DIAGNOSIS — Z91040 Latex allergy status: Secondary | ICD-10-CM | POA: Diagnosis not present

## 2021-01-15 DIAGNOSIS — K8021 Calculus of gallbladder without cholecystitis with obstruction: Secondary | ICD-10-CM

## 2021-01-15 DIAGNOSIS — K219 Gastro-esophageal reflux disease without esophagitis: Secondary | ICD-10-CM | POA: Diagnosis not present

## 2021-01-15 DIAGNOSIS — R7989 Other specified abnormal findings of blood chemistry: Secondary | ICD-10-CM | POA: Diagnosis not present

## 2021-01-15 DIAGNOSIS — F4310 Post-traumatic stress disorder, unspecified: Secondary | ICD-10-CM | POA: Diagnosis not present

## 2021-01-15 DIAGNOSIS — F17210 Nicotine dependence, cigarettes, uncomplicated: Secondary | ICD-10-CM | POA: Diagnosis not present

## 2021-01-15 DIAGNOSIS — R109 Unspecified abdominal pain: Secondary | ICD-10-CM

## 2021-01-15 DIAGNOSIS — Z79899 Other long term (current) drug therapy: Secondary | ICD-10-CM | POA: Diagnosis not present

## 2021-01-15 DIAGNOSIS — J45909 Unspecified asthma, uncomplicated: Secondary | ICD-10-CM | POA: Diagnosis not present

## 2021-01-15 DIAGNOSIS — I10 Essential (primary) hypertension: Secondary | ICD-10-CM | POA: Diagnosis not present

## 2021-01-15 DIAGNOSIS — R1013 Epigastric pain: Secondary | ICD-10-CM

## 2021-01-15 DIAGNOSIS — K580 Irritable bowel syndrome with diarrhea: Secondary | ICD-10-CM | POA: Diagnosis not present

## 2021-01-15 DIAGNOSIS — M546 Pain in thoracic spine: Secondary | ICD-10-CM

## 2021-01-15 LAB — COMPREHENSIVE METABOLIC PANEL
ALT: 564 U/L — ABNORMAL HIGH (ref 0–44)
ALT: 454 U/L — ABNORMAL HIGH (ref 0–44)
AST: 643 U/L — ABNORMAL HIGH (ref 15–41)
AST: 380 U/L — ABNORMAL HIGH (ref 15–41)
Albumin: 4.3 g/dL (ref 3.5–5.0)
Albumin: 3.6 g/dL (ref 3.5–5.0)
Alkaline Phosphatase: 281 U/L — ABNORMAL HIGH (ref 38–126)
Alkaline Phosphatase: 276 U/L — ABNORMAL HIGH (ref 38–126)
Anion gap: 10 (ref 5–15)
Anion gap: 9 (ref 5–15)
BUN: 9 mg/dL (ref 6–20)
BUN: 6 mg/dL (ref 6–20)
CO2: 24 mmol/L (ref 22–32)
CO2: 19 mmol/L — ABNORMAL LOW (ref 22–32)
Calcium: 9.3 mg/dL (ref 8.9–10.3)
Calcium: 8.2 mg/dL — ABNORMAL LOW (ref 8.9–10.3)
Chloride: 105 mmol/L (ref 98–111)
Chloride: 110 mmol/L (ref 98–111)
Creatinine, Ser: 0.56 mg/dL (ref 0.44–1.00)
Creatinine, Ser: 0.42 mg/dL — ABNORMAL LOW (ref 0.44–1.00)
GFR, Estimated: >60 mL/min (ref >60)
GFR, Estimated: >60 mL/min (ref >60)
Glucose, Bld: 99 mg/dL (ref 70–99)
Glucose, Bld: 82 mg/dL (ref 70–99)
Potassium: 3.5 mmol/L (ref 3.5–5.1)
Potassium: 3.5 mmol/L (ref 3.5–5.1)
Sodium: 139 mmol/L (ref 135–145)
Sodium: 138 mmol/L (ref 135–145)
Total Bilirubin: 3.4 mg/dL — ABNORMAL HIGH (ref 0.3–1.2)
Total Bilirubin: 2.5 mg/dL — ABNORMAL HIGH (ref 0.3–1.2)
Total Protein: 7.1 g/dL (ref 6.5–8.1)
Total Protein: 6.7 g/dL (ref 6.5–8.1)

## 2021-01-15 LAB — CBC WITH DIFFERENTIAL/PLATELET
Abs Immature Granulocytes: 0.02 10*3/uL (ref 0.00–0.07)
Basophils Absolute: 0.1 10*3/uL (ref 0.0–0.1)
Basophils Relative: 1 %
Eosinophils Absolute: 0.3 10*3/uL (ref 0.0–0.5)
Eosinophils Relative: 4 %
HCT: 40.2 % (ref 36.0–46.0)
Hemoglobin: 12.5 g/dL (ref 12.0–15.0)
Immature Granulocytes: 0 %
Lymphocytes Relative: 24 %
Lymphs Abs: 1.6 10*3/uL (ref 0.7–4.0)
MCH: 27.4 pg (ref 26.0–34.0)
MCHC: 31.1 g/dL (ref 30.0–36.0)
MCV: 88 fL (ref 80.0–100.0)
Monocytes Absolute: 0.4 10*3/uL (ref 0.1–1.0)
Monocytes Relative: 6 %
Neutro Abs: 4.5 10*3/uL (ref 1.7–7.7)
Neutrophils Relative %: 65 %
Platelets: 225 10*3/uL (ref 150–400)
RBC: 4.57 MIL/uL (ref 3.87–5.11)
RDW: 14.6 % (ref 11.5–15.5)
WBC: 6.9 10*3/uL (ref 4.0–10.5)
nRBC: 0 % (ref 0.0–0.2)

## 2021-01-15 LAB — PROTIME-INR
INR: 1 (ref 0.8–1.2)
Prothrombin Time: 12.9 seconds (ref 11.4–15.2)

## 2021-01-15 LAB — LIPASE, BLOOD: Lipase: 36 U/L (ref 11–51)

## 2021-01-15 LAB — RESP PANEL BY RT-PCR (FLU A&B, COVID) ARPGX2
Influenza A by PCR: NEGATIVE
Influenza B by PCR: NEGATIVE
SARS Coronavirus 2 by RT PCR: NEGATIVE

## 2021-01-15 LAB — HEPATITIS PANEL, ACUTE
HCV Ab: NONREACTIVE
Hep A IgM: NONREACTIVE
Hep B C IgM: NONREACTIVE
Hepatitis B Surface Ag: NONREACTIVE

## 2021-01-15 LAB — FERRITIN: Ferritin: 21 ng/mL (ref 11–307)

## 2021-01-15 LAB — IRON: Iron: 180 ug/dL — ABNORMAL HIGH (ref 28–170)

## 2021-01-15 MED ORDER — LAMOTRIGINE 100 MG PO TABS
200.0000 mg | ORAL_TABLET | Freq: Two times a day (BID) | ORAL | Status: DC
  Administered 2021-01-15 – 2021-01-17 (×4): 200 mg via ORAL
  Filled 2021-01-15 (×4): qty 2

## 2021-01-15 MED ORDER — MORPHINE SULFATE (PF) 4 MG/ML IV SOLN
4.0000 mg | INTRAVENOUS | Status: DC | PRN
  Administered 2021-01-15 – 2021-01-16 (×5): 4 mg via INTRAVENOUS
  Filled 2021-01-15 (×5): qty 1

## 2021-01-15 MED ORDER — LEVETIRACETAM IN NACL 1000 MG/100ML IV SOLN
1000.0000 mg | Freq: Every morning | INTRAVENOUS | Status: DC
  Administered 2021-01-15 – 2021-01-16 (×2): 1000 mg via INTRAVENOUS
  Filled 2021-01-15 (×2): qty 100

## 2021-01-15 MED ORDER — PROCHLORPERAZINE EDISYLATE 10 MG/2ML IJ SOLN
10.0000 mg | Freq: Four times a day (QID) | INTRAMUSCULAR | Status: DC | PRN
  Administered 2021-01-15: 10 mg via INTRAVENOUS
  Filled 2021-01-15 (×2): qty 2

## 2021-01-15 MED ORDER — FENTANYL CITRATE (PF) 100 MCG/2ML IJ SOLN
75.0000 ug | INTRAMUSCULAR | Status: DC | PRN
  Administered 2021-01-15: 75 ug via INTRAVENOUS
  Filled 2021-01-15: qty 2

## 2021-01-15 MED ORDER — ACETAMINOPHEN 325 MG PO TABS
650.0000 mg | ORAL_TABLET | Freq: Four times a day (QID) | ORAL | Status: DC | PRN
  Administered 2021-01-15: 650 mg via ORAL
  Filled 2021-01-15: qty 2

## 2021-01-15 MED ORDER — SODIUM CHLORIDE 0.9 % IV SOLN
1000.0000 mL | INTRAVENOUS | Status: DC
  Administered 2021-01-15 (×3): 1000 mL via INTRAVENOUS

## 2021-01-15 MED ORDER — ALPRAZOLAM 0.25 MG PO TABS
0.2500 mg | ORAL_TABLET | Freq: Once | ORAL | Status: AC
  Administered 2021-01-15: 0.25 mg via ORAL
  Filled 2021-01-15: qty 1

## 2021-01-15 MED ORDER — LEVETIRACETAM IN NACL 1500 MG/100ML IV SOLN
1500.0000 mg | Freq: Every day | INTRAVENOUS | Status: DC
  Administered 2021-01-15: 1500 mg via INTRAVENOUS
  Filled 2021-01-15: qty 100

## 2021-01-15 MED ORDER — SODIUM CHLORIDE 0.9 % IV BOLUS (SEPSIS)
1000.0000 mL | Freq: Once | INTRAVENOUS | Status: AC
  Administered 2021-01-15: 1000 mL via INTRAVENOUS

## 2021-01-15 MED ORDER — MORPHINE SULFATE (PF) 2 MG/ML IV SOLN: 2.0000 mg | INTRAVENOUS | Status: DC | PRN

## 2021-01-15 MED ORDER — FENTANYL CITRATE (PF) 100 MCG/2ML IJ SOLN
100.0000 ug | Freq: Once | INTRAMUSCULAR | Status: AC
  Administered 2021-01-15: 100 ug via INTRAVENOUS
  Filled 2021-01-15: qty 2

## 2021-01-15 MED ORDER — IOHEXOL 300 MG/ML  SOLN: 100.0000 mL | Freq: Once | INTRAMUSCULAR | Status: DC | PRN

## 2021-01-15 MED ORDER — PANTOPRAZOLE SODIUM 40 MG IV SOLR
40.0000 mg | Freq: Every day | INTRAVENOUS | Status: DC
  Administered 2021-01-15 – 2021-01-17 (×3): 40 mg via INTRAVENOUS
  Filled 2021-01-15 (×3): qty 40

## 2021-01-15 NOTE — ED Notes (Signed)
Report called to care link 

## 2021-01-15 NOTE — H&P (Signed)
History and Physical    JALAN BODI ZOX:[id number] DOB: 06/19/1991 DOA: 01/15/2021  PCP: Norm Salt, PA  Patient coming from: Home  Chief Complaint: abdominal pain  HPI: Jacqueline Jones is a 30 y.o. female with medical history significant of GERD, epilepsy. Presenting with abdominal pain. She reports that it started about 3 - 4 days ago. It was epigastric, non-radiating, constant pressure. She rates it 6/10 on the pain scale. She says that nothing made it worse or better. She tried aspirin and muscle relaxers, but they did not help. She had N/V. No hematemesis. She had diarrhea until yesterday. No hematochezia. She tried on e-health visit yesterday and it was recommended that she come to the ED. She denies any other aggravating or alleviating factors.   ED Course: She was found to have elevated LFTs. Her CT ab/pelvis was unrevealing. She was given pain control and fluids. TRH was called for admission.   Review of Systems:  Denies CP, palpitations, lightheadedness, dizziness, fevers, hematochezia, hematemesis. Review of systems is otherwise negative for all not mentioned in HPI.   PMHx Past Medical History:  Diagnosis Date   Anemia    Anxiety    Anxiety    Asthma    Depression    Depression    Elevated cholesterol    Frequent headaches    High blood pressure    IBS (irritable bowel syndrome)    Infection    UTI   Obesity    Pneumonia    Seizures (HCC)    unknown cause- started 08/14  "monthly"   Sepsis (HCC)    UTI (lower urinary tract infection)     PSHx Past Surgical History:  Procedure Laterality Date   COLONOSCOPY     age [age]   DIAGNOSTIC LAPAROSCOPY      SocHx  reports that she has quit smoking. Her smoking use included cigarettes. She has a 2.50 pack-year smoking history. She has quit using smokeless tobacco. She reports previous drug use. Frequency: 7.00 times per week. She reports that she does not drink alcohol.  Allergies   Allergen Reactions   Toradol [Ketorolac Tromethamine] Itching    seizure   Clonazepam Other (See Comments)    Seizure   Flexeril [Cyclobenzaprine] Other (See Comments)    seizure   Gabapentin Other (See Comments)    seizures    Tramadol Itching    Seizures   Vicodin [Hydrocodone-Acetaminophen] Itching    Pt states she can take Tylenol without difficulty.   Adhesive [Tape] Rash   Azithromycin Rash   Benzonatate Rash   Keflet [Cephalexin] Rash    Did it involve swelling of the face/tongue/throat, SOB, or low BP? No Did it involve sudden or severe rash/hives, skin peeling, or any reaction on the inside of your mouth or nose? No Did you need to seek medical attention at a hospital or doctor's office? No When did it last happen?       If all above answers are "NO", may proceed with cephalosporin use.    Latex Rash   Levaquin [Levofloxacin In D5w] Rash    FamHx Family History  Problem Relation Age of Onset   Drug abuse Father    Cancer Father        age orange   Lung cancer Father    Depression Mother    Anxiety disorder Mother    Drug abuse Brother        incarcirated    Colon cancer Neg Hx  Prior to Admission medications   Medication Sig Start Date End Date Taking? Authorizing Provider  baclofen (LIORESAL) 10 MG tablet Take 1 tablet (10 mg total) by mouth 3 (three) times daily. 01/08/21   Waldon Merl, PA-C  guaiFENesin (ROBITUSSIN) 100 MG/5ML liquid Take 5-10 mLs (100-200 mg total) by mouth every 4 (four) hours as needed for cough. FOR CHEST CONGESTION AND PHLEGM Patient not taking: Reported on 07/07/2020 06/17/20   Liberty Handy, PA-C  levETIRAcetam (KEPPRA) 750 MG tablet Take 1 tablet (750 mg total) by mouth 2 (two) times daily. 11/06/19   Clapacs, Jackquline Denmark, MD  melatonin 3 MG TABS tablet Take 1 tablet (3 mg total) by mouth at bedtime. 03/23/20   Swayze, Ava, DO  ondansetron (ZOFRAN) 4 MG tablet Take 1 tablet (4 mg total) by mouth every 8 (eight) hours as  needed for nausea or vomiting. 12/19/20   Jannifer Rodney A, FNP  pantoprazole (PROTONIX) 40 MG tablet Take 1 tablet (40 mg total) by mouth daily. Patient not taking: Reported on 07/07/2020 01/03/20   Meredith Pel, NP  TRI-ESTARYLLA 0.18/0.215/0.25 MG-35 MCG tablet Take 1 tablet by mouth daily. 06/10/20   [provider]    Physical Exam: Vitals:   01/15/21 0700 01/15/21 0909 01/15/21 0915 01/15/21 0958  BP: 113/84 124/84 124/75 (!) 123/93  Pulse: 72 79 72 75  Resp: 13 16 14 16   Temp:  98.4 F (36.9 C)  98.2 F (36.8 C)  TempSrc:  Oral  Oral  SpO2: 98% 98% 98% 100%    General: 30 y.o. female resting in bed in NAD Eyes: PERRL, normal sclera ENMT: Nares patent w/o discharge, orophaynx clear, dentition normal, ears w/o discharge/lesions/ulcers Neck: Supple, trachea midline Cardiovascular: RRR, +S1, S2, no m/g/r, equal pulses throughout Respiratory: CTABL, no w/r/r, normal WOB GI: BS+, ND, epigastric TTP, soft, no masses noted, no organomegaly noted MSK: No e/c/c Skin: No rashes, bruises, ulcerations noted Neuro: A&O x 3, no focal deficits Psyc: Appropriate interaction and affect, calm/cooperative  Labs on Admission: I have personally reviewed following labs and imaging studies  CBC: Recent Labs  Lab 01/14/21 2310  WBC 7.7  HGB 13.8  HCT 42.4  MCV 84.1  PLT 303   Basic Metabolic Panel: Recent Labs  Lab 01/14/21 2310  NA 139  K 3.5  CL 105  CO2 24  GLUCOSE 99  BUN 9  CREATININE 0.56  CALCIUM 9.3   GFR: CrCl cannot be calculated (Unknown ideal weight.). Liver Function Tests: Recent Labs  Lab 01/14/21 2310  AST 643*  ALT 564*  ALKPHOS 281*  BILITOT 3.4*  PROT 7.1  ALBUMIN 4.3   Recent Labs  Lab 01/14/21 2310  LIPASE 36   No results for input(s): AMMONIA in the last 168 hours. Coagulation Profile: No results for input(s): INR, PROTIME in the last 168 hours. Cardiac Enzymes: No results for input(s): CKTOTAL, CKMB, CKMBINDEX, TROPONINI in  the last 168 hours. BNP (last 3 results) No results for input(s): PROBNP in the last 8760 hours. HbA1C: No results for input(s): HGBA1C in the last 72 hours. CBG: No results for input(s): GLUCAP in the last 168 hours. Lipid Profile: No results for input(s): CHOL, HDL, LDLCALC, TRIG, CHOLHDL, LDLDIRECT in the last 72 hours. Thyroid Function Tests: No results for input(s): TSH, T4TOTAL, FREET4, T3FREE, THYROIDAB in the last 72 hours. Anemia Panel: No results for input(s): VITAMINB12, FOLATE, FERRITIN, TIBC, IRON, RETICCTPCT in the last 72 hours. Urine analysis:    Component Value Date/Time  COLORURINE YELLOW 01/14/2021 2309   APPEARANCEUR HAZY (A) 01/14/2021 2309   LABSPEC 1.030 01/14/2021 2309   PHURINE 6.0 01/14/2021 2309   GLUCOSEU NEGATIVE 01/14/2021 2309   HGBUR LARGE (A) 01/14/2021 2309   BILIRUBINUR LARGE (A) 01/14/2021 2309   BILIRUBINUR negative 09/15/2020 1202   KETONESUR NEGATIVE 01/14/2021 2309   PROTEINUR 30 (A) 01/14/2021 2309   UROBILINOGEN 1.0 09/15/2020 1202   UROBILINOGEN 0.2 05/08/2014 1600   NITRITE NEGATIVE 01/14/2021 2309   LEUKOCYTESUR SMALL (A) 01/14/2021 2309    Radiological Exams on Admission: CT ABDOMEN PELVIS WO CONTRAST  Result Date: 01/15/2021 CLINICAL DATA:  Epigastric abdominal pain, periumbilical abdominal pain EXAM: CT ABDOMEN AND PELVIS WITHOUT CONTRAST TECHNIQUE: Multidetector CT imaging of the abdomen and pelvis was performed following the standard protocol without IV contrast. COMPARISON:  Right upper quadrant FINDINGS: Lower chest: The visualized lung bases are clear bilaterally. The visualized heart and pericardium are unremarkable. Small hiatal hernia. Hepatobiliary: No focal liver abnormality is seen. No gallstones, gallbladder wall thickening, or biliary dilatation. Pancreas: Unremarkable Spleen: Unremarkable Adrenals/Urinary Tract: Adrenal glands are unremarkable. Kidneys are normal, without renal calculi, focal lesion, or hydronephrosis.  Bladder is unremarkable. Stomach/Bowel: Stomach is within normal limits. Appendix appears normal. No evidence of bowel wall thickening, distention, or inflammatory changes. No free intraperitoneal gas or fluid. Vascular/Lymphatic: No significant vascular findings are present. No enlarged abdominal or pelvic lymph nodes. Reproductive: Uterus and bilateral adnexa are unremarkable. Other: Tiny fat containing umbilical hernia is unchanged. Musculoskeletal: No acute bone abnormality. No lytic or blastic bone lesions identified. IMPRESSION: No acute intra-abdominal pathology identified. No definite radiographic explanation for the patient's reported symptoms. Electronically Signed   By: Helyn Numbers MD   On: 01/15/2021 03:31    EKG: Independently reviewed. Normal Sinus, no st elevations  Assessment/Plan Abdominal pain Elevated LFTs N/V/D     - placed in obs, med-surg     - CT did not reveal a cause for her elevated LFTs w/ an obstructive pattern     - will check [place]; consider MRCP     - [facility] consulted, appreciate assistance     - continue fluids, pain control, anti-emetics     - NPO for now  GERD     - protonix  Epilepsy     - continue home keppra as IV for right now     - continue home lamictal as PO once she is off NPO status     - she reports that she has had difficulty with anesthesia in the past precipitating a seizure episode  DVT prophylaxis: lovenox  Code Status: FULL  Family Communication: None at bedside  Consults called: [facility]   Status is: Observation  The patient remains OBS appropriate and will d/c before 2 midnights.  Dispo: The patient is from: Home              Anticipated d/c is to: Home              Patient currently is not medically stable to d/c.   Difficult to place patient No  Time spent coordinating admission: 50 minutes  Gao Mitnick A Synia Douglass DO [facility]  If 7PM-7AM, please contact night-coverage [email or web address]  01/15/2021, 10:10 AM

## 2021-01-15 NOTE — ED Provider Notes (Signed)
[facility] EMERGENCY DEPT Provider Note  CSN: [id number] Arrival date & time: 01/14/21 2254  Chief Complaint(s) Abdominal Pain  HPI Jacqueline Jones is a 30 y.o. female    Abdominal Pain Pain location:  RUQ Pain quality: aching   Pain radiates to:  R flank Pain severity:  Moderate Onset quality:  Gradual Duration:  2 days Timing:  Constant Progression:  Worsening Chronicity:  New Relieved by:  Nothing Worsened by:  Movement, palpation and vomiting Associated symptoms: diarrhea, nausea and vomiting   Associated symptoms: no fever and no shortness of breath    Past Medical History Past Medical History:  Diagnosis Date   Anemia    Anxiety    Anxiety    Asthma    Depression    Depression    Elevated cholesterol    Frequent headaches    High blood pressure    IBS (irritable bowel syndrome)    Infection    UTI   Obesity    Pneumonia    Seizures (HCC)    unknown cause- started 08/14  "monthly"   Sepsis (HCC)    UTI (lower urinary tract infection)    Patient Active Problem List   Diagnosis Date Noted   Hypokalemia    Non-intractable vomiting    QT prolongation    Overdose of sedative or hypnotic 11/06/2019   Major depressive disorder, recurrent episode, severe (HCC) 11/04/2019   Seizure (HCC) 11/03/2019   Medical non-compliance 05/13/2014   Neurologic abnormality 05/13/2014   Folliculitis 05/13/2014   UTI (lower urinary tract infection) 02/18/2014   Pupil asymmetry 02/05/2014   Polysubstance dependence (HCC) 02/28/2012   GAD (generalized anxiety disorder) 09/02/2011   Home Medication(s) Prior to Admission medications   Medication Sig Start Date End Date Taking? Authorizing Provider  baclofen (LIORESAL) 10 MG tablet Take 1 tablet (10 mg total) by mouth 3 (three) times daily. 01/08/21   Waldon Merl, PA-C  guaiFENesin (ROBITUSSIN) 100 MG/5ML liquid Take 5-10 mLs (100-200 mg total) by mouth every 4 (four) hours as needed for cough. FOR  CHEST CONGESTION AND PHLEGM Patient not taking: Reported on 07/07/2020 06/17/20   Liberty Handy, PA-C  levETIRAcetam (KEPPRA) 750 MG tablet Take 1 tablet (750 mg total) by mouth 2 (two) times daily. 11/06/19   Clapacs, Jackquline Denmark, MD  melatonin 3 MG TABS tablet Take 1 tablet (3 mg total) by mouth at bedtime. 03/23/20   Swayze, Ava, DO  ondansetron (ZOFRAN) 4 MG tablet Take 1 tablet (4 mg total) by mouth every 8 (eight) hours as needed for nausea or vomiting. 12/19/20   Jannifer Rodney A, FNP  pantoprazole (PROTONIX) 40 MG tablet Take 1 tablet (40 mg total) by mouth daily. Patient not taking: Reported on 07/07/2020 01/03/20   Meredith Pel, NP  TRI-ESTARYLLA 0.18/0.215/0.25 MG-35 MCG tablet Take 1 tablet by mouth daily. 06/10/20   [provider]  Past Surgical History Past Surgical History:  Procedure Laterality Date   COLONOSCOPY     age [age]   DIAGNOSTIC LAPAROSCOPY     Family History Family History  Problem Relation Age of Onset   Drug abuse Father    Cancer Father        age orange   Lung cancer Father    Depression Mother    Anxiety disorder Mother    Drug abuse Brother        incarcirated    Colon cancer Neg Hx     Social History Social History   Tobacco Use   Smoking status: Former    Packs/day: 0.25    Years: 10.00    Pack years: 2.50    Types: Cigarettes   Smokeless tobacco: Former   Tobacco comments:    she is wanting to quit trying  Vaping Use   Vaping Use: Never used  Substance Use Topics   Alcohol use: No   Drug use: Not Currently    Frequency: 7.0 times per week   Allergies Toradol [ketorolac tromethamine], Clonazepam, Flexeril [cyclobenzaprine], Gabapentin, Tramadol, Vicodin [hydrocodone-acetaminophen], Adhesive [tape], Azithromycin, Benzonatate, Keflet [cephalexin], Latex, and Levaquin [levofloxacin in  d5w]  Review of Systems Review of Systems  Constitutional:  Negative for fever.  Respiratory:  Negative for shortness of breath.   Gastrointestinal:  Positive for abdominal pain, diarrhea, nausea and vomiting.  All other systems are reviewed and are negative for acute change except as noted in the HPI  Physical Exam Vital Signs  I have reviewed the triage vital signs BP (!) 117/92   Pulse 71   Temp 98.2 F (36.8 C)   Resp 15   SpO2 98%   Physical Exam Vitals reviewed.  Constitutional:      General: She is not in acute distress.    Appearance: She is well-developed. She is not diaphoretic.  HENT:     Head: Normocephalic and atraumatic.     Right Ear: External ear normal.     Left Ear: External ear normal.     Nose: Nose normal.  Eyes:     General: No scleral icterus.    Conjunctiva/sclera: Conjunctivae normal.  Neck:     Trachea: Phonation normal.  Cardiovascular:     Rate and Rhythm: Normal rate and regular rhythm.  Pulmonary:     Effort: Pulmonary effort is normal. No respiratory distress.     Breath sounds: No stridor.  Abdominal:     General: There is no distension.     Tenderness: There is abdominal tenderness in the right upper quadrant. There is guarding and rebound. Positive signs include Murphy's sign.  Musculoskeletal:        General: Normal range of motion.     Cervical back: Normal range of motion.  Neurological:     Mental Status: She is alert and oriented to person, place, and time.  Psychiatric:        Behavior: Behavior normal.    ED Results and Treatments Labs (all labs ordered are listed, but only abnormal results are displayed) Labs Reviewed  COMPREHENSIVE METABOLIC PANEL - Abnormal; Notable for the following components:      Result Value   AST 643 (*)    ALT 564 (*)    Alkaline Phosphatase 281 (*)    Total Bilirubin 3.4 (*)    All other components within normal limits  URINALYSIS, ROUTINE W REFLEX MICROSCOPIC - Abnormal; Notable for the  following components:  APPearance HAZY (*)    Hgb urine dipstick LARGE (*)    Bilirubin Urine LARGE (*)    Protein, ur 30 (*)    Leukocytes,Ua SMALL (*)    RBC / HPF >50 (*)    All other components within normal limits  RESP PANEL BY RT-PCR (FLU A&B, COVID) ARPGX2  LIPASE, BLOOD  CBC  PREGNANCY, URINE                                                                                                                         EKG  EKG Interpretation  Date/Time:  Thursday January 15 2021 01:51:10 EDT Ventricular Rate:  77 PR Interval:  156 QRS Duration: 86 QT Interval:  388 QTC Calculation: 440 R Axis:   19 Text Interpretation: Sinus rhythm Low voltage, precordial leads No significant change since last tracing Confirmed by Drema Pryardama, Kandace Elrod [phone]([id number]) on 01/15/2021 2:29:51 AM        Radiology CT ABDOMEN PELVIS WO CONTRAST  Result Date: 01/15/2021 CLINICAL DATA:  Epigastric abdominal pain, periumbilical abdominal pain EXAM: CT ABDOMEN AND PELVIS WITHOUT CONTRAST TECHNIQUE: Multidetector CT imaging of the abdomen and pelvis was performed following the standard protocol without IV contrast. COMPARISON:  Right upper quadrant FINDINGS: Lower chest: The visualized lung bases are clear bilaterally. The visualized heart and pericardium are unremarkable. Small hiatal hernia. Hepatobiliary: No focal liver abnormality is seen. No gallstones, gallbladder wall thickening, or biliary dilatation. Pancreas: Unremarkable Spleen: Unremarkable Adrenals/Urinary Tract: Adrenal glands are unremarkable. Kidneys are normal, without renal calculi, focal lesion, or hydronephrosis. Bladder is unremarkable. Stomach/Bowel: Stomach is within normal limits. Appendix appears normal. No evidence of bowel wall thickening, distention, or inflammatory changes. No free intraperitoneal gas or fluid. Vascular/Lymphatic: No significant vascular findings are present. No enlarged abdominal or pelvic lymph nodes. Reproductive: Uterus and  bilateral adnexa are unremarkable. Other: Tiny fat containing umbilical hernia is unchanged. Musculoskeletal: No acute bone abnormality. No lytic or blastic bone lesions identified. IMPRESSION: No acute intra-abdominal pathology identified. No definite radiographic explanation for the patient's reported symptoms. Electronically Signed   By: Helyn NumbersAshesh  Parikh MD   On: 01/15/2021 03:31    Pertinent labs & imaging results that were available during my care of the patient were reviewed by me and considered in my medical decision making (see chart for details).  Medications Ordered in ED Medications  sodium chloride 0.9 % bolus 1,000 mL (1,000 mLs Intravenous New Bag/Given 01/15/21 0151)    Followed by  0.9 %  sodium chloride infusion (1,000 mLs Intravenous New Bag/Given 01/15/21 0151)  fentaNYL (SUBLIMAZE) injection 100 mcg (100 mcg Intravenous Given 01/15/21 0151)  Procedures .1-3 Lead EKG Interpretation  Date/Time: 01/15/2021 3:42 AM Performed by: Nira Conn, MD Authorized by: Nira Conn, MD     Interpretation: normal     ECG rate:  78   ECG rate assessment: normal     Rhythm: sinus rhythm     Ectopy: none     Conduction: normal   Ultrasound ED Abd  Date/Time: 01/15/2021 3:42 AM Performed by: Nira Conn, MD Authorized by: Nira Conn, MD   Procedure details:    Indications: abdominal pain     Assessment for:  Gallstones   Hepatobiliary:  Visualized        Hepatobiliary findings:    Common bile duct:  Abnormal   Gallbladder wall:  Normal   Gallbladder stones: identified     Intra-abdominal fluid: not identified     Sonographic Murphy's sign: negative    (including critical care time)  Medical Decision Making / ED Course I have reviewed the nursing notes for this encounter and the patient's prior records (if  available in EHR or on provided paperwork).   Jacqueline Jones was evaluated in Emergency Department on 01/15/2021 for the symptoms described in the history of present illness. She was evaluated in the context of the global COVID-19 pandemic, which necessitated consideration that the patient might be at risk for infection with the SARS-CoV-2 virus that causes COVID-19. Institutional protocols and algorithms that pertain to the evaluation of patients at risk for COVID-19 are in a state of rapid change based on information released by regulatory bodies including the CDC and federal and state organizations. These policies and algorithms were followed during the patient's care in the ED.  Upper abdominal pain. Work-up suspicious for obstructing choledocholithiasis. No evidence of acute cholecystitis at this time. Patient is not septic. Treated with IV fluids and pain medicine. Will require admission to medicine with GI consultation.      Final Clinical Impression(s) / ED Diagnoses Final diagnoses:  Cholelithiasis with biliary obstruction      This chart was dictated using voice recognition software.  Despite best efforts to proofread,  errors can occur which can change the documentation meaning.    Nira Conn, MD 01/15/21 [phone]

## 2021-01-15 NOTE — ED Notes (Signed)
ED Provider at bedside. 

## 2021-01-15 NOTE — Consult Note (Signed)
Referring Provider: Dr. Cherylann Ratel  Primary Care Physician:  Trey Sailors, [place] Primary Gastroenterologist:  Dr. Scarlette Shorts  Reason for Consultation:  RUQ pain, elevated LFTs  HPI: Jacqueline Jones is a 30 y.o. female with a past medical history of anxiety, depression, PTSD, hypertension, cholesterolemia, headaches, epilepsy (last seizure was 1 1/2 months ago), asthma, hepatic steatosis, acid reflux and IBS symptoms.  She developed lower chest and epigastric pain on Monday, 01/13/2021 after eating sloppy Joe's and mozzarella cheese sticks.  She had some nausea without vomiting.  The upper abdominal pain radiated across to her right and left upper back.  She also noted a bulge below the sternum.  She also reported having nonbloody brown watery diarrhea for 2 days.  No fever.  She took aspirin x 2 without improvement.  Her symptoms worsen so she presented [facility] on 01/14/2021 for further evaluation labs in the ED showed an elevated Alk phosphatase level of 281. AST 643.  ALT 564.  Total bili 3.4.  Lipase 36.  BUN 9.  Creatinine 0.56.  WBC 7.7.  Hemoglobin 13.8.  Platelet 303.  SARS coronavirus 2 negative on 01/15/2021.  CTAP without contrast without evidence of acute intra-abdominal/pelvic pathology to explain her symptoms.  No gallstones or gallbladder wall thickening.  The liver was normal without miliary ductal dilatation.  She was transferred to [facility] for direct admission.  Nausea and diarrhea abated yesterday.  She continues to have lower sternal/epigastric pain.  No known history of gallstones.  She has a history of acid reflux which she describes as burning in her upper esophagus and throat for which she started taking Pantoprazole 40 mg once daily a few months ago with symptom relief.  She denies ever having an EGD.  History of alcohol use disorder from the age of 61 until around age 32.  No alcohol use for the past 2 years.  No drug use.  She smokes 3 to 4  cigarettes daily.  No recentTylenol use. She is on Keppra and Lamictal for her epilepsy, her Keppra dose was recently increased but she has been on both of these medications for several years.  She reported having a left stye infection for which she was prescribed doxycycline 3 times daily 1 week ago.  She took the Doxycycline for 2 to 3 days then discontinued it due to the onset of nausea.  Her sister had gallstones and required gallbladder surgery.  Mother died from non-small cell lung cancer.  Father died from cancer related to carcinogen exposure while in the [employer] in [place].  No known family history of liver disease.   She underwent surgery for endometriosis 2 or 3 years ago and she reported having seizures during surgery which was attributed to anesthesia.  Past Medical History:  Diagnosis Date   Anemia    Anxiety    Anxiety    Asthma    Depression    Depression    Elevated cholesterol    Frequent headaches    High blood pressure    IBS (irritable bowel syndrome)    Infection    UTI   Obesity    Pneumonia    Seizures ([place])    unknown cause- started 08/14  "monthly"   Sepsis ([place])    UTI (lower urinary tract infection)     Past Surgical History:  Procedure Laterality Date   COLONOSCOPY     age 30 - [place]  Prior to Admission medications   Medication Sig Start Date End Date Taking? Authorizing Provider  baclofen (LIORESAL) 10 MG tablet Take 1 tablet (10 mg total) by mouth 3 (three) times daily. Patient taking differently: Take 10 mg by mouth daily. 01/08/21  Yes Brunetta Jeans, PA-C  folic acid (FOLVITE) 1 MG tablet Take 1 mg by mouth daily. 07/25/20  Yes [provider]  lamoTRIgine (LAMICTAL) 200 MG tablet Take 200 mg by mouth 2 (two) times daily. 10/29/20  Yes [provider]  levETIRAcetam (KEPPRA) 1000 MG tablet Take 1,000-1,500 mg by mouth See admin instructions. Take 1,033m at AM and 1,5072mat PM 12/03/20  Yes  [provider]  ondansetron (ZOFRAN) 4 MG tablet Take 1 tablet (4 mg total) by mouth every 8 (eight) hours as needed for nausea or vomiting. 12/19/20  Yes Hawks, Christy A, FNP  pantoprazole (PROTONIX) 40 MG tablet Take 1 tablet (40 mg total) by mouth daily. 01/03/20  Yes GuWillia CrazeNP  guaiFENesin (ROBITUSSIN) 100 MG/5ML liquid Take 5-10 mLs (100-200 mg total) by mouth every 4 (four) hours as needed for cough. FOR CHEST CONGESTION AND PHLEGM Patient not taking: No sig reported 06/17/20   GiKinnie FeilPA-C  levETIRAcetam (KEPPRA) 750 MG tablet Take 1 tablet (750 mg total) by mouth 2 (two) times daily. Patient not taking: Reported on 01/15/2021 11/06/19   Clapacs, JoMadie RenoMD  melatonin 3 MG TABS tablet Take 1 tablet (3 mg total) by mouth at bedtime. Patient not taking: Reported on 01/15/2021 03/23/20   Swayze, Ava, DO    Current Facility-Administered Medications  Medication Dose Route Frequency Provider Last Rate Last Admin   0.9 %  sodium chloride infusion  1,000 mL Intravenous Continuous KyMarylyn IshiharaTyrone A, DO 125 mL/hr at 01/15/21 1145 1,000 mL at 01/15/21 1145   levETIRAcetam (KEPPRA) IVPB 1000 mg/100 mL premix  1,000 mg Intravenous q morning KyMarylyn IshiharaTyrone A, DO 400 mL/hr at 01/15/21 1147 1,000 mg at 01/15/21 1147   levETIRAcetam (KEPPRA) IVPB 1500 mg/ 100 mL premix  1,500 mg Intravenous QHS KyMarylyn IshiharaTyrone A, DO       morphine 4 MG/ML injection 4 mg  4 mg Intravenous Q4H PRN KyMarylyn IshiharaTyrone A, DO   4 mg at 01/15/21 1141   pantoprazole (PROTONIX) injection 40 mg  40 mg Intravenous Daily Kyle, Tyrone A, DO   40 mg at 01/15/21 1142   prochlorperazine (COMPAZINE) injection 10 mg  10 mg Intravenous Q6H PRN Kyle, Tyrone A, DO        Allergies as of 01/14/2021 - Review Complete 01/14/2021  Allergen Reaction Noted   Toradol [ketorolac tromethamine] Itching 10/02/2013   Clonazepam Other (See Comments) 09/19/2015   Flexeril [cyclobenzaprine] Other (See Comments) 07/10/2013   Gabapentin  Other (See Comments) 03/05/2013   Tramadol Itching 03/03/2013   Vicodin [hydrocodone-acetaminophen] Itching 03/03/2013   Adhesive [tape] Rash 01/09/2012   Azithromycin Rash 09/19/2015   Benzonatate Rash 09/19/2015   Keflet [cephalexin] Rash 08/12/2013   Latex Rash 01/09/2012   Levaquin [levofloxacin in d5w] Rash 08/12/2013    Family History  Problem Relation Age of Onset   Drug abuse Father    Cancer Father        age orange   Lung cancer Father    Depression Mother    Anxiety disorder Mother    Drug abuse Brother        incarcirated    Colon cancer Neg Hx     Social History  Socioeconomic History   Marital status: Married    Spouse name: Not on file   Number of children: 0   Years of education: Not on file   Highest education level: Not on file  Occupational History   Occupation: disable  Tobacco Use   Smoking status: Former    Packs/day: 0.25    Years: 10.00    Pack years: 2.50    Types: Cigarettes   Smokeless tobacco: Former   Tobacco comments:    she is wanting to quit trying  Vaping Use   Vaping Use: Never used  Substance and Sexual Activity   Alcohol use: No   Drug use: Not Currently    Frequency: 7.0 times per week   Sexual activity: Yes    Birth control/protection: None  Other Topics Concern   Not on file  Social History Narrative   ** Merged History Encounter **       Social Determinants of Health   Financial Resource Strain: Not on file  Food Insecurity: Not on file  Transportation Needs: Not on file  Physical Activity: Not on file  Stress: Not on file  Social Connections: Not on file  Intimate Partner Violence: Not on file    Review of Systems: Gen: Denies fever, sweats or chills. No weight loss.  CV: See HPI.  Resp: Denies cough, shortness of breath of hemoptysis.  GI: See HPI.   GU : Denies urinary burning, blood in urine, increased urinary frequency or incontinence. MS: Denies joint pain, muscles aches or weakness. Derm: Denies  rash, itchiness, skin lesions or unhealing ulcers. Psych: + Anxiety and depression.  Heme: Denies easy bruising, bleeding. Neuro:  Denies headaches, dizziness or paresthesias. Endo:  Denies any problems with DM, thyroid or adrenal function.  Physical Exam: Vital signs in last 24 hours: Temp:  [97.3 F (36.3 C)-98.4 F (36.9 C)] 98.2 F (36.8 C) (06/23 0958) Pulse Rate:  [70-103] 75 (06/23 0958) Resp:  [13-20] 16 (06/23 0958) BP: (107-137)/(67-94) 123/93 (06/23 0958) SpO2:  [96 %-100 %] 100 % (06/23 0958)   General:  Alert-year-old female in no acute distress. Head:  Normocephalic and atraumatic. Eyes: Very faint scleral icterus. Conjunctiva pink. Ears:  Normal auditory acuity. Nose:  No deformity, discharge or lesions. Mouth:  Dentition intact. No ulcers or lesions.  Neck:  Supple. No lymphadenopathy or thyromegaly.  Lungs: Breath sounds clear throughout. Heart: Regular rate and rhythm, no murmurs. Abdomen: Epigastric tenderness without rebound or guarding.  Positive bowel sounds all 4 quadrants.  No hepatosplenomegaly.  No mass. Rectal: Deferred. Musculoskeletal:  Symmetrical without gross deformities.  Pulses:  Normal pulses noted. Extremities:  Without clubbing or edema. Neurologic:  Alert and  oriented x4. No focal deficits.  Skin:  Intact without significant lesions or rashes.  No jaundice. Psych:  Alert and cooperative. Normal mood and affect.  Intake/Output from previous day: 06/22 0701 - 06/23 0700 In: 1000 [IV Piggyback:1000] Out: -  Intake/Output this shift: No intake/output data recorded.  Lab Results: Recent Labs    01/14/21 2310  WBC 7.7  HGB 13.8  HCT 42.4  PLT 303   BMET Recent Labs    01/14/21 2310  NA 139  K 3.5  CL 105  CO2 24  GLUCOSE 99  BUN 9  CREATININE 0.56  CALCIUM 9.3   LFT Recent Labs    01/14/21 2310  PROT 7.1  ALBUMIN 4.3  AST 643*  ALT 564*  ALKPHOS 281*  BILITOT 3.4*   PT/INR No  results for input(s): LABPROT,  INR in the last 72 hours. Hepatitis Panel No results for input(s): HEPBSAG, HCVAB, HEPAIGM, HEPBIGM in the last 72 hours.    Studies/Results: CT ABDOMEN PELVIS WO CONTRAST  Result Date: 01/15/2021 CLINICAL DATA:  Epigastric abdominal pain, periumbilical abdominal pain EXAM: CT ABDOMEN AND PELVIS WITHOUT CONTRAST TECHNIQUE: Multidetector CT imaging of the abdomen and pelvis was performed following the standard protocol without IV contrast. COMPARISON:  Right upper quadrant FINDINGS: Lower chest: The visualized lung bases are clear bilaterally. The visualized heart and pericardium are unremarkable. Small hiatal hernia. Hepatobiliary: No focal liver abnormality is seen. No gallstones, gallbladder wall thickening, or biliary dilatation. Pancreas: Unremarkable Spleen: Unremarkable Adrenals/Urinary Tract: Adrenal glands are unremarkable. Kidneys are normal, without renal calculi, focal lesion, or hydronephrosis. Bladder is unremarkable. Stomach/Bowel: Stomach is within normal limits. Appendix appears normal. No evidence of bowel wall thickening, distention, or inflammatory changes. No free intraperitoneal gas or fluid. Vascular/Lymphatic: No significant vascular findings are present. No enlarged abdominal or pelvic lymph nodes. Reproductive: Uterus and bilateral adnexa are unremarkable. Other: Tiny fat containing umbilical hernia is unchanged. Musculoskeletal: No acute bone abnormality. No lytic or blastic bone lesions identified. IMPRESSION: No acute intra-abdominal pathology identified. No definite radiographic explanation for the patient's reported symptoms. Electronically Signed   By: Fidela Salisbury MD   On: 01/15/2021 03:31    IMPRESSION/PLAN:  34.  30 year old female with lower sternal and epigastric pain and elevated LFTs consistent with acute cholestasis. CTAP without contrast showed a normal liver without evidence of choledocholithiasis or biliary ductal dilatation.  She is afebrile and  hemodynamically stable. -RUQ ordered by Dr. Marylyn Ishihara -May require abdominal MRI with MRCP with and without contrast, await RUQ results -Acute hepatitis panel, PT/INR, IgG level, SMA, AMA, ceruloplasmin, iron and ferritin levels.  -Clear liquid diet -Continue IV fluids  -Pain management per the hospitalist  2.  GERD -Pantoprazole 40 mg IV QD -Eventual EGD, outpatient vs inpatient   3.  History of epilepsy.  Last seizure activity was 1-1/2 months ago.  Patient reported having seizures during surgery for endometriosis 2 to 3 years ago she stated was triggered by anesthesia.  4. History of alcohol use disorder, abstinent from alcohol x 2 years  Await further recommendations from Dr. Phylliss Blakes Dorathy Daft  01/15/2021, 12:05 PM

## 2021-01-16 ENCOUNTER — Observation Stay (HOSPITAL_COMMUNITY): Payer: Medicaid Other

## 2021-01-16 ENCOUNTER — Encounter (HOSPITAL_COMMUNITY): Payer: Self-pay | Admitting: Anesthesiology

## 2021-01-16 DIAGNOSIS — R1013 Epigastric pain: Secondary | ICD-10-CM | POA: Diagnosis present

## 2021-01-16 DIAGNOSIS — E876 Hypokalemia: Secondary | ICD-10-CM | POA: Diagnosis present

## 2021-01-16 DIAGNOSIS — Z885 Allergy status to narcotic agent status: Secondary | ICD-10-CM | POA: Diagnosis not present

## 2021-01-16 DIAGNOSIS — K8020 Calculus of gallbladder without cholecystitis without obstruction: Secondary | ICD-10-CM | POA: Diagnosis present

## 2021-01-16 DIAGNOSIS — I10 Essential (primary) hypertension: Secondary | ICD-10-CM | POA: Diagnosis present

## 2021-01-16 DIAGNOSIS — F4310 Post-traumatic stress disorder, unspecified: Secondary | ICD-10-CM | POA: Diagnosis present

## 2021-01-16 DIAGNOSIS — Z91040 Latex allergy status: Secondary | ICD-10-CM | POA: Diagnosis not present

## 2021-01-16 DIAGNOSIS — K760 Fatty (change of) liver, not elsewhere classified: Secondary | ICD-10-CM | POA: Diagnosis present

## 2021-01-16 DIAGNOSIS — K580 Irritable bowel syndrome with diarrhea: Secondary | ICD-10-CM | POA: Diagnosis present

## 2021-01-16 DIAGNOSIS — Z20822 Contact with and (suspected) exposure to covid-19: Secondary | ICD-10-CM | POA: Diagnosis present

## 2021-01-16 DIAGNOSIS — Z888 Allergy status to other drugs, medicaments and biological substances status: Secondary | ICD-10-CM | POA: Diagnosis not present

## 2021-01-16 DIAGNOSIS — Z87440 Personal history of urinary (tract) infections: Secondary | ICD-10-CM | POA: Diagnosis not present

## 2021-01-16 DIAGNOSIS — J45909 Unspecified asthma, uncomplicated: Secondary | ICD-10-CM | POA: Diagnosis present

## 2021-01-16 DIAGNOSIS — E7800 Pure hypercholesterolemia, unspecified: Secondary | ICD-10-CM | POA: Diagnosis present

## 2021-01-16 DIAGNOSIS — R7989 Other specified abnormal findings of blood chemistry: Secondary | ICD-10-CM | POA: Diagnosis present

## 2021-01-16 DIAGNOSIS — Z881 Allergy status to other antibiotic agents status: Secondary | ICD-10-CM | POA: Diagnosis not present

## 2021-01-16 DIAGNOSIS — G40909 Epilepsy, unspecified, not intractable, without status epilepticus: Secondary | ICD-10-CM | POA: Diagnosis present

## 2021-01-16 DIAGNOSIS — F17210 Nicotine dependence, cigarettes, uncomplicated: Secondary | ICD-10-CM | POA: Diagnosis present

## 2021-01-16 DIAGNOSIS — R7401 Elevation of levels of liver transaminase levels: Secondary | ICD-10-CM | POA: Diagnosis present

## 2021-01-16 DIAGNOSIS — K219 Gastro-esophageal reflux disease without esophagitis: Secondary | ICD-10-CM | POA: Diagnosis present

## 2021-01-16 DIAGNOSIS — Z79899 Other long term (current) drug therapy: Secondary | ICD-10-CM | POA: Diagnosis not present

## 2021-01-16 LAB — CBC
HCT: 39.1 % (ref 36.0–46.0)
Hemoglobin: 12.3 g/dL (ref 12.0–15.0)
MCH: 27.6 pg (ref 26.0–34.0)
MCHC: 31.5 g/dL (ref 30.0–36.0)
MCV: 87.9 fL (ref 80.0–100.0)
Platelets: 228 10*3/uL (ref 150–400)
RBC: 4.45 MIL/uL (ref 3.87–5.11)
RDW: 14.4 % (ref 11.5–15.5)
WBC: 6.6 10*3/uL (ref 4.0–10.5)
nRBC: 0 % (ref 0.0–0.2)

## 2021-01-16 LAB — COMPREHENSIVE METABOLIC PANEL
ALT: 330 U/L — ABNORMAL HIGH (ref 0–44)
AST: 169 U/L — ABNORMAL HIGH (ref 15–41)
Albumin: 3.5 g/dL (ref 3.5–5.0)
Alkaline Phosphatase: 266 U/L — ABNORMAL HIGH (ref 38–126)
Anion gap: 8 (ref 5–15)
BUN: <5 mg/dL — ABNORMAL LOW (ref 6–20)
CO2: 22 mmol/L (ref 22–32)
Calcium: 8.4 mg/dL — ABNORMAL LOW (ref 8.9–10.3)
Chloride: 109 mmol/L (ref 98–111)
Creatinine, Ser: 0.48 mg/dL (ref 0.44–1.00)
GFR, Estimated: >60 mL/min (ref >60)
Glucose, Bld: 78 mg/dL (ref 70–99)
Potassium: 3.4 mmol/L — ABNORMAL LOW (ref 3.5–5.1)
Sodium: 139 mmol/L (ref 135–145)
Total Bilirubin: 1.3 mg/dL — ABNORMAL HIGH (ref 0.3–1.2)
Total Protein: 6.6 g/dL (ref 6.5–8.1)

## 2021-01-16 LAB — ANTI-SMOOTH MUSCLE ANTIBODY, IGG: F-Actin IgG: 4 Units (ref 0–19)

## 2021-01-16 LAB — BILIRUBIN, DIRECT: Bilirubin, Direct: 0.5 mg/dL — ABNORMAL HIGH (ref 0.0–0.2)

## 2021-01-16 LAB — HCV RNA QUANT: HCV Quantitative: NOT DETECTED IU/mL (ref >50)

## 2021-01-16 LAB — CERULOPLASMIN: Ceruloplasmin: 31.3 mg/dL (ref 19.0–39.0)

## 2021-01-16 LAB — MITOCHONDRIAL ANTIBODIES: Mitochondrial M2 Ab, IgG: <20 Units (ref 0.0–20.0)

## 2021-01-16 LAB — IGG: IgG (Immunoglobin G), Serum: 997 mg/dL (ref 586–1602)

## 2021-01-16 MED ORDER — LEVETIRACETAM 500 MG PO TABS: 1000.0000 mg | ORAL_TABLET | Freq: Every morning | ORAL | Status: DC

## 2021-01-16 MED ORDER — POTASSIUM CHLORIDE CRYS ER 20 MEQ PO TBCR
40.0000 meq | EXTENDED_RELEASE_TABLET | Freq: Once | ORAL | Status: AC
  Administered 2021-01-16: 40 meq via ORAL
  Filled 2021-01-16: qty 2

## 2021-01-16 MED ORDER — SODIUM CHLORIDE 0.9 % IV SOLN
1000.0000 mL | INTRAVENOUS | Status: DC
  Administered 2021-01-16: 1000 mL via INTRAVENOUS

## 2021-01-16 MED ORDER — LEVETIRACETAM IN NACL 1000 MG/100ML IV SOLN
1000.0000 mg | Freq: Every day | INTRAVENOUS | Status: DC
  Administered 2021-01-16: 1000 mg via INTRAVENOUS
  Filled 2021-01-16: qty 100

## 2021-01-16 MED ORDER — BACLOFEN 10 MG PO TABS
10.0000 mg | ORAL_TABLET | Freq: Every day | ORAL | Status: DC
  Administered 2021-01-16 – 2021-01-17 (×2): 10 mg via ORAL
  Filled 2021-01-16 (×2): qty 1

## 2021-01-16 MED ORDER — GADOBUTROL 1 MMOL/ML IV SOLN
9.0000 mL | Freq: Once | INTRAVENOUS | Status: AC | PRN
  Administered 2021-01-16: 9 mL via INTRAVENOUS

## 2021-01-16 MED ORDER — LEVETIRACETAM 500 MG PO TABS: 1500.0000 mg | ORAL_TABLET | Freq: Every evening | ORAL | Status: DC

## 2021-01-16 MED ORDER — SODIUM CHLORIDE 0.9 % IV SOLN
2.0000 g | INTRAVENOUS | Status: AC
  Administered 2021-01-17: 2 g via INTRAVENOUS
  Filled 2021-01-16: qty 20

## 2021-01-16 MED ORDER — LEVETIRACETAM IN NACL 1500 MG/100ML IV SOLN
1500.0000 mg | Freq: Every morning | INTRAVENOUS | Status: DC
  Administered 2021-01-17: 1500 mg via INTRAVENOUS
  Filled 2021-01-16: qty 100

## 2021-01-16 NOTE — Plan of Care (Signed)
  Problem: Education: Goal: Knowledge of General Education information will improve Description: Including pain rating scale, medication(s)/side effects and non-pharmacologic comfort measures Outcome: Progressing   Problem: Health Behavior/Discharge Planning: Goal: Ability to manage health-related needs will improve Outcome: Progressing   Problem: Clinical Measurements: Goal: Will remain free from infection Outcome: Progressing   

## 2021-01-16 NOTE — Consult Note (Signed)
   Jacqueline Jones 07/17/1991  [id number].    Requesting MD: Dr. Armbruster Chief Complaint/Reason for Consult: Cholelithiasis   HPI: Jacqueline Jones is a 30-year-old female with a history of hypertension, epilepsy, asthma and GERD who presented with epigastric pain.  Patient reports that on 6/21 she began having epigastric abdominal pain with radiation to her right flank with associated nausea and vomiting after eating sloppy joe's and mozzarella sticks.  She tried aspirin for this without any improvement.  She notes that the pain is moderate in severity and has been intermittent since onset.  She denies history of similar pain in the past.  No associated fever or current chest pain, shortness of breath or urinary symptoms. She presented to the ED for evaluation where she was noted to have elevated LFTs, normal WBC.  CT showed no acute intra-abdominal pathology.  Right upper quadrant ultrasound showed cholelithiasis without cholecystitis.  CBD 6.3 mm.  GI consulted and planning for MRCP today.  LFTs downtrending today.  Prior abdominal surgery: She reports that she had surgery for endometriosis 2-3 years ago and had seizures after anesthesia  ROS: Review of Systems  Constitutional:  Negative for chills and fever.  Respiratory:  Negative for shortness of breath.   Cardiovascular:  Negative for chest pain and leg swelling.  Gastrointestinal:  Positive for abdominal pain, diarrhea, nausea and vomiting.  Genitourinary:  Positive for hematuria.  Musculoskeletal:  Positive for back pain.  Psychiatric/Behavioral:  Negative for substance abuse.   All other systems reviewed and are negative.  Family History  Problem Relation Age of Onset   Drug abuse Father    Cancer Father        age orange   Lung cancer Father    Depression Mother    Anxiety disorder Mother    Drug abuse Brother        incarcirated    Colon cancer Neg Hx     Past Medical History:  Diagnosis Date   Anemia     Anxiety    Anxiety    Asthma    Depression    Depression    Elevated cholesterol    Frequent headaches    High blood pressure    IBS (irritable bowel syndrome)    Infection    UTI   Obesity    Pneumonia    Seizures (HCC)    unknown cause- started 08/14  "monthly"   Sepsis (HCC)    UTI (lower urinary tract infection)     Past Surgical History:  Procedure Laterality Date   COLONOSCOPY     age 15 - [place]   DIAGNOSTIC LAPAROSCOPY      Social History:  reports that she has quit smoking. Her smoking use included cigarettes. She has a 2.50 pack-year smoking history. She has quit using smokeless tobacco. She reports previous drug use. Frequency: 7.00 times per week. She reports that she does not drink alcohol. Alcohol use: Reports alcohol use since age of 10.  Recently quit in the last few months Tobacco use: Smokes Black and mild daily Illicit drug use: None Employment: None, on disability Married  Allergies:  Allergies  Allergen Reactions   Toradol [Ketorolac Tromethamine] Itching    seizure   Clonazepam Other (See Comments)    Seizure   Flexeril [Cyclobenzaprine] Other (See Comments)    seizure   Gabapentin Other (See Comments)    seizures    Tramadol Itching    Seizures   Vicodin [Hydrocodone-Acetaminophen] Itching      Pt states she can take Tylenol without difficulty.   Adhesive [Tape] Rash   Azithromycin Rash   Benzonatate Rash   Keflet [Cephalexin] Rash    Did it involve swelling of the face/tongue/throat, SOB, or low BP? No Did it involve sudden or severe rash/hives, skin peeling, or any reaction on the inside of your mouth or nose? No Did you need to seek medical attention at a hospital or doctor's office? No When did it last happen?       If all above answers are "NO", may proceed with cephalosporin use.    Latex Rash   Levaquin [Levofloxacin In D5w] Rash    Medications Prior to Admission  Medication Sig Dispense Refill   baclofen (LIORESAL) 10  MG tablet Take 1 tablet (10 mg total) by mouth 3 (three) times daily. (Patient taking differently: Take 10 mg by mouth daily.) 30 each 0   folic acid (FOLVITE) 1 MG tablet Take 1 mg by mouth daily.     lamoTRIgine (LAMICTAL) 200 MG tablet Take 200 mg by mouth 2 (two) times daily.     levETIRAcetam (KEPPRA) 1000 MG tablet Take 1,000-1,500 mg by mouth See admin instructions. Take 1,000mg at AM and 1,500mg at PM     ondansetron (ZOFRAN) 4 MG tablet Take 1 tablet (4 mg total) by mouth every 8 (eight) hours as needed for nausea or vomiting. 20 tablet 0   pantoprazole (PROTONIX) 40 MG tablet Take 1 tablet (40 mg total) by mouth daily. 30 tablet 3   guaiFENesin (ROBITUSSIN) 100 MG/5ML liquid Take 5-10 mLs (100-200 mg total) by mouth every 4 (four) hours as needed for cough. FOR CHEST CONGESTION AND PHLEGM (Patient not taking: No sig reported) 60 mL 0   levETIRAcetam (KEPPRA) 750 MG tablet Take 1 tablet (750 mg total) by mouth 2 (two) times daily. (Patient not taking: Reported on 01/15/2021) 60 tablet 0   melatonin 3 MG TABS tablet Take 1 tablet (3 mg total) by mouth at bedtime. (Patient not taking: Reported on 01/15/2021) 30 tablet 0     Physical Exam: Blood pressure (P) 131/81, pulse (P) 88, temperature (P) 98.9 F (37.2 C), temperature source (P) Oral, resp. rate (P) 18, SpO2 (P) 99 %. General: pleasant, WD/WN white female who is laying in bed in NAD HEENT: head is normocephalic, atraumatic.  Sclera are noninjected.  PERRL.  Ears and nose without any masses or lesions.  Mouth is pink and moist. Dentition fair Heart: regular, rate, and rhythm.  Normal s1,s2. No obvious murmurs, gallops, or rubs noted.  Palpable pedal pulses bilaterally  Lungs: CTAB, no wheezes, rhonchi, or rales noted.  Respiratory effort nonlabored Abd: Soft, ND, tenderness of the epigastrium without peritonitis, +BS, no masses, hernias, or organomegaly MS: no BUE/BLE edema, calves soft and nontender Skin: warm and dry with no masses,  lesions, or rashes Psych: A&Ox4 with an appropriate affect Neuro: cranial nerves grossly intact, equal strength in BUE/BLE bilaterally, normal speech, thought process intact, moves all extremities, gait not assessed.   Results for orders placed or performed during the hospital encounter of 01/15/21 (from the past 48 hour(s))  Urinalysis, Routine w reflex microscopic     Status: Abnormal   Collection Time: 01/14/21 11:09 PM  Result Value Ref Range   Color, Urine YELLOW YELLOW   APPearance HAZY (A) CLEAR   Specific Gravity, Urine 1.030 1.005 - 1.030   pH 6.0 5.0 - 8.0   Glucose, UA NEGATIVE NEGATIVE mg/dL   Hgb urine dipstick LARGE (  A) NEGATIVE   Bilirubin Urine LARGE (A) NEGATIVE   Ketones, ur NEGATIVE NEGATIVE mg/dL   Protein, ur 30 (A) NEGATIVE mg/dL   Nitrite NEGATIVE NEGATIVE   Leukocytes,Ua SMALL (A) NEGATIVE   RBC / HPF >50 (H) 0 - 5 RBC/hpf   WBC, UA 21-50 0 - 5 WBC/hpf   Squamous Epithelial / LPF 0-5 0 - 5   Mucus PRESENT     Comment: Performed at [facility], [address], [place], [place] [address]  Pregnancy, urine     Status: None   Collection Time: 01/14/21 11:09 PM  Result Value Ref Range   Preg Test, Ur NEGATIVE NEGATIVE    Comment:        THE SENSITIVITY OF THIS METHODOLOGY IS >20 mIU/mL. Performed at [facility], [address], [place], [place] [address]   Lipase, blood     Status: None   Collection Time: 01/14/21 11:10 PM  Result Value Ref Range   Lipase 36 11 - 51 U/L    Comment: Performed at [facility], [address], [place], [place] [address]  Comprehensive metabolic panel     Status: Abnormal   Collection Time: 01/14/21 11:10 PM  Result Value Ref Range   Sodium 139 135 - 145 mmol/L   Potassium 3.5 3.5 - 5.1 mmol/L   Chloride 105 98 - 111 mmol/L   CO2 24 22 - 32 mmol/L   Glucose, Bld 99 70 - 99 mg/dL    Comment: Glucose reference range applies only to samples taken after fasting for  at least 8 hours.   BUN 9 6 - 20 mg/dL   Creatinine, Ser 0.56 0.44 - 1.00 mg/dL   Calcium 9.3 8.9 - 10.3 mg/dL   Total Protein 7.1 6.5 - 8.1 g/dL   Albumin 4.3 3.5 - 5.0 g/dL   AST 643 (H) 15 - 41 U/L   ALT 564 (H) 0 - 44 U/L    Comment: RESULTS CONFIRMED BY MANUAL DILUTION   Alkaline Phosphatase 281 (H) 38 - 126 U/L   Total Bilirubin 3.4 (H) 0.3 - 1.2 mg/dL   GFR, Estimated >60 >60 mL/min    Comment: (NOTE) Calculated using the CKD-EPI Creatinine Equation (2021)    Anion gap 10 5 - 15    Comment: Performed at [facility], [address], [place], [place] [address]  CBC     Status: None   Collection Time: 01/14/21 11:10 PM  Result Value Ref Range   WBC 7.7 4.0 - 10.5 K/uL   RBC 5.04 3.87 - 5.11 MIL/uL   Hemoglobin 13.8 12.0 - 15.0 g/dL   HCT 42.4 36.0 - 46.0 %   MCV 84.1 80.0 - 100.0 fL   MCH 27.4 26.0 - 34.0 pg   MCHC 32.5 30.0 - 36.0 g/dL   RDW 14.5 11.5 - 15.5 %   Platelets 303 150 - 400 K/uL   nRBC 0.0 0.0 - 0.2 %    Comment: Performed at [facility], [address], [place], [place] [address]  Resp Panel by RT-PCR (Flu A&B, Covid) Nasopharyngeal Swab     Status: None   Collection Time: 01/15/21  3:30 AM   Specimen: Nasopharyngeal Swab; Nasopharyngeal(NP) swabs in vial transport medium  Result Value Ref Range   SARS Coronavirus 2 by RT PCR NEGATIVE NEGATIVE    Comment: (NOTE) SARS-CoV-2 target nucleic acids are NOT DETECTED.  The SARS-CoV-2 RNA is generally detectable in upper respiratory specimens during the acute phase   of infection. The lowest concentration of SARS-CoV-2 viral copies this assay can detect is 138 copies/mL. A negative result does not preclude SARS-Cov-2 infection and should not be used as the sole basis for treatment or other patient management decisions. A negative result may occur with  improper specimen collection/handling, submission of specimen other than nasopharyngeal swab, presence of viral  mutation(s) within the areas targeted by this assay, and inadequate number of viral copies(<138 copies/mL). A negative result must be combined with clinical observations, patient history, and epidemiological information. The expected result is Negative.  Fact Sheet for Patients:  https://www.fda.gov/media/152166/download  Fact Sheet for Healthcare Providers:  https://www.fda.gov/media/152162/download  This test is no t yet approved or cleared by the [place] FDA and  has been authorized for detection and/or diagnosis of SARS-CoV-2 by FDA under an Emergency Use Authorization (EUA). This EUA will remain  in effect (meaning this test can be used) for the duration of the COVID-19 declaration under Section 564(b)(1) of the Act, 21 U.S.C.section 360bbb-3(b)(1), unless the authorization is terminated  or revoked sooner.       Influenza A by PCR NEGATIVE NEGATIVE   Influenza B by PCR NEGATIVE NEGATIVE    Comment: (NOTE) The Xpert Xpress SARS-CoV-2/FLU/RSV plus assay is intended as an aid in the diagnosis of influenza from Nasopharyngeal swab specimens and should not be used as a sole basis for treatment. Nasal washings and aspirates are unacceptable for Xpert Xpress SARS-CoV-2/FLU/RSV testing.  Fact Sheet for Patients: https://www.fda.gov/media/152166/download  Fact Sheet for Healthcare Providers: https://www.fda.gov/media/152162/download  This test is not yet approved or cleared by the [place] FDA and has been authorized for detection and/or diagnosis of SARS-CoV-2 by FDA under an Emergency Use Authorization (EUA). This EUA will remain in effect (meaning this test can be used) for the duration of the COVID-19 declaration under Section 564(b)(1) of the Act, 21 U.S.C. section 360bbb-3(b)(1), unless the authorization is terminated or revoked.  Performed at [facility], [address], [place], [place] [address]   Comprehensive metabolic panel      Status: Abnormal   Collection Time: 01/15/21 12:12 PM  Result Value Ref Range   Sodium 138 135 - 145 mmol/L   Potassium 3.5 3.5 - 5.1 mmol/L   Chloride 110 98 - 111 mmol/L   CO2 19 (L) 22 - 32 mmol/L   Glucose, Bld 82 70 - 99 mg/dL    Comment: Glucose reference range applies only to samples taken after fasting for at least 8 hours.   BUN 6 6 - 20 mg/dL   Creatinine, Ser 0.42 (L) 0.44 - 1.00 mg/dL   Calcium 8.2 (L) 8.9 - 10.3 mg/dL   Total Protein 6.7 6.5 - 8.1 g/dL   Albumin 3.6 3.5 - 5.0 g/dL   AST 380 (H) 15 - 41 U/L   ALT 454 (H) 0 - 44 U/L   Alkaline Phosphatase 276 (H) 38 - 126 U/L   Total Bilirubin 2.5 (H) 0.3 - 1.2 mg/dL   GFR, Estimated >60 >60 mL/min    Comment: (NOTE) Calculated using the CKD-EPI Creatinine Equation (2021)    Anion gap 9 5 - 15    Comment: Performed at [facility], [address] [place], [place] [address]  CBC with Differential/Platelet     Status: None   Collection Time: 01/15/21 12:12 PM  Result Value Ref Range   WBC 6.9 4.0 - 10.5 K/uL   RBC 4.57 3.87 - 5.11 MIL/uL   Hemoglobin 12.5 12.0 - 15.0   g/dL   HCT 40.2 36.0 - 46.0 %   MCV 88.0 80.0 - 100.0 fL   MCH 27.4 26.0 - 34.0 pg   MCHC 31.1 30.0 - 36.0 g/dL   RDW 14.6 11.5 - 15.5 %   Platelets 225 150 - 400 K/uL   nRBC 0.0 0.0 - 0.2 %   Neutrophils Relative % 65 %   Neutro Abs 4.5 1.7 - 7.7 K/uL   Lymphocytes Relative 24 %   Lymphs Abs 1.6 0.7 - 4.0 K/uL   Monocytes Relative 6 %   Monocytes Absolute 0.4 0.1 - 1.0 K/uL   Eosinophils Relative 4 %   Eosinophils Absolute 0.3 0.0 - 0.5 K/uL   Basophils Relative 1 %   Basophils Absolute 0.1 0.0 - 0.1 K/uL   Immature Granulocytes 0 %   Abs Immature Granulocytes 0.02 0.00 - 0.07 K/uL    Comment: Performed at [facility], [address] [place], [place] [address]  Hepatitis panel, acute     Status: None   Collection Time: 01/15/21 12:37 PM  Result Value Ref Range   Hepatitis B Surface Ag NON REACTIVE NON  REACTIVE   HCV Ab NON REACTIVE NON REACTIVE    Comment: (NOTE) Nonreactive HCV antibody screen is consistent with no HCV infections,  unless recent infection is suspected or other evidence exists to indicate HCV infection.     Hep A IgM NON REACTIVE NON REACTIVE   Hep B C IgM NON REACTIVE NON REACTIVE    Comment: Performed at [facility], [address] [place], [place] [address]  Protime-INR     Status: None   Collection Time: 01/15/21 12:37 PM  Result Value Ref Range   Prothrombin Time 12.9 11.4 - 15.2 seconds   INR 1.0 0.8 - 1.2    Comment: (NOTE) INR goal varies based on device and disease states. Performed at [facility], [address] [place], [place] [address]   IgG     Status: None   Collection Time: 01/15/21 12:37 PM  Result Value Ref Range   IgG (Immunoglobin G), Serum 997 586 - 1,602 mg/dL    Comment: (NOTE) Performed At: [facility] [address] [place], [place] [id number] Nagendra Sanjai MD Ph:[phone]   Ceruloplasmin     Status: None   Collection Time: 01/15/21 12:37 PM  Result Value Ref Range   Ceruloplasmin 31.3 19.0 - 39.0 mg/dL    Comment: (NOTE) Performed At: [facility] [place] [address] , [place] [id number] Nagendra Sanjai MD Ph:[phone]   Iron     Status: Abnormal   Collection Time: 01/15/21 12:37 PM  Result Value Ref Range   Iron 180 (H) 28 - 170 ug/dL    Comment: Performed at [facility], [address] [place], [place] [address]  Ferritin     Status: None   Collection Time: 01/15/21 12:37 PM  Result Value Ref Range   Ferritin 21 11 - 307 ng/mL    Comment: Performed at [facility], [address] , [place] [address]  Comprehensive metabolic panel     Status: Abnormal   Collection Time: 01/16/21  5:07 AM  Result Value Ref Range   Sodium 139 135 - 145 mmol/L   Potassium 3.4 (L) 3.5 - 5.1 mmol/L   Chloride 109 98 - 111 mmol/L   CO2 22 22  - 32 mmol/L   Glucose, Bld 78 70 - 99 mg/dL    Comment:   Glucose reference range applies only to samples taken after fasting for at least 8 hours.   BUN <5 (L) 6 - 20 mg/dL   Creatinine, Ser 0.48 0.44 - 1.00 mg/dL   Calcium 8.4 (L) 8.9 - 10.3 mg/dL   Total Protein 6.6 6.5 - 8.1 g/dL   Albumin 3.5 3.5 - 5.0 g/dL   AST 169 (H) 15 - 41 U/L   ALT 330 (H) 0 - 44 U/L   Alkaline Phosphatase 266 (H) 38 - 126 U/L   Total Bilirubin 1.3 (H) 0.3 - 1.2 mg/dL   GFR, Estimated >60 >60 mL/min    Comment: (NOTE) Calculated using the CKD-EPI Creatinine Equation (2021)    Anion gap 8 5 - 15    Comment: Performed at [facility], [address] [place], [place] [address]  CBC     Status: None   Collection Time: 01/16/21  5:07 AM  Result Value Ref Range   WBC 6.6 4.0 - 10.5 K/uL   RBC 4.45 3.87 - 5.11 MIL/uL   Hemoglobin 12.3 12.0 - 15.0 g/dL   HCT 39.1 36.0 - 46.0 %   MCV 87.9 80.0 - 100.0 fL   MCH 27.6 26.0 - 34.0 pg   MCHC 31.5 30.0 - 36.0 g/dL   RDW 14.4 11.5 - 15.5 %   Platelets 228 150 - 400 K/uL   nRBC 0.0 0.0 - 0.2 %    Comment: Performed at [facility], [address] [place], [place] [address]  Bilirubin, direct     Status: Abnormal   Collection Time: 01/16/21  5:07 AM  Result Value Ref Range   Bilirubin, Direct 0.5 (H) 0.0 - 0.2 mg/dL    Comment: Performed at [facility], [address] [place], [place] [address]   CT ABDOMEN PELVIS WO CONTRAST  Result Date: 01/15/2021 CLINICAL DATA:  Epigastric abdominal pain, periumbilical abdominal pain EXAM: CT ABDOMEN AND PELVIS WITHOUT CONTRAST TECHNIQUE: Multidetector CT imaging of the abdomen and pelvis was performed following the standard protocol without IV contrast. COMPARISON:  Right upper quadrant FINDINGS: Lower chest: The visualized lung bases are clear bilaterally. The visualized heart and pericardium are unremarkable. Small hiatal hernia. Hepatobiliary: No focal liver  abnormality is seen. No gallstones, gallbladder wall thickening, or biliary dilatation. Pancreas: Unremarkable Spleen: Unremarkable Adrenals/Urinary Tract: Adrenal glands are unremarkable. Kidneys are normal, without renal calculi, focal lesion, or hydronephrosis. Bladder is unremarkable. Stomach/Bowel: Stomach is within normal limits. Appendix appears normal. No evidence of bowel wall thickening, distention, or inflammatory changes. No free intraperitoneal gas or fluid. Vascular/Lymphatic: No significant vascular findings are present. No enlarged abdominal or pelvic lymph nodes. Reproductive: Uterus and bilateral adnexa are unremarkable. Other: Tiny fat containing umbilical hernia is unchanged. Musculoskeletal: No acute bone abnormality. No lytic or blastic bone lesions identified. IMPRESSION: No acute intra-abdominal pathology identified. No definite radiographic explanation for the patient's reported symptoms. Electronically Signed   By: Ashesh  Parikh MD   On: 01/15/2021 03:31   US Abdomen Limited RUQ (LIVER/GB)  Result Date: 01/15/2021 CLINICAL DATA:  Epigastric pain and elevated LFTs EXAM: ULTRASOUND ABDOMEN LIMITED RIGHT UPPER QUADRANT COMPARISON:  CT from earlier in the same day. FINDINGS: Gallbladder: Gallbladder is well distended. Small gallstones are noted. No wall thickening or pericholecystic fluid is seen. Common bile duct: Diameter: 6.3 mm. This is at the upper limits of normal in size for the patient's age. Liver: No focal lesion identified. Within normal limits in parenchymal echogenicity. Portal vein is   patent on color Doppler imaging with normal direction of blood flow towards the liver. Other: None. IMPRESSION: Cholelithiasis without complicating factors. Electronically Signed   By: Mark  Lukens M.D.   On: 01/15/2021 16:43    Anti-infectives (From admission, onward)    None        Assessment/Plan Symptomatic Cholelithiasis  Elevated LFT's with possible Choledocholithiasis  - Plan  for Lap Chole during admission. Timing pending MRCP results to determine if patient will need ERCP pre-op - [place] has hx of seizures with anesthesia. When able to determine timing of Lap Chole, will discuss with anesthesia to see what changes can be made to decrease this risk. She has been compliant with her seizure medications. - WBC wnl and no evidence of cholecystitis on imaging. No abx indicated at this time - Continue to trend labs - We will follow with you  FEN - Currently on CLD VTE - SCDs, per primary ID - None  GERD Epilepsy   Idara Woodside M Encarnacion Bole, PA-C [facility] 01/16/2021, 10:13 AM Please see Amion for pager number during day hours 7:00am-4:30pm  

## 2021-01-16 NOTE — Progress Notes (Addendum)
Update -MRCP results reviewed. No choledocholithiasis -Will plan for Lap Chole tomorrow -Discussed with Pharmacy about patient abx choice given allergy to cephalosporin (rash). Normally the next choice would be Cipro but patient has pop up when ordering this alerting that she has a diagnosis of prolonged QT which would place patient at risk for Torsades. Pharmacy feels Rocephin would be the best option. Ordered for on call to the OR -Discussed with anesthesia about patients hx of epilepsy and prior seizure when undergoing anesthesia in the past. They are aware and will make the anesthesiologist tomorrow aware as well - NPO at midnight  - AM labs  Leary Roca, PA-C

## 2021-01-16 NOTE — Progress Notes (Signed)
[facility]  PROGRESS NOTE  Jacqueline Jones NWG:[id number] DOB: 02-19-1991 DOA: 01/15/2021 PCP: Norm Salt, PA   Brief HPI:   30 year old female with medical history of GERD, epilepsy presented with abdominal pain.  It was associated with nausea and vomiting. In the ED she was found to have transaminitis, CT abdomen/pelvis was unremarkable. Gastroenterology was consulted   Subjective   Patient seen and examined, right upper quadrant ultrasound showed cholelithiasis without complicating factors.  She denies nausea and vomiting this morning.   Assessment/Plan:     Gallstones -MRCP abdomen obtained today shows tiny gallstones in the dependent gallbladder -No biliary ductal dilatation, no evidence of filling defect to the ampulla -General surgery is planning to do cholecystectomy  Transaminitis -LFTs on presentation was significantly elevated, AST/ALT 643/564; today improved to 169/330 respectively -MRCP shows no evidence of filling defect in the common bile duct -Likely she has passed a stone -Gastroenterology has signed off  Hypokalemia -Potassium is 3.4 -Replace potassium and follow BMP in am   Epilepsy -Continue Lamictal 200 mg p.o. twice daily - continue IV keppra 1500 mg q am; 1000 mg q pm  Scheduled medications:    baclofen  10 mg Oral Daily   lamoTRIgine  200 mg Oral BID   pantoprazole (PROTONIX) IV  40 mg Intravenous Daily         Data Reviewed:   CBG:  No results for input(s): GLUCAP in the last 168 hours.  SpO2: 98 %    Vitals:   01/15/21 2220 01/16/21 0500 01/16/21 1000 01/16/21 1325  BP: (!) 152/105 (P) 131/81  131/85  Pulse: (!) 107 (P) 88  88  Resp: 20 (P) 18  16  Temp: 98.4 F (36.9 C) (P) 98.9 F (37.2 C)  97.9 F (36.6 C)  TempSrc:  (P) Oral  Oral  SpO2: 100% (P) 99%  98%  Weight:   92.5 kg   Height:   5' (1.524 m)      Intake/Output Summary (Last 24 hours) at 01/16/2021 1545 Last data filed at 01/16/2021  1300 Gross per 24 hour  Intake 120 ml  Output 3100 ml  Net -2980 ml    06/22 1901 - 06/24 0700 In: 1120 [P.O.:120] Out: 1400 [Urine:1400]  Filed Weights   01/16/21 1000  Weight: 92.5 kg    CBC:  Recent Labs  Lab 01/14/21 2310 01/15/21 1212 01/16/21 0507  WBC 7.7 6.9 6.6  HGB 13.8 12.5 12.3  HCT 42.4 40.2 39.1  PLT 303 225 228  MCV 84.1 88.0 87.9  MCH 27.4 27.4 27.6  MCHC 32.5 31.1 31.5  RDW 14.5 14.6 14.4  LYMPHSABS  --  1.6  --   MONOABS  --  0.4  --   EOSABS  --  0.3  --   BASOSABS  --  0.1  --     Complete metabolic panel:  Recent Labs  Lab 01/14/21 2310 01/15/21 1212 01/15/21 1237 01/16/21 0507  NA 139 138  --  139  K 3.5 3.5  --  3.4*  CL 105 110  --  109  CO2 24 19*  --  22  GLUCOSE 99 82  --  78  BUN 9 6  --  <5*  CREATININE 0.56 0.42*  --  0.48  CALCIUM 9.3 8.2*  --  8.4*  AST 643* 380*  --  169*  ALT 564* 454*  --  330*  ALKPHOS 281* 276*  --  266*  BILITOT 3.4* 2.5*  --  1.3*  ALBUMIN 4.3 3.6  --  3.5  INR  --   --  1.0  --     Recent Labs  Lab 01/14/21 2310  LIPASE 36    Recent Labs  Lab 01/15/21 0330  SARSCOV2NAA NEGATIVE    ------------------------------------------------------------------------------------------------------------------ No results for input(s): CHOL, HDL, LDLCALC, TRIG, CHOLHDL, LDLDIRECT in the last 72 hours.  Lab Results  Component Value Date   HGBA1C 5.6 11/04/2019   ------------------------------------------------------------------------------------------------------------------ No results for input(s): TSH, T4TOTAL, T3FREE, THYROIDAB in the last 72 hours.  Invalid input(s): FREET3 ------------------------------------------------------------------------------------------------------------------ Recent Labs    01/15/21 1237  FERRITIN 21  IRON 180*    Coagulation profile Recent Labs  Lab 01/15/21 1237  INR 1.0   No results for input(s): DDIMER in the last 72 hours.  Cardiac Enzymes No  results for input(s): CKTOTAL, CKMB, CKMBINDEX, TROPONINI in the last 168 hours.  ------------------------------------------------------------------------------------------------------------------ No results found for: BNP   Antibiotics: Anti-infectives (From admission, onward)    None        Radiology Reports  CT ABDOMEN PELVIS WO CONTRAST  Result Date: 01/15/2021 CLINICAL DATA:  Epigastric abdominal pain, periumbilical abdominal pain EXAM: CT ABDOMEN AND PELVIS WITHOUT CONTRAST TECHNIQUE: Multidetector CT imaging of the abdomen and pelvis was performed following the standard protocol without IV contrast. COMPARISON:  Right upper quadrant FINDINGS: Lower chest: The visualized lung bases are clear bilaterally. The visualized heart and pericardium are unremarkable. Small hiatal hernia. Hepatobiliary: No focal liver abnormality is seen. No gallstones, gallbladder wall thickening, or biliary dilatation. Pancreas: Unremarkable Spleen: Unremarkable Adrenals/Urinary Tract: Adrenal glands are unremarkable. Kidneys are normal, without renal calculi, focal lesion, or hydronephrosis. Bladder is unremarkable. Stomach/Bowel: Stomach is within normal limits. Appendix appears normal. No evidence of bowel wall thickening, distention, or inflammatory changes. No free intraperitoneal gas or fluid. Vascular/Lymphatic: No significant vascular findings are present. No enlarged abdominal or pelvic lymph nodes. Reproductive: Uterus and bilateral adnexa are unremarkable. Other: Tiny fat containing umbilical hernia is unchanged. Musculoskeletal: No acute bone abnormality. No lytic or blastic bone lesions identified. IMPRESSION: No acute intra-abdominal pathology identified. No definite radiographic explanation for the patient's reported symptoms. Electronically Signed   By: Helyn Numbers MD   On: 01/15/2021 03:31   MR 3D Recon At Scanner  Result Date: 01/16/2021 CLINICAL DATA:  Abdominal pain, gallstones, elevated  LFTs, biliary obstruction suspected EXAM: MRI ABDOMEN WITHOUT AND WITH CONTRAST (INCLUDING MRCP) TECHNIQUE: Multiplanar multisequence MR imaging of the abdomen was performed both before and after the administration of intravenous contrast. Heavily T2-weighted images of the biliary and pancreatic ducts were obtained, and three-dimensional MRCP images were rendered by post processing. CONTRAST:  41mL GADAVIST GADOBUTROL 1 MMOL/ML IV SOLN COMPARISON:  CT abdomen pelvis, 01/15/2021 FINDINGS: Lower chest: No acute findings. Hepatobiliary: No mass or other parenchymal abnormality identified. Tiny gallstones in the dependent gallbladder. No gallbladder wall thickening or pericholecystic fluid. No biliary ductal dilatation. No evidence of filling defect to the ampulla. Pancreas: No mass, inflammatory changes, or other parenchymal abnormality identified. Spleen:  Within normal limits in size and appearance. Adrenals/Urinary Tract: No masses identified. No evidence of hydronephrosis. Stomach/Bowel: Visualized portions within the abdomen are unremarkable. Vascular/Lymphatic: No pathologically enlarged lymph nodes identified. No abdominal aortic aneurysm demonstrated. Other:  None. Musculoskeletal: No suspicious bone lesions identified. IMPRESSION: 1. Tiny gallstones in the dependent gallbladder. No gallbladder wall thickening or pericholecystic fluid. 2. No biliary ductal dilatation. No evidence of filling defect to the ampulla. 3. No acute findings in the abdomen. Electronically  Signed   By: Lauralyn PrimesAlex  Bibbey M.D.   On: 01/16/2021 11:28   MR ABDOMEN MRCP W WO CONTAST  Result Date: 01/16/2021 CLINICAL DATA:  Abdominal pain, gallstones, elevated LFTs, biliary obstruction suspected EXAM: MRI ABDOMEN WITHOUT AND WITH CONTRAST (INCLUDING MRCP) TECHNIQUE: Multiplanar multisequence MR imaging of the abdomen was performed both before and after the administration of intravenous contrast. Heavily T2-weighted images of the biliary and  pancreatic ducts were obtained, and three-dimensional MRCP images were rendered by post processing. CONTRAST:  9mL GADAVIST GADOBUTROL 1 MMOL/ML IV SOLN COMPARISON:  CT abdomen pelvis, 01/15/2021 FINDINGS: Lower chest: No acute findings. Hepatobiliary: No mass or other parenchymal abnormality identified. Tiny gallstones in the dependent gallbladder. No gallbladder wall thickening or pericholecystic fluid. No biliary ductal dilatation. No evidence of filling defect to the ampulla. Pancreas: No mass, inflammatory changes, or other parenchymal abnormality identified. Spleen:  Within normal limits in size and appearance. Adrenals/Urinary Tract: No masses identified. No evidence of hydronephrosis. Stomach/Bowel: Visualized portions within the abdomen are unremarkable. Vascular/Lymphatic: No pathologically enlarged lymph nodes identified. No abdominal aortic aneurysm demonstrated. Other:  None. Musculoskeletal: No suspicious bone lesions identified. IMPRESSION: 1. Tiny gallstones in the dependent gallbladder. No gallbladder wall thickening or pericholecystic fluid. 2. No biliary ductal dilatation. No evidence of filling defect to the ampulla. 3. No acute findings in the abdomen. Electronically Signed   By: Lauralyn PrimesAlex  Bibbey M.D.   On: 01/16/2021 11:28   US Abdomen Limited RUQ (LIVER/GB)  Result Date: 01/15/2021 CLINICAL DATA:  Epigastric pain and elevated LFTs EXAM: ULTRASOUND ABDOMEN LIMITED RIGHT UPPER QUADRANT COMPARISON:  CT from earlier in the same day. FINDINGS: Gallbladder: Gallbladder is well distended. Small gallstones are noted. No wall thickening or pericholecystic fluid is seen. Common bile duct: Diameter: 6.3 mm. This is at the upper limits of normal in size for the patient's age. Liver: No focal lesion identified. Within normal limits in parenchymal echogenicity. Portal vein is patent on color Doppler imaging with normal direction of blood flow towards the liver. Other: None. IMPRESSION: Cholelithiasis  without complicating factors. Electronically Signed   By: Alcide CleverMark  Lukens M.D.   On: 01/15/2021 16:43      DVT prophylaxis: SCDs  Code Status: Full code  Family Communication: No family at bedside   Consultants: Gastroenterology General surgery  Procedures:     Objective    Physical Examination:  General-appears in no acute distress Heart-S1-S2, regular, no murmur auscultated Lungs-clear to auscultation bilaterally, no wheezing or crackles auscultated Abdomen-soft, nontender, no organomegaly Extremities-no edema in the lower extremities Neuro-alert, oriented x3, no focal deficit noted  Status is: Inpatient  Dispo: The patient is from: Home              Anticipated d/c is to: Home              Anticipated d/c date is: 01/18/2021              Patient currently not stable for discharge  Barrier to discharge-ongoing evaluation and management for gallstones  COVID-19 Labs  Recent Labs    01/15/21 1237  FERRITIN 21    Lab Results  Component Value Date   SARSCOV2NAA NEGATIVE 01/15/2021   SARSCOV2NAA NEGATIVE 03/22/2020   SARSCOV2NAA NEGATIVE 11/03/2019    Microbiology  Recent Results (from the past 240 hour(s))  Resp Panel by RT-PCR (Flu A&B, Covid) Nasopharyngeal Swab     Status: None   Collection Time: 01/15/21  3:30 AM   Specimen: Nasopharyngeal Swab; Nasopharyngeal(NP) swabs in  vial transport medium  Result Value Ref Range Status   SARS Coronavirus 2 by RT PCR NEGATIVE NEGATIVE Final    Comment: (NOTE) SARS-CoV-2 target nucleic acids are NOT DETECTED.  The SARS-CoV-2 RNA is generally detectable in upper respiratory specimens during the acute phase of infection. The lowest concentration of SARS-CoV-2 viral copies this assay can detect is 138 copies/mL. A negative result does not preclude SARS-Cov-2 infection and should not be used as the sole basis for treatment or other patient management decisions. A negative result may occur with  improper specimen  collection/handling, submission of specimen other than nasopharyngeal swab, presence of viral mutation(s) within the areas targeted by this assay, and inadequate number of viral copies(<138 copies/mL). A negative result must be combined with clinical observations, patient history, and epidemiological information. The expected result is Negative.  Fact Sheet for Patients:  BloggerCourse.com  Fact Sheet for Healthcare Providers:  SeriousBroker.it  This test is no t yet approved or cleared by the [place] FDA and  has been authorized for detection and/or diagnosis of SARS-CoV-2 by FDA under an Emergency Use Authorization (EUA). This EUA will remain  in effect (meaning this test can be used) for the duration of the COVID-19 declaration under Section 564(b)(1) of the Act, 21 U.S.C.section 360bbb-3(b)(1), unless the authorization is terminated  or revoked sooner.       Influenza A by PCR NEGATIVE NEGATIVE Final   Influenza B by PCR NEGATIVE NEGATIVE Final    Comment: (NOTE) The Xpert Xpress SARS-CoV-2/FLU/RSV plus assay is intended as an aid in the diagnosis of influenza from Nasopharyngeal swab specimens and should not be used as a sole basis for treatment. Nasal washings and aspirates are unacceptable for Xpert Xpress SARS-CoV-2/FLU/RSV testing.  Fact Sheet for Patients: BloggerCourse.com  Fact Sheet for Healthcare Providers: SeriousBroker.it  This test is not yet approved or cleared by the [place] FDA and has been authorized for detection and/or diagnosis of SARS-CoV-2 by FDA under an Emergency Use Authorization (EUA). This EUA will remain in effect (meaning this test can be used) for the duration of the COVID-19 declaration under Section 564(b)(1) of the Act, 21 U.S.C. section 360bbb-3(b)(1), unless the authorization is terminated or revoked.  Performed at [facility], [address] [place], [place] [address]              Meredeth Ide   [facility] If 7PM-7AM, please contact night-coverage at [email or web address], Office  [phone]   01/16/2021, 3:45 PM  LOS: 0 days

## 2021-01-16 NOTE — Anesthesia Preprocedure Evaluation (Deleted)
Anesthesia Evaluation    Airway        Dental   Pulmonary former smoker,           Cardiovascular hypertension,      Neuro/Psych    GI/Hepatic   Endo/Other    Renal/GU      Musculoskeletal   Abdominal   Peds  Hematology   Anesthesia Other Findings   Reproductive/Obstetrics                             Anesthesia Physical Anesthesia Plan  ASA:   Anesthesia Plan:    Post-op Pain Management:    Induction:   PONV Risk Score and Plan:   Airway Management Planned:   Additional Equipment:   Intra-op Plan:   Post-operative Plan:   Informed Consent:   Plan Discussed with:   Anesthesia Plan Comments: (Ms. Rasor believes her last anesthetic triggered a seizure. I spoke with surgery PA and assured him most of our medications decrease the likelihood of a seizure. Although, I cannot guarantee Ms. Botts will not have a seizure during the peri-operative period, if she's compliant with her anti-seizure medications throughout, her likelihood of an anesthesia-related seizure is low. Our anesthesia will reiterate with Ms. Palmer in the morning prior to surgery. )        Anesthesia Quick Evaluation

## 2021-01-16 NOTE — Progress Notes (Signed)
[facility] Progress Note  CC:  Epigastric pain, elevated LFTs  Subjective:  She remains on clear liquids. No N/V. Continues to have epigastric pain, controlled on Morphine IV PRN. No BM since admission.    Objective:  Vital signs in last 24 hours: Temp:  [98 F (36.7 C)-98.9 F (37.2 C)] (P) 98.9 F (37.2 C) (06/24 0500) Pulse Rate:  [66-107] (P) 88 (06/24 0500) Resp:  [14-20] (P) 18 (06/24 0500) BP: (123-152)/(75-105) (P) 131/81 (06/24 0500) SpO2:  [95 %-100 %] (P) 99 % (06/24 0500) Last BM Date: 01/14/21 General:   Alert 30 year old female in NAD.  Heart: RRR, no murmur.  Pulm:  Breath sounds clear throughout.  Abdomen: Soft, nondistended. Mild epigastric tenderness without rebound or guarding. + BS x 4 quads.  Extremities:  Without edema. Neurologic:  Alert and  oriented x4;  grossly normal neurologically. Psych:  Alert and cooperative. Normal mood and affect.  Intake/Output from previous day: 06/23 0701 - 06/24 0700 In: 120 [P.O.:120] Out: 1400 [Urine:1400] Intake/Output this shift: No intake/output data recorded.  Lab Results: Recent Labs    01/14/21 2310 01/15/21 1212 01/16/21 0507  WBC 7.7 6.9 6.6  HGB 13.8 12.5 12.3  HCT 42.4 40.2 39.1  PLT 303 225 228   BMET Recent Labs    01/14/21 2310 01/15/21 1212 01/16/21 0507  NA 139 138 139  K 3.5 3.5 3.4*  CL 105 110 109  CO2 24 19* 22  GLUCOSE 99 82 78  BUN 9 6 <5*  CREATININE 0.56 0.42* 0.48  CALCIUM 9.3 8.2* 8.4*   LFT Recent Labs    01/16/21 0507  PROT 6.6  ALBUMIN 3.5  AST 169*  ALT 330*  ALKPHOS 266*  BILITOT 1.3*   PT/INR Recent Labs    01/15/21 1237  LABPROT 12.9  INR 1.0   Hepatitis Panel Recent Labs    01/15/21 1237  HEPBSAG NON REACTIVE  HCVAB NON REACTIVE  HEPAIGM NON REACTIVE  HEPBIGM NON REACTIVE    CT ABDOMEN PELVIS WO CONTRAST  Result Date: 01/15/2021 CLINICAL DATA:  Epigastric abdominal pain, periumbilical abdominal pain EXAM: CT ABDOMEN AND  PELVIS WITHOUT CONTRAST TECHNIQUE: Multidetector CT imaging of the abdomen and pelvis was performed following the standard protocol without IV contrast. COMPARISON:  Right upper quadrant FINDINGS: Lower chest: The visualized lung bases are clear bilaterally. The visualized heart and pericardium are unremarkable. Small hiatal hernia. Hepatobiliary: No focal liver abnormality is seen. No gallstones, gallbladder wall thickening, or biliary dilatation. Pancreas: Unremarkable Spleen: Unremarkable Adrenals/Urinary Tract: Adrenal glands are unremarkable. Kidneys are normal, without renal calculi, focal lesion, or hydronephrosis. Bladder is unremarkable. Stomach/Bowel: Stomach is within normal limits. Appendix appears normal. No evidence of bowel wall thickening, distention, or inflammatory changes. No free intraperitoneal gas or fluid. Vascular/Lymphatic: No significant vascular findings are present. No enlarged abdominal or pelvic lymph nodes. Reproductive: Uterus and bilateral adnexa are unremarkable. Other: Tiny fat containing umbilical hernia is unchanged. Musculoskeletal: No acute bone abnormality. No lytic or blastic bone lesions identified. IMPRESSION: No acute intra-abdominal pathology identified. No definite radiographic explanation for the patient's reported symptoms. Electronically Signed   By: Fidela Salisbury MD   On: 01/15/2021 03:31   US Abdomen Limited RUQ (LIVER/GB)  Result Date: 01/15/2021 CLINICAL DATA:  Epigastric pain and elevated LFTs EXAM: ULTRASOUND ABDOMEN LIMITED RIGHT UPPER QUADRANT COMPARISON:  CT from earlier in the same day. FINDINGS: Gallbladder: Gallbladder is well distended. Small gallstones are noted. No wall thickening or pericholecystic  fluid is seen. Common bile duct: Diameter: 6.3 mm. This is at the upper limits of normal in size for the patient's age. Liver: No focal lesion identified. Within normal limits in parenchymal echogenicity. Portal vein is patent on color Doppler imaging  with normal direction of blood flow towards the liver. Other: None. IMPRESSION: Cholelithiasis without complicating factors. Electronically Signed   By: Inez Catalina M.D.   On: 01/15/2021 16:43    Assessment / Plan:  33.  30 year old female with lower sternal and epigastric pain and elevated LFTs consistent with acute cholestasis. Normal Lipase level. CTAP without contrast showed a normal liver without evidence of choledocholithiasis or biliary ductal dilatation.  She is afebrile and hemodynamically stable. RUQ sono showed cholelithiasis without evidence of choledocholithiasis, intra or extrahepatic biliary dilatation or cholecystitis. Suspect she had CBD stone which passed. Acute hepatitis panel negative. LFTs drifting downward. Alk phos 275 -> 266. AST 380 -> 169. ALT 454 -> 330. T. Bili 2.5 -> 1.3. No N/V overnight. She continues to have epigastric pain., controlled on Morphine IV PRN. She is afebrile. Hemodynamically stable.  -Abdominal MRI/MRCP to further evaluate the biliary tree, rule out choledocholithiasis -General surgery consult  -Continue IV fluids -Pain management per the hospitalist -Compazine 33m IV Q 6 hrs PRN -Hepatic panel in am   2.  GERD -Pantoprazole 40 mg IV QD -Follow up in GI outpatient clinic    3.  History of epilepsy.  Last seizure activity was 1-1/2 months ago.  On Keppra and Lamictal. Patient reported having seizures during surgery for endometriosis 2 to 3 years ago she stated was triggered by anesthesia.   4. History of alcohol use disorder, abstinent from alcohol x 2 years  5. Hepatic steatosis     Active Problems:   Cholelithiasis with biliary obstruction   Epigastric pain   Elevated LFTs     LOS: 0 days   CNoralyn Pick 01/16/2021, 7:28 AM

## 2021-01-16 NOTE — H&P (View-Only) (Signed)
Jacqueline Jones March 03, 1991  [id number].    Requesting MD: Dr. Adela Lank Chief Complaint/Reason for Consult: Cholelithiasis   HPI: Jacqueline Jones is a 30 year old female with a history of hypertension, epilepsy, asthma and GERD who presented with epigastric pain.  Patient reports that on 6/21 she began having epigastric abdominal pain with radiation to her right flank with associated nausea and vomiting after eating sloppy joe's and mozzarella sticks.  She tried aspirin for this without any improvement.  She notes that the pain is moderate in severity and has been intermittent since onset.  She denies history of similar pain in the past.  No associated fever or current chest pain, shortness of breath or urinary symptoms. She presented to the ED for evaluation where she was noted to have elevated LFTs, normal WBC.  CT showed no acute intra-abdominal pathology.  Right upper quadrant ultrasound showed cholelithiasis without cholecystitis.  CBD 6.3 mm.  GI consulted and planning for MRCP today.  LFTs downtrending today.  Prior abdominal surgery: She reports that she had surgery for endometriosis 2-3 years ago and had seizures after anesthesia  ROS: Review of Systems  Constitutional:  Negative for chills and fever.  Respiratory:  Negative for shortness of breath.   Cardiovascular:  Negative for chest pain and leg swelling.  Gastrointestinal:  Positive for abdominal pain, diarrhea, nausea and vomiting.  Genitourinary:  Positive for hematuria.  Musculoskeletal:  Positive for back pain.  Psychiatric/Behavioral:  Negative for substance abuse.   All other systems reviewed and are negative.  Family History  Problem Relation Age of Onset   Drug abuse Father    Cancer Father        age orange   Lung cancer Father    Depression Mother    Anxiety disorder Mother    Drug abuse Brother        incarcirated    Colon cancer Neg Hx     Past Medical History:  Diagnosis Date   Anemia     Anxiety    Anxiety    Asthma    Depression    Depression    Elevated cholesterol    Frequent headaches    High blood pressure    IBS (irritable bowel syndrome)    Infection    UTI   Obesity    Pneumonia    Seizures (HCC)    unknown cause- started 08/14  "monthly"   Sepsis (HCC)    UTI (lower urinary tract infection)     Past Surgical History:  Procedure Laterality Date   COLONOSCOPY     age [age]   DIAGNOSTIC LAPAROSCOPY      Social History:  reports that she has quit smoking. Her smoking use included cigarettes. She has a 2.50 pack-year smoking history. She has quit using smokeless tobacco. She reports previous drug use. Frequency: 7.00 times per week. She reports that she does not drink alcohol. Alcohol use: Reports alcohol use since age of 51.  Recently quit in the last few months Tobacco use: Smokes Black and mild daily Illicit drug use: None Employment: None, on disability Married  Allergies:  Allergies  Allergen Reactions   Toradol [Ketorolac Tromethamine] Itching    seizure   Clonazepam Other (See Comments)    Seizure   Flexeril [Cyclobenzaprine] Other (See Comments)    seizure   Gabapentin Other (See Comments)    seizures    Tramadol Itching    Seizures   Vicodin [Hydrocodone-Acetaminophen] Itching  Pt states she can take Tylenol without difficulty.   Adhesive [Tape] Rash   Azithromycin Rash   Benzonatate Rash   Keflet [Cephalexin] Rash    Did it involve swelling of the face/tongue/throat, SOB, or low BP? No Did it involve sudden or severe rash/hives, skin peeling, or any reaction on the inside of your mouth or nose? No Did you need to seek medical attention at a hospital or doctor's office? No When did it last happen?       If all above answers are "NO", may proceed with cephalosporin use.    Latex Rash   Levaquin [Levofloxacin In D5w] Rash    Medications Prior to Admission  Medication Sig Dispense Refill   baclofen (LIORESAL) 10  MG tablet Take 1 tablet (10 mg total) by mouth 3 (three) times daily. (Patient taking differently: Take 10 mg by mouth daily.) 30 each 0   folic acid (FOLVITE) 1 MG tablet Take 1 mg by mouth daily.     lamoTRIgine (LAMICTAL) 200 MG tablet Take 200 mg by mouth 2 (two) times daily.     levETIRAcetam (KEPPRA) 1000 MG tablet Take 1,000-1,500 mg by mouth See admin instructions. Take 1,000mg  at AM and 1,500mg  at PM     ondansetron (ZOFRAN) 4 MG tablet Take 1 tablet (4 mg total) by mouth every 8 (eight) hours as needed for nausea or vomiting. 20 tablet 0   pantoprazole (PROTONIX) 40 MG tablet Take 1 tablet (40 mg total) by mouth daily. 30 tablet 3   guaiFENesin (ROBITUSSIN) 100 MG/5ML liquid Take 5-10 mLs (100-200 mg total) by mouth every 4 (four) hours as needed for cough. FOR CHEST CONGESTION AND PHLEGM (Patient not taking: No sig reported) 60 mL 0   levETIRAcetam (KEPPRA) 750 MG tablet Take 1 tablet (750 mg total) by mouth 2 (two) times daily. (Patient not taking: Reported on 01/15/2021) 60 tablet 0   melatonin 3 MG TABS tablet Take 1 tablet (3 mg total) by mouth at bedtime. (Patient not taking: Reported on 01/15/2021) 30 tablet 0     Physical Exam: Blood pressure (P) 131/81, pulse (P) 88, temperature (P) 98.9 F (37.2 C), temperature source (P) Oral, resp. rate (P) 18, SpO2 (P) 99 %. General: pleasant, WD/WN white female who is laying in bed in NAD HEENT: head is normocephalic, atraumatic.  Sclera are noninjected.  PERRL.  Ears and nose without any masses or lesions.  Mouth is pink and moist. Dentition fair Heart: regular, rate, and rhythm.  Normal s1,s2. No obvious murmurs, gallops, or rubs noted.  Palpable pedal pulses bilaterally  Lungs: CTAB, no wheezes, rhonchi, or rales noted.  Respiratory effort nonlabored Abd: Soft, ND, tenderness of the epigastrium without peritonitis, +BS, no masses, hernias, or organomegaly MS: no BUE/BLE edema, calves soft and nontender Skin: warm and dry with no masses,  lesions, or rashes Psych: A&Ox4 with an appropriate affect Neuro: cranial nerves grossly intact, equal strength in BUE/BLE bilaterally, normal speech, thought process intact, moves all extremities, gait not assessed.   Results for orders placed or performed during the hospital encounter of 01/15/21 (from the past 48 hour(s))  Urinalysis, Routine w reflex microscopic     Status: Abnormal   Collection Time: 01/14/21 11:09 PM  Result Value Ref Range   Color, Urine YELLOW YELLOW   APPearance HAZY (A) CLEAR   Specific Gravity, Urine 1.030 1.005 - 1.030   pH 6.0 5.0 - 8.0   Glucose, UA NEGATIVE NEGATIVE mg/dL   Hgb urine dipstick LARGE (  A) NEGATIVE   Bilirubin Urine LARGE (A) NEGATIVE   Ketones, ur NEGATIVE NEGATIVE mg/dL   Protein, ur 30 (A) NEGATIVE mg/dL   Nitrite NEGATIVE NEGATIVE   Leukocytes,Ua SMALL (A) NEGATIVE   RBC / HPF >50 (H) 0 - 5 RBC/hpf   WBC, UA 21-50 0 - 5 WBC/hpf   Squamous Epithelial / LPF 0-5 0 - 5   Mucus PRESENT     Comment: Performed at [facility], [address], [place], [place] [address]  Pregnancy, urine     Status: None   Collection Time: 01/14/21 11:09 PM  Result Value Ref Range   Preg Test, Ur NEGATIVE NEGATIVE    Comment:        THE SENSITIVITY OF THIS METHODOLOGY IS >20 mIU/mL. Performed at [facility], [address] [place], [place] [address]   Lipase, blood     Status: None   Collection Time: 01/14/21 11:10 PM  Result Value Ref Range   Lipase 36 11 - 51 U/L    Comment: Performed at [facility], [address] [place], [place] [address]  Comprehensive metabolic panel     Status: Abnormal   Collection Time: 01/14/21 11:10 PM  Result Value Ref Range   Sodium 139 135 - 145 mmol/L   Potassium 3.5 3.5 - 5.1 mmol/L   Chloride 105 98 - 111 mmol/L   CO2 24 22 - 32 mmol/L   Glucose, Bld 99 70 - 99 mg/dL    Comment: Glucose reference range applies only to samples taken after fasting for  at least 8 hours.   BUN 9 6 - 20 mg/dL   Creatinine, Ser 9.14 0.44 - 1.00 mg/dL   Calcium 9.3 8.9 - 78.2 mg/dL   Total Protein 7.1 6.5 - 8.1 g/dL   Albumin 4.3 3.5 - 5.0 g/dL   AST 956 (H) 15 - 41 U/L   ALT 564 (H) 0 - 44 U/L    Comment: RESULTS CONFIRMED BY MANUAL DILUTION   Alkaline Phosphatase 281 (H) 38 - 126 U/L   Total Bilirubin 3.4 (H) 0.3 - 1.2 mg/dL   GFR, Estimated >21 >30 mL/min    Comment: (NOTE) Calculated using the CKD-EPI Creatinine Equation (2021)    Anion gap 10 5 - 15    Comment: Performed at [facility], [address] [place], [place] [address]  CBC     Status: None   Collection Time: 01/14/21 11:10 PM  Result Value Ref Range   WBC 7.7 4.0 - 10.5 K/uL   RBC 5.04 3.87 - 5.11 MIL/uL   Hemoglobin 13.8 12.0 - 15.0 g/dL   HCT 46.9 62.9 - 52.8 %   MCV 84.1 80.0 - 100.0 fL   MCH 27.4 26.0 - 34.0 pg   MCHC 32.5 30.0 - 36.0 g/dL   RDW 41.3 24.4 - 01.0 %   Platelets 303 150 - 400 K/uL   nRBC 0.0 0.0 - 0.2 %    Comment: Performed at [facility], [address] [place], [place] [address]  Resp Panel by RT-PCR (Flu A&B, Covid) Nasopharyngeal Swab     Status: None   Collection Time: 01/15/21  3:30 AM   Specimen: Nasopharyngeal Swab; Nasopharyngeal(NP) swabs in vial transport medium  Result Value Ref Range   SARS Coronavirus 2 by RT PCR NEGATIVE NEGATIVE    Comment: (NOTE) SARS-CoV-2 target nucleic acids are NOT DETECTED.  The SARS-CoV-2 RNA is generally detectable in upper respiratory specimens during the acute phase  of infection. The lowest concentration of SARS-CoV-2 viral copies this assay can detect is 138 copies/mL. A negative result does not preclude SARS-Cov-2 infection and should not be used as the sole basis for treatment or other patient management decisions. A negative result may occur with  improper specimen collection/handling, submission of specimen other than nasopharyngeal swab, presence of viral  mutation(s) within the areas targeted by this assay, and inadequate number of viral copies(<138 copies/mL). A negative result must be combined with clinical observations, patient history, and epidemiological information. The expected result is Negative.  Fact Sheet for Patients:  BloggerCourse.com  Fact Sheet for Healthcare Providers:  SeriousBroker.it  This test is no t yet approved or cleared by the [place] FDA and  has been authorized for detection and/or diagnosis of SARS-CoV-2 by FDA under an Emergency Use Authorization (EUA). This EUA will remain  in effect (meaning this test can be used) for the duration of the COVID-19 declaration under Section 564(b)(1) of the Act, 21 U.S.C.section 360bbb-3(b)(1), unless the authorization is terminated  or revoked sooner.       Influenza A by PCR NEGATIVE NEGATIVE   Influenza B by PCR NEGATIVE NEGATIVE    Comment: (NOTE) The Xpert Xpress SARS-CoV-2/FLU/RSV plus assay is intended as an aid in the diagnosis of influenza from Nasopharyngeal swab specimens and should not be used as a sole basis for treatment. Nasal washings and aspirates are unacceptable for Xpert Xpress SARS-CoV-2/FLU/RSV testing.  Fact Sheet for Patients: BloggerCourse.com  Fact Sheet for Healthcare Providers: SeriousBroker.it  This test is not yet approved or cleared by the [place] FDA and has been authorized for detection and/or diagnosis of SARS-CoV-2 by FDA under an Emergency Use Authorization (EUA). This EUA will remain in effect (meaning this test can be used) for the duration of the COVID-19 declaration under Section 564(b)(1) of the Act, 21 U.S.C. section 360bbb-3(b)(1), unless the authorization is terminated or revoked.  Performed at [facility], [address] [place], [place] [address]   Comprehensive metabolic panel      Status: Abnormal   Collection Time: 01/15/21 12:12 PM  Result Value Ref Range   Sodium 138 135 - 145 mmol/L   Potassium 3.5 3.5 - 5.1 mmol/L   Chloride 110 98 - 111 mmol/L   CO2 19 (L) 22 - 32 mmol/L   Glucose, Bld 82 70 - 99 mg/dL    Comment: Glucose reference range applies only to samples taken after fasting for at least 8 hours.   BUN 6 6 - 20 mg/dL   Creatinine, Ser 6.04 (L) 0.44 - 1.00 mg/dL   Calcium 8.2 (L) 8.9 - 10.3 mg/dL   Total Protein 6.7 6.5 - 8.1 g/dL   Albumin 3.6 3.5 - 5.0 g/dL   AST 540 (H) 15 - 41 U/L   ALT 454 (H) 0 - 44 U/L   Alkaline Phosphatase 276 (H) 38 - 126 U/L   Total Bilirubin 2.5 (H) 0.3 - 1.2 mg/dL   GFR, Estimated >98 >11 mL/min    Comment: (NOTE) Calculated using the CKD-EPI Creatinine Equation (2021)    Anion gap 9 5 - 15    Comment: Performed at [facility], [address] [place], [place] [address]  CBC with Differential/Platelet     Status: None   Collection Time: 01/15/21 12:12 PM  Result Value Ref Range   WBC 6.9 4.0 - 10.5 K/uL   RBC 4.57 3.87 - 5.11 MIL/uL   Hemoglobin 12.5 12.0 - 15.0  g/dL   HCT 82.9 56.2 - 13.0 %   MCV 88.0 80.0 - 100.0 fL   MCH 27.4 26.0 - 34.0 pg   MCHC 31.1 30.0 - 36.0 g/dL   RDW 86.5 78.4 - 69.6 %   Platelets 225 150 - 400 K/uL   nRBC 0.0 0.0 - 0.2 %   Neutrophils Relative % 65 %   Neutro Abs 4.5 1.7 - 7.7 K/uL   Lymphocytes Relative 24 %   Lymphs Abs 1.6 0.7 - 4.0 K/uL   Monocytes Relative 6 %   Monocytes Absolute 0.4 0.1 - 1.0 K/uL   Eosinophils Relative 4 %   Eosinophils Absolute 0.3 0.0 - 0.5 K/uL   Basophils Relative 1 %   Basophils Absolute 0.1 0.0 - 0.1 K/uL   Immature Granulocytes 0 %   Abs Immature Granulocytes 0.02 0.00 - 0.07 K/uL    Comment: Performed at [facility], [address] [place], [place] [address]  Hepatitis panel, acute     Status: None   Collection Time: 01/15/21 12:37 PM  Result Value Ref Range   Hepatitis B Surface Ag NON REACTIVE NON  REACTIVE   HCV Ab NON REACTIVE NON REACTIVE    Comment: (NOTE) Nonreactive HCV antibody screen is consistent with no HCV infections,  unless recent infection is suspected or other evidence exists to indicate HCV infection.     Hep A IgM NON REACTIVE NON REACTIVE   Hep B C IgM NON REACTIVE NON REACTIVE    Comment: Performed at [facility], [address] [place], [place] [address]  Protime-INR     Status: None   Collection Time: 01/15/21 12:37 PM  Result Value Ref Range   Prothrombin Time 12.9 11.4 - 15.2 seconds   INR 1.0 0.8 - 1.2    Comment: (NOTE) INR goal varies based on device and disease states. Performed at [facility], [address] [place], [place] [address]   IgG     Status: None   Collection Time: 01/15/21 12:37 PM  Result Value Ref Range   IgG (Immunoglobin G), Serum 997 586 - 1,602 mg/dL    Comment: (NOTE) Performed At: [facility] [address] [place], [place] [id number] Jolene Schimke MD [id number]   Ceruloplasmin     Status: None   Collection Time: 01/15/21 12:37 PM  Result Value Ref Range   Ceruloplasmin 31.3 19.0 - 39.0 mg/dL    Comment: (NOTE) Performed At: [facility] [address] [place], [place] [id number] Jolene Schimke MD OA:[id number]   Iron     Status: Abnormal   Collection Time: 01/15/21 12:37 PM  Result Value Ref Range   Iron 180 (H) 28 - 170 ug/dL    Comment: Performed at [facility], [address], [place], [place] [address]  Ferritin     Status: None   Collection Time: 01/15/21 12:37 PM  Result Value Ref Range   Ferritin 21 11 - 307 ng/mL    Comment: Performed at [facility], [address] [place], [place] [address]  Comprehensive metabolic panel     Status: Abnormal   Collection Time: 01/16/21  5:07 AM  Result Value Ref Range   Sodium 139 135 - 145 mmol/L   Potassium 3.4 (L) 3.5 - 5.1 mmol/L   Chloride 109 98 - 111 mmol/L   CO2 22 22  - 32 mmol/L   Glucose, Bld 78 70 - 99 mg/dL    Comment:  Glucose reference range applies only to samples taken after fasting for at least 8 hours.   BUN <5 (L) 6 - 20 mg/dL   Creatinine, Ser 6.22 0.44 - 1.00 mg/dL   Calcium 8.4 (L) 8.9 - 10.3 mg/dL   Total Protein 6.6 6.5 - 8.1 g/dL   Albumin 3.5 3.5 - 5.0 g/dL   AST 297 (H) 15 - 41 U/L   ALT 330 (H) 0 - 44 U/L   Alkaline Phosphatase 266 (H) 38 - 126 U/L   Total Bilirubin 1.3 (H) 0.3 - 1.2 mg/dL   GFR, Estimated >98 >92 mL/min    Comment: (NOTE) Calculated using the CKD-EPI Creatinine Equation (2021)    Anion gap 8 5 - 15    Comment: Performed at [facility], [address], [place], [place] [address]  CBC     Status: None   Collection Time: 01/16/21  5:07 AM  Result Value Ref Range   WBC 6.6 4.0 - 10.5 K/uL   RBC 4.45 3.87 - 5.11 MIL/uL   Hemoglobin 12.3 12.0 - 15.0 g/dL   HCT 74.0 81.4 - 48.1 %   MCV 87.9 80.0 - 100.0 fL   MCH 27.6 26.0 - 34.0 pg   MCHC 31.5 30.0 - 36.0 g/dL   RDW 85.6 31.4 - 97.0 %   Platelets 228 150 - 400 K/uL   nRBC 0.0 0.0 - 0.2 %    Comment: Performed at [facility], [address] [place], [place] [address]  Bilirubin, direct     Status: Abnormal   Collection Time: 01/16/21  5:07 AM  Result Value Ref Range   Bilirubin, Direct 0.5 (H) 0.0 - 0.2 mg/dL    Comment: Performed at [facility], [address], [place], [place] [address]   CT ABDOMEN PELVIS WO CONTRAST  Result Date: 01/15/2021 CLINICAL DATA:  Epigastric abdominal pain, periumbilical abdominal pain EXAM: CT ABDOMEN AND PELVIS WITHOUT CONTRAST TECHNIQUE: Multidetector CT imaging of the abdomen and pelvis was performed following the standard protocol without IV contrast. COMPARISON:  Right upper quadrant FINDINGS: Lower chest: The visualized lung bases are clear bilaterally. The visualized heart and pericardium are unremarkable. Small hiatal hernia. Hepatobiliary: No focal liver  abnormality is seen. No gallstones, gallbladder wall thickening, or biliary dilatation. Pancreas: Unremarkable Spleen: Unremarkable Adrenals/Urinary Tract: Adrenal glands are unremarkable. Kidneys are normal, without renal calculi, focal lesion, or hydronephrosis. Bladder is unremarkable. Stomach/Bowel: Stomach is within normal limits. Appendix appears normal. No evidence of bowel wall thickening, distention, or inflammatory changes. No free intraperitoneal gas or fluid. Vascular/Lymphatic: No significant vascular findings are present. No enlarged abdominal or pelvic lymph nodes. Reproductive: Uterus and bilateral adnexa are unremarkable. Other: Tiny fat containing umbilical hernia is unchanged. Musculoskeletal: No acute bone abnormality. No lytic or blastic bone lesions identified. IMPRESSION: No acute intra-abdominal pathology identified. No definite radiographic explanation for the patient's reported symptoms. Electronically Signed   By: Helyn Numbers MD   On: 01/15/2021 03:31   US Abdomen Limited RUQ (LIVER/GB)  Result Date: 01/15/2021 CLINICAL DATA:  Epigastric pain and elevated LFTs EXAM: ULTRASOUND ABDOMEN LIMITED RIGHT UPPER QUADRANT COMPARISON:  CT from earlier in the same day. FINDINGS: Gallbladder: Gallbladder is well distended. Small gallstones are noted. No wall thickening or pericholecystic fluid is seen. Common bile duct: Diameter: 6.3 mm. This is at the upper limits of normal in size for the patient's age. Liver: No focal lesion identified. Within normal limits in parenchymal echogenicity. Portal vein is  patent on color Doppler imaging with normal direction of blood flow towards the liver. Other: None. IMPRESSION: Cholelithiasis without complicating factors. Electronically Signed   By: Alcide CleverMark  Lukens M.D.   On: 01/15/2021 16:43    Anti-infectives (From admission, onward)    None        Assessment/Plan Symptomatic Cholelithiasis  Elevated LFT's with possible Choledocholithiasis  - Plan  for Lap Chole during admission. Timing pending MRCP results to determine if patient will need ERCP pre-op - Patinet has hx of seizures with anesthesia. When able to determine timing of Lap Chole, will discuss with anesthesia to see what changes can be made to decrease this risk. She has been compliant with her seizure medications. - WBC wnl and no evidence of cholecystitis on imaging. No abx indicated at this time - Continue to trend labs - We will follow with you  FEN - Currently on CLD VTE - SCDs, per primary ID - None  GERD Epilepsy   Jacinto HalimMichael M Amily Depp, [facility] 01/16/2021, 10:13 AM Please see Amion for pager number during day hours 7:00am-4:30pm

## 2021-01-17 ENCOUNTER — Telehealth: Payer: Medicaid Other | Admitting: Emergency Medicine

## 2021-01-17 DIAGNOSIS — R109 Unspecified abdominal pain: Secondary | ICD-10-CM

## 2021-01-17 DIAGNOSIS — R7989 Other specified abnormal findings of blood chemistry: Secondary | ICD-10-CM

## 2021-01-17 DIAGNOSIS — M549 Dorsalgia, unspecified: Secondary | ICD-10-CM

## 2021-01-17 DIAGNOSIS — Z8719 Personal history of other diseases of the digestive system: Secondary | ICD-10-CM

## 2021-01-17 LAB — COMPREHENSIVE METABOLIC PANEL
ALT: 250 U/L — ABNORMAL HIGH (ref 0–44)
AST: 101 U/L — ABNORMAL HIGH (ref 15–41)
Albumin: 3.6 g/dL (ref 3.5–5.0)
Alkaline Phosphatase: 301 U/L — ABNORMAL HIGH (ref 38–126)
Anion gap: 6 (ref 5–15)
BUN: <5 mg/dL — ABNORMAL LOW (ref 6–20)
CO2: 24 mmol/L (ref 22–32)
Calcium: 8.6 mg/dL — ABNORMAL LOW (ref 8.9–10.3)
Chloride: 108 mmol/L (ref 98–111)
Creatinine, Ser: 0.61 mg/dL (ref 0.44–1.00)
GFR, Estimated: >60 mL/min (ref >60)
Glucose, Bld: 86 mg/dL (ref 70–99)
Potassium: 3.3 mmol/L — ABNORMAL LOW (ref 3.5–5.1)
Sodium: 138 mmol/L (ref 135–145)
Total Bilirubin: 1 mg/dL (ref 0.3–1.2)
Total Protein: 7.2 g/dL (ref 6.5–8.1)

## 2021-01-17 LAB — CBC
HCT: 40 % (ref 36.0–46.0)
Hemoglobin: 12.6 g/dL (ref 12.0–15.0)
MCH: 27.7 pg (ref 26.0–34.0)
MCHC: 31.5 g/dL (ref 30.0–36.0)
MCV: 87.9 fL (ref 80.0–100.0)
Platelets: 259 10*3/uL (ref 150–400)
RBC: 4.55 MIL/uL (ref 3.87–5.11)
RDW: 14.6 % (ref 11.5–15.5)
WBC: 6.1 10*3/uL (ref 4.0–10.5)
nRBC: 0 % (ref 0.0–0.2)

## 2021-01-17 SURGERY — LAPAROSCOPIC CHOLECYSTECTOMY
Anesthesia: General

## 2021-01-17 MED ORDER — SODIUM CHLORIDE 0.9 % IV SOLN
INTRAVENOUS | Status: DC | PRN
  Administered 2021-01-17: 1000 mL via INTRAVENOUS

## 2021-01-17 MED ORDER — LEVETIRACETAM 500 MG PO TABS: 1000.0000 mg | ORAL_TABLET | Freq: Every day | ORAL | Status: DC

## 2021-01-17 MED ORDER — PANTOPRAZOLE SODIUM 40 MG PO TBEC: 40.0000 mg | DELAYED_RELEASE_TABLET | Freq: Every day | ORAL | Status: DC

## 2021-01-17 MED ORDER — LEVETIRACETAM 500 MG PO TABS: 1500.0000 mg | ORAL_TABLET | Freq: Every morning | ORAL | Status: DC

## 2021-01-17 MED ORDER — POTASSIUM CHLORIDE CRYS ER 20 MEQ PO TBCR
40.0000 meq | EXTENDED_RELEASE_TABLET | Freq: Once | ORAL | Status: AC
  Administered 2021-01-17: 40 meq via ORAL
  Filled 2021-01-17: qty 2

## 2021-01-17 NOTE — Progress Notes (Signed)
Subjective/Chief Complaint: Patient is asymptomatic - pain free Hungry   Objective: Vital signs in last 24 hours: Temp:  [97.9 F (36.6 C)-98.2 F (36.8 C)] 98 F (36.7 C) (06/25 0533) Pulse Rate:  [85-91] 85 (06/25 0533) Resp:  [16-18] 18 (06/25 0533) BP: (122-134)/(75-86) 134/86 (06/25 0533) SpO2:  [96 %-100 %] 100 % (06/25 0533) Weight:  [92.5 kg] 92.5 kg (06/24 1000) Last BM Date: 01/16/21  Intake/Output from previous day: 06/24 0701 - 06/25 0700 In: 4311.8 [P.O.:358; I.V.:3653.9; IV Piggyback:299.9] Out: 1700 [Urine:1700] Intake/Output this shift: No intake/output data recorded.  General appearance: alert, cooperative, and no distress GI: soft, non-tender; bowel sounds normal; no masses,  no organomegaly  Lab Results:  Recent Labs    01/16/21 0507 01/17/21 0614  WBC 6.6 6.1  HGB 12.3 12.6  HCT 39.1 40.0  PLT 228 259   BMET Recent Labs    01/16/21 0507 01/17/21 0614  NA 139 138  K 3.4* 3.3*  CL 109 108  CO2 22 24  GLUCOSE 78 86  BUN <5* <5*  CREATININE 0.48 0.61  CALCIUM 8.4* 8.6*   PT/INR Recent Labs    01/15/21 1237  LABPROT 12.9  INR 1.0   ABG No results for input(s): PHART, HCO3 in the last 72 hours.  Invalid input(s): PCO2, PO2  Studies/Results: MR 3D Recon At Scanner  Result Date: 01/16/2021 CLINICAL DATA:  Abdominal pain, gallstones, elevated LFTs, biliary obstruction suspected EXAM: MRI ABDOMEN WITHOUT AND WITH CONTRAST (INCLUDING MRCP) TECHNIQUE: Multiplanar multisequence MR imaging of the abdomen was performed both before and after the administration of intravenous contrast. Heavily T2-weighted images of the biliary and pancreatic ducts were obtained, and three-dimensional MRCP images were rendered by post processing. CONTRAST:  4mL GADAVIST GADOBUTROL 1 MMOL/ML IV SOLN COMPARISON:  CT abdomen pelvis, 01/15/2021 FINDINGS: Lower chest: No acute findings. Hepatobiliary: No mass or other parenchymal abnormality identified. Tiny  gallstones in the dependent gallbladder. No gallbladder wall thickening or pericholecystic fluid. No biliary ductal dilatation. No evidence of filling defect to the ampulla. Pancreas: No mass, inflammatory changes, or other parenchymal abnormality identified. Spleen:  Within normal limits in size and appearance. Adrenals/Urinary Tract: No masses identified. No evidence of hydronephrosis. Stomach/Bowel: Visualized portions within the abdomen are unremarkable. Vascular/Lymphatic: No pathologically enlarged lymph nodes identified. No abdominal aortic aneurysm demonstrated. Other:  None. Musculoskeletal: No suspicious bone lesions identified. IMPRESSION: 1. Tiny gallstones in the dependent gallbladder. No gallbladder wall thickening or pericholecystic fluid. 2. No biliary ductal dilatation. No evidence of filling defect to the ampulla. 3. No acute findings in the abdomen. Electronically Signed   By: Lauralyn Primes M.D.   On: 01/16/2021 11:28   MR ABDOMEN MRCP W WO CONTAST  Result Date: 01/16/2021 CLINICAL DATA:  Abdominal pain, gallstones, elevated LFTs, biliary obstruction suspected EXAM: MRI ABDOMEN WITHOUT AND WITH CONTRAST (INCLUDING MRCP) TECHNIQUE: Multiplanar multisequence MR imaging of the abdomen was performed both before and after the administration of intravenous contrast. Heavily T2-weighted images of the biliary and pancreatic ducts were obtained, and three-dimensional MRCP images were rendered by post processing. CONTRAST:  47mL GADAVIST GADOBUTROL 1 MMOL/ML IV SOLN COMPARISON:  CT abdomen pelvis, 01/15/2021 FINDINGS: Lower chest: No acute findings. Hepatobiliary: No mass or other parenchymal abnormality identified. Tiny gallstones in the dependent gallbladder. No gallbladder wall thickening or pericholecystic fluid. No biliary ductal dilatation. No evidence of filling defect to the ampulla. Pancreas: No mass, inflammatory changes, or other parenchymal abnormality identified. Spleen:  Within normal limits  in size  and appearance. Adrenals/Urinary Tract: No masses identified. No evidence of hydronephrosis. Stomach/Bowel: Visualized portions within the abdomen are unremarkable. Vascular/Lymphatic: No pathologically enlarged lymph nodes identified. No abdominal aortic aneurysm demonstrated. Other:  None. Musculoskeletal: No suspicious bone lesions identified. IMPRESSION: 1. Tiny gallstones in the dependent gallbladder. No gallbladder wall thickening or pericholecystic fluid. 2. No biliary ductal dilatation. No evidence of filling defect to the ampulla. 3. No acute findings in the abdomen. Electronically Signed   By: Lauralyn Primes M.D.   On: 01/16/2021 11:28   US Abdomen Limited RUQ (LIVER/GB)  Result Date: 01/15/2021 CLINICAL DATA:  Epigastric pain and elevated LFTs EXAM: ULTRASOUND ABDOMEN LIMITED RIGHT UPPER QUADRANT COMPARISON:  CT from earlier in the same day. FINDINGS: Gallbladder: Gallbladder is well distended. Small gallstones are noted. No wall thickening or pericholecystic fluid is seen. Common bile duct: Diameter: 6.3 mm. This is at the upper limits of normal in size for the patient's age. Liver: No focal lesion identified. Within normal limits in parenchymal echogenicity. Portal vein is patent on color Doppler imaging with normal direction of blood flow towards the liver. Other: None. IMPRESSION: Cholelithiasis without complicating factors. Electronically Signed   By: Alcide Clever M.D.   On: 01/15/2021 16:43    Anti-infectives: Anti-infectives (From admission, onward)    Start     Dose/Rate Route Frequency Ordered Stop   01/17/21 0600  cefTRIAXone (ROCEPHIN) 2 g in sodium chloride 0.9 % 100 mL IVPB       Note to Pharmacy: Spoke with pharmacy about abx choice given patients allergy   2 g 200 mL/hr over 30 Minutes Intravenous On call to O.R. 01/16/21 1606 01/17/21 0618       Assessment/Plan: Symptomatic cholelithiasis - probably had choledocholithiasis that has now passed.  Normal T. Bili.     Significant backlog in OR schedule today due to other emergencies Since she is asymptomatic and labs improving, will try to feed her breakfast.  If she does well, she can be discharged.  My office will contact her on Monday to get her scheduled for elective lap cholecystectomy later in the week or early the next week.    LOS: 1 day    Wynona Luna 01/17/2021

## 2021-01-17 NOTE — Plan of Care (Addendum)
Patient discharged from facility. Plan is for [facility] next week. Patient denies questions or concerns at this time.    Problem: Education: Goal: Knowledge of General Education information will improve Description: Including pain rating scale, medication(s)/side effects and non-pharmacologic comfort measures Outcome: Adequate for Discharge   Problem: Health Behavior/Discharge Planning: Goal: Ability to manage health-related needs will improve Outcome: Adequate for Discharge   Problem: Clinical Measurements: Goal: Will remain free from infection Outcome: Adequate for Discharge

## 2021-01-17 NOTE — Discharge Summary (Signed)
Physician Discharge Summary  Jacqueline Jones ZDG:[id number] DOB: [birth year] DOA: 01/15/2021  PCP: Norm Salt, PA  Admit date: 01/15/2021 Discharge date: 01/17/2021  Time spent: 50 minutes  Recommendations for Outpatient Follow-up:  Follow-up general surgery as outpatient  Discharge Diagnoses:  Active Problems:   Gallstones   Epigastric pain   Elevated LFTs   Cholelithiasis   Discharge Condition: Stable  Diet recommendation: Low-fat diet  Filed Weights   01/16/21 1000  Weight: 92.5 kg    History of present illness:  30 year old female with medical history of GERD, epilepsy presented with abdominal pain.  It was associated with nausea and vomiting. In the ED she was found to have transaminitis, CT abdomen/pelvis was unremarkable. Gastroenterology was consulted    Hospital Course:   Gallstones -MRCP abdomen obtained today shows tiny gallstones in the dependent gallbladder -No biliary ductal dilatation, no evidence of filling defect to the ampulla -Patient is asymptomatic -General surgery is planning to do cholecystectomy as outpatient -We will discharge home   Transaminitis -LFTs on presentation was significantly elevated, AST/ALT 643/564; today improved to 101/250 respectively -MRCP shows no evidence of filling defect in the common bile duct -Likely she has passed a stone -Gastroenterology has signed off   Hypokalemia -Potassium is 3.3 -We will give K. Dur 40 mEq p.o. x1 before discharge     Epilepsy -Continue Lamictal 200 mg p.o. twice daily - continue  keppra 1500 mg q am; 1000 mg q pm   Procedures: MRCP abdomen  Consultations: Gastroenterology General surgery  Discharge Exam: Vitals:   01/16/21 2156 01/17/21 0533  BP: 122/75 134/86  Pulse: 91 85  Resp: 18 18  Temp: 98.2 F (36.8 C) 98 F (36.7 C)  SpO2: 96% 100%    General: Appears in no acute distress Cardiovascular: S1-S2, regular Respiratory: Clear to auscultation  bilaterally  Discharge Instructions   Discharge Instructions     Diet - low sodium heart healthy   Complete by: As directed    Increase activity slowly   Complete by: As directed       Allergies as of 01/17/2021       Reactions   Toradol [ketorolac Tromethamine] Itching   seizure   Clonazepam Other (See Comments)   Seizure   Flexeril [cyclobenzaprine] Other (See Comments)   seizure   Gabapentin Other (See Comments)   seizures   Tramadol Itching   Seizures   Vicodin [hydrocodone-acetaminophen] Itching   Pt states she can take Tylenol without difficulty.   Adhesive [tape] Rash   Azithromycin Rash   Benzonatate Rash   Keflet [cephalexin] Rash   Did it involve swelling of the face/tongue/throat, SOB, or low BP? No Did it involve sudden or severe rash/hives, skin peeling, or any reaction on the inside of your mouth or nose? No Did you need to seek medical attention at a hospital or doctor's office? No When did it last happen?       If all above answers are "NO", may proceed with cephalosporin use.   Latex Rash   Levaquin [levofloxacin In D5w] Rash        Medication List     STOP taking these medications    guaiFENesin 100 MG/5ML liquid Commonly known as: ROBITUSSIN       TAKE these medications    baclofen 10 MG tablet Commonly known as: LIORESAL Take 1 tablet (10 mg total) by mouth 3 (three) times daily. What changed: when to take this   folic acid 1 MG tablet  Commonly known as: FOLVITE Take 1 mg by mouth daily.   lamoTRIgine 200 MG tablet Commonly known as: LAMICTAL Take 200 mg by mouth 2 (two) times daily.   levETIRAcetam 1000 MG tablet Commonly known as: KEPPRA Take 1,000-1,500 mg by mouth See admin instructions. Take 1,000mg  at AM and 1,500mg  at PM What changed: Another medication with the same name was removed. Continue taking this medication, and follow the directions you see here.   melatonin 3 MG Tabs tablet Take 1 tablet (3 mg total) by  mouth at bedtime.   ondansetron 4 MG tablet Commonly known as: Zofran Take 1 tablet (4 mg total) by mouth every 8 (eight) hours as needed for nausea or vomiting.   pantoprazole 40 MG tablet Commonly known as: PROTONIX Take 1 tablet (40 mg total) by mouth daily.       Allergies  Allergen Reactions   Toradol [Ketorolac Tromethamine] Itching    seizure   Clonazepam Other (See Comments)    Seizure   Flexeril [Cyclobenzaprine] Other (See Comments)    seizure   Gabapentin Other (See Comments)    seizures    Tramadol Itching    Seizures   Vicodin [Hydrocodone-Acetaminophen] Itching    Pt states she can take Tylenol without difficulty.   Adhesive [Tape] Rash   Azithromycin Rash   Benzonatate Rash   Keflet [Cephalexin] Rash    Did it involve swelling of the face/tongue/throat, SOB, or low BP? No Did it involve sudden or severe rash/hives, skin peeling, or any reaction on the inside of your mouth or nose? No Did you need to seek medical attention at a hospital or doctor's office? No When did it last happen?       If all above answers are "NO", may proceed with cephalosporin use.    Latex Rash   Levaquin [Levofloxacin In D5w] Rash    Follow-up Information     Manus Rudd, MD Follow up.   Specialty: General Surgery Why: My office will contact you on Monday to get you scheduled to have your gallbladder removed. Contact information: [address] [place] [address] [phone]                  The results of significant diagnostics from this hospitalization (including imaging, microbiology, ancillary and laboratory) are listed below for reference.    Significant Diagnostic Studies: CT ABDOMEN PELVIS WO CONTRAST  Result Date: 01/15/2021 CLINICAL DATA:  Epigastric abdominal pain, periumbilical abdominal pain EXAM: CT ABDOMEN AND PELVIS WITHOUT CONTRAST TECHNIQUE: Multidetector CT imaging of the abdomen and pelvis was performed following the standard  protocol without IV contrast. COMPARISON:  Right upper quadrant FINDINGS: Lower chest: The visualized lung bases are clear bilaterally. The visualized heart and pericardium are unremarkable. Small hiatal hernia. Hepatobiliary: No focal liver abnormality is seen. No gallstones, gallbladder wall thickening, or biliary dilatation. Pancreas: Unremarkable Spleen: Unremarkable Adrenals/Urinary Tract: Adrenal glands are unremarkable. Kidneys are normal, without renal calculi, focal lesion, or hydronephrosis. Bladder is unremarkable. Stomach/Bowel: Stomach is within normal limits. Appendix appears normal. No evidence of bowel wall thickening, distention, or inflammatory changes. No free intraperitoneal gas or fluid. Vascular/Lymphatic: No significant vascular findings are present. No enlarged abdominal or pelvic lymph nodes. Reproductive: Uterus and bilateral adnexa are unremarkable. Other: Tiny fat containing umbilical hernia is unchanged. Musculoskeletal: No acute bone abnormality. No lytic or blastic bone lesions identified. IMPRESSION: No acute intra-abdominal pathology identified. No definite radiographic explanation for the patient's reported symptoms. Electronically Signed  By: Helyn Numbers MD   On: 01/15/2021 03:31   MR 3D Recon At Scanner  Result Date: 01/16/2021 CLINICAL DATA:  Abdominal pain, gallstones, elevated LFTs, biliary obstruction suspected EXAM: MRI ABDOMEN WITHOUT AND WITH CONTRAST (INCLUDING MRCP) TECHNIQUE: Multiplanar multisequence MR imaging of the abdomen was performed both before and after the administration of intravenous contrast. Heavily T2-weighted images of the biliary and pancreatic ducts were obtained, and three-dimensional MRCP images were rendered by post processing. CONTRAST:  9mL GADAVIST GADOBUTROL 1 MMOL/ML IV SOLN COMPARISON:  CT abdomen pelvis, 01/15/2021 FINDINGS: Lower chest: No acute findings. Hepatobiliary: No mass or other parenchymal abnormality identified. Tiny  gallstones in the dependent gallbladder. No gallbladder wall thickening or pericholecystic fluid. No biliary ductal dilatation. No evidence of filling defect to the ampulla. Pancreas: No mass, inflammatory changes, or other parenchymal abnormality identified. Spleen:  Within normal limits in size and appearance. Adrenals/Urinary Tract: No masses identified. No evidence of hydronephrosis. Stomach/Bowel: Visualized portions within the abdomen are unremarkable. Vascular/Lymphatic: No pathologically enlarged lymph nodes identified. No abdominal aortic aneurysm demonstrated. Other:  None. Musculoskeletal: No suspicious bone lesions identified. IMPRESSION: 1. Tiny gallstones in the dependent gallbladder. No gallbladder wall thickening or pericholecystic fluid. 2. No biliary ductal dilatation. No evidence of filling defect to the ampulla. 3. No acute findings in the abdomen. Electronically Signed   By: Lauralyn Primes M.D.   On: 01/16/2021 11:28   MR ABDOMEN MRCP W WO CONTAST  Result Date: 01/16/2021 CLINICAL DATA:  Abdominal pain, gallstones, elevated LFTs, biliary obstruction suspected EXAM: MRI ABDOMEN WITHOUT AND WITH CONTRAST (INCLUDING MRCP) TECHNIQUE: Multiplanar multisequence MR imaging of the abdomen was performed both before and after the administration of intravenous contrast. Heavily T2-weighted images of the biliary and pancreatic ducts were obtained, and three-dimensional MRCP images were rendered by post processing. CONTRAST:  9mL GADAVIST GADOBUTROL 1 MMOL/ML IV SOLN COMPARISON:  CT abdomen pelvis, 01/15/2021 FINDINGS: Lower chest: No acute findings. Hepatobiliary: No mass or other parenchymal abnormality identified. Tiny gallstones in the dependent gallbladder. No gallbladder wall thickening or pericholecystic fluid. No biliary ductal dilatation. No evidence of filling defect to the ampulla. Pancreas: No mass, inflammatory changes, or other parenchymal abnormality identified. Spleen:  Within normal limits  in size and appearance. Adrenals/Urinary Tract: No masses identified. No evidence of hydronephrosis. Stomach/Bowel: Visualized portions within the abdomen are unremarkable. Vascular/Lymphatic: No pathologically enlarged lymph nodes identified. No abdominal aortic aneurysm demonstrated. Other:  None. Musculoskeletal: No suspicious bone lesions identified. IMPRESSION: 1. Tiny gallstones in the dependent gallbladder. No gallbladder wall thickening or pericholecystic fluid. 2. No biliary ductal dilatation. No evidence of filling defect to the ampulla. 3. No acute findings in the abdomen. Electronically Signed   By: Lauralyn Primes M.D.   On: 01/16/2021 11:28   US Abdomen Limited RUQ (LIVER/GB)  Result Date: 01/15/2021 CLINICAL DATA:  Epigastric pain and elevated LFTs EXAM: ULTRASOUND ABDOMEN LIMITED RIGHT UPPER QUADRANT COMPARISON:  CT from earlier in the same day. FINDINGS: Gallbladder: Gallbladder is well distended. Small gallstones are noted. No wall thickening or pericholecystic fluid is seen. Common bile duct: Diameter: 6.3 mm. This is at the upper limits of normal in size for the patient's age. Liver: No focal lesion identified. Within normal limits in parenchymal echogenicity. Portal vein is patent on color Doppler imaging with normal direction of blood flow towards the liver. Other: None. IMPRESSION: Cholelithiasis without complicating factors. Electronically Signed   By: Alcide Clever M.D.   On: 01/15/2021 16:43    Microbiology: Recent  Results (from the past 240 hour(s))  Resp Panel by RT-PCR (Flu A&B, Covid) Nasopharyngeal Swab     Status: None   Collection Time: 01/15/21  3:30 AM   Specimen: Nasopharyngeal Swab; Nasopharyngeal(NP) swabs in vial transport medium  Result Value Ref Range Status   SARS Coronavirus 2 by RT PCR NEGATIVE NEGATIVE Final    Comment: (NOTE) SARS-CoV-2 target nucleic acids are NOT DETECTED.  The SARS-CoV-2 RNA is generally detectable in upper respiratory specimens during  the acute phase of infection. The lowest concentration of SARS-CoV-2 viral copies this assay can detect is 138 copies/mL. A negative result does not preclude SARS-Cov-2 infection and should not be used as the sole basis for treatment or other patient management decisions. A negative result may occur with  improper specimen collection/handling, submission of specimen other than nasopharyngeal swab, presence of viral mutation(s) within the areas targeted by this assay, and inadequate number of viral copies(<138 copies/mL). A negative result must be combined with clinical observations, patient history, and epidemiological information. The expected result is Negative.  Fact Sheet for Patients:  BloggerCourse.com  Fact Sheet for Healthcare Providers:  SeriousBroker.it  This test is no t yet approved or cleared by the [place] FDA and  has been authorized for detection and/or diagnosis of SARS-CoV-2 by FDA under an Emergency Use Authorization (EUA). This EUA will remain  in effect (meaning this test can be used) for the duration of the COVID-19 declaration under Section 564(b)(1) of the Act, 21 U.S.C.section 360bbb-3(b)(1), unless the authorization is terminated  or revoked sooner.       Influenza A by PCR NEGATIVE NEGATIVE Final   Influenza B by PCR NEGATIVE NEGATIVE Final    Comment: (NOTE) The Xpert Xpress SARS-CoV-2/FLU/RSV plus assay is intended as an aid in the diagnosis of influenza from Nasopharyngeal swab specimens and should not be used as a sole basis for treatment. Nasal washings and aspirates are unacceptable for Xpert Xpress SARS-CoV-2/FLU/RSV testing.  Fact Sheet for Patients: BloggerCourse.com  Fact Sheet for Healthcare Providers: SeriousBroker.it  This test is not yet approved or cleared by the [place] FDA and has been authorized for detection and/or  diagnosis of SARS-CoV-2 by FDA under an Emergency Use Authorization (EUA). This EUA will remain in effect (meaning this test can be used) for the duration of the COVID-19 declaration under Section 564(b)(1) of the Act, 21 U.S.C. section 360bbb-3(b)(1), unless the authorization is terminated or revoked.  Performed at [facility], [address] [place], [place] [address]      Labs: Basic Metabolic Panel: Recent Labs  Lab 01/14/21 2310 01/15/21 1212 01/16/21 0507 01/17/21 0614  NA 139 138 139 138  K 3.5 3.5 3.4* 3.3*  CL 105 110 109 108  CO2 24 19* 22 24  GLUCOSE 99 82 78 86  BUN 9 6 <5* <5*  CREATININE 0.56 0.42* 0.48 0.61  CALCIUM 9.3 8.2* 8.4* 8.6*   Liver Function Tests: Recent Labs  Lab 01/14/21 2310 01/15/21 1212 01/16/21 0507 01/17/21 0614  AST 643* 380* 169* 101*  ALT 564* 454* 330* 250*  ALKPHOS 281* 276* 266* 301*  BILITOT 3.4* 2.5* 1.3* 1.0  PROT 7.1 6.7 6.6 7.2  ALBUMIN 4.3 3.6 3.5 3.6   Recent Labs  Lab 01/14/21 2310  LIPASE 36   No results for input(s): AMMONIA in the last 168 hours. CBC: Recent Labs  Lab 01/14/21 2310 01/15/21 1212 01/16/21 0507 01/17/21 0614  WBC 7.7 6.9 6.6 6.1  NEUTROABS  --  4.5  --   --   HGB 13.8 12.5 12.3 12.6  HCT 42.4 40.2 39.1 40.0  MCV 84.1 88.0 87.9 87.9  PLT 303 225 228 259        Signed:  Meredeth IdeGagan S Keryl Gholson MD.  [facility] 01/17/2021, 11:44 AM

## 2021-01-18 NOTE — Progress Notes (Signed)
Based on what you shared with me, worsening pain since coming home from the hospital, I feel your condition warrants further evaluation and I recommend that you be seen in a face to face visit. We are unable to prescribed controlled substances for pain through Evisits.  It is also important to have your pain re-evaluated in-person if unable to control at home.    NOTE: There will be NO CHARGE for this eVisit   If you are having a true medical emergency please call 911.      For an urgent face to face visit, [place] has six urgent care centers for your convenience:     [facility] at [facility] Directions [phone] [address] [place], [place] [address]    [facility]) Get Driving Directions [phone] [address] [place], [place] [address]  [facility]) Get Driving Directions [phone] [address] [place],  [place]  [address]  [facility] Care at [facility] Get Driving Directions [phone] [address] [place], [place] [address]   [facility] Health Urgent Care at [facility] Get Driving Directions  [phone] [address] [place], [place] [address]   [facility] Care at [facility] Directions [phone] [address] [place], [place] [address]  Your MyChart E-visit questionnaire answers were reviewed by a board certified advanced clinical practitioner to complete your personal care plan based on your specific symptoms.  Thank you for using e-Visits.   Approximately 5 minutes was spent documenting and reviewing patient's chart.

## 2021-01-21 ENCOUNTER — Other Ambulatory Visit: Payer: Self-pay

## 2021-01-21 ENCOUNTER — Ambulatory Visit: Payer: Self-pay | Admitting: Surgery

## 2021-01-21 ENCOUNTER — Encounter (HOSPITAL_COMMUNITY): Payer: Self-pay | Admitting: Surgery

## 2021-01-21 NOTE — Progress Notes (Signed)
Notified Toniann Fail at [facility] office that we need pre op orders for patient's surgery scheduled for 01/22/21 at 0930. Toniann Fail said she will contact Dr. Corliss Skains. Notified Shonna Chock and Rica Mast  via secure chat that pt's chart needs review due to seizure disorder and elevated AST and ALT.

## 2021-01-21 NOTE — Progress Notes (Signed)
PCP - Dr. Merrilee Jansky  Cardiologist - Denies  Chest x-ray - 03/22/20 (E)  EKG - 01/15/21 (E)  Stress Test - Denies  ECHO - Denies  Cardiac Cath - Denies  AICD-na PM-na LOOP-na  Sleep Study - Yes- Positive CPAP - Denies  LABS- 01/17/21: CBC, CMP 01/22/21: POC UPreg  ASA- Denies  ERAS- No  HA1C- Denies  Anesthesia- Yes- recent hospitalization- PA Revonda Standard and DNP Marylene Land with anesthesia made aware, per Harriett Sine, RN  Pt denies having chest pain, sob, or fever during the pre-op phone call. All instructions explained to the pt, with a verbal understanding of the material including: as of today, stop taking all Aspirin (unless instructed by your doctor) and Other Aspirin containing products, Vitamins, Fish oils, and Herbal medications. Also stop all NSAIDS i.e. Advil, Ibuprofen, Motrin, Aleve, Anaprox, Naproxen, BC, Goody Powders, and all Supplements.  Pt also instructed to wear a mask and social distance if she has to go out prior to her surgery. The opportunity to ask questions was provided.    Coronavirus Screening  Have you experienced the following symptoms:  Cough yes/no: No Fever (>100.56F)  yes/no: No Runny nose yes/no: No Sore throat yes/no: No Difficulty breathing/shortness of breath  yes/no: No  Have you or a family member traveled in the last 14 days and where? yes/no: No   If the patient indicates "YES" to the above questions, their PAT will be rescheduled to limit the exposure to others and, the surgeon will be notified. THE PATIENT WILL NEED TO BE ASYMPTOMATIC FOR 14 DAYS.   If the patient is not experiencing any of these symptoms, the PAT nurse will instruct them to NOT bring anyone with them to their appointment since they may have these symptoms or traveled as well.   Please remind your patients and families that hospital visitation restrictions are in effect and the importance of the restrictions.

## 2021-01-22 ENCOUNTER — Encounter (HOSPITAL_COMMUNITY)
Admission: RE | Disposition: A | Discharge status: Home / Self Care | Payer: Self-pay | Source: Home / Self Care | Attending: Surgery

## 2021-01-22 ENCOUNTER — Ambulatory Visit (HOSPITAL_COMMUNITY): Payer: Medicaid Other | Admitting: Emergency Medicine

## 2021-01-22 ENCOUNTER — Encounter (HOSPITAL_COMMUNITY): Payer: Self-pay | Admitting: Surgery

## 2021-01-22 ENCOUNTER — Ambulatory Visit (HOSPITAL_COMMUNITY): Payer: Medicaid Other

## 2021-01-22 ENCOUNTER — Ambulatory Visit (HOSPITAL_COMMUNITY)
Admission: RE | Admit: 2021-01-22 | Discharge: 2021-01-22 | Disposition: A | Discharge status: Home / Self Care | Payer: Medicaid Other | Attending: Surgery | Admitting: Surgery

## 2021-01-22 ENCOUNTER — Other Ambulatory Visit: Payer: Self-pay

## 2021-01-22 DIAGNOSIS — Z87891 Personal history of nicotine dependence: Secondary | ICD-10-CM | POA: Insufficient documentation

## 2021-01-22 DIAGNOSIS — Z886 Allergy status to analgesic agent status: Secondary | ICD-10-CM | POA: Diagnosis not present

## 2021-01-22 DIAGNOSIS — Z79899 Other long term (current) drug therapy: Secondary | ICD-10-CM | POA: Diagnosis not present

## 2021-01-22 DIAGNOSIS — Z881 Allergy status to other antibiotic agents status: Secondary | ICD-10-CM | POA: Diagnosis not present

## 2021-01-22 DIAGNOSIS — J45909 Unspecified asthma, uncomplicated: Secondary | ICD-10-CM | POA: Diagnosis not present

## 2021-01-22 DIAGNOSIS — G40909 Epilepsy, unspecified, not intractable, without status epilepticus: Secondary | ICD-10-CM | POA: Insufficient documentation

## 2021-01-22 DIAGNOSIS — Z91040 Latex allergy status: Secondary | ICD-10-CM | POA: Diagnosis not present

## 2021-01-22 DIAGNOSIS — K8012 Calculus of gallbladder with acute and chronic cholecystitis without obstruction: Secondary | ICD-10-CM | POA: Insufficient documentation

## 2021-01-22 DIAGNOSIS — Z888 Allergy status to other drugs, medicaments and biological substances status: Secondary | ICD-10-CM | POA: Insufficient documentation

## 2021-01-22 DIAGNOSIS — Z885 Allergy status to narcotic agent status: Secondary | ICD-10-CM | POA: Diagnosis not present

## 2021-01-22 DIAGNOSIS — Z419 Encounter for procedure for purposes other than remedying health state, unspecified: Secondary | ICD-10-CM

## 2021-01-22 DIAGNOSIS — K219 Gastro-esophageal reflux disease without esophagitis: Secondary | ICD-10-CM | POA: Diagnosis not present

## 2021-01-22 DIAGNOSIS — I10 Essential (primary) hypertension: Secondary | ICD-10-CM | POA: Diagnosis not present

## 2021-01-22 HISTORY — DX: Hepatomegaly, not elsewhere classified: R16.0

## 2021-01-22 HISTORY — PX: CHOLECYSTECTOMY: SHX55

## 2021-01-22 HISTORY — DX: Gastro-esophageal reflux disease without esophagitis: K21.9

## 2021-01-22 HISTORY — DX: Sleep apnea, unspecified: G47.30

## 2021-01-22 HISTORY — DX: Other complications of anesthesia, initial encounter: T88.59XA

## 2021-01-22 HISTORY — DX: Bipolar disorder, unspecified: F31.9

## 2021-01-22 LAB — POCT PREGNANCY, URINE: Preg Test, Ur: NEGATIVE

## 2021-01-22 SURGERY — LAPAROSCOPIC CHOLECYSTECTOMY WITH INTRAOPERATIVE CHOLANGIOGRAM
Anesthesia: General

## 2021-01-22 MED ORDER — CHLORHEXIDINE GLUCONATE CLOTH 2 % EX PADS: 6.0000 | MEDICATED_PAD | Freq: Once | CUTANEOUS | Status: DC

## 2021-01-22 MED ORDER — BUPIVACAINE-EPINEPHRINE (PF) 0.25% -1:200000 IJ SOLN
INTRAMUSCULAR | Status: AC
  Filled 2021-01-22: qty 30

## 2021-01-22 MED ORDER — SODIUM CHLORIDE 0.9 % IV SOLN
INTRAVENOUS | Status: DC | PRN
  Administered 2021-01-22: 5 mL

## 2021-01-22 MED ORDER — LIDOCAINE 2% (20 MG/ML) 5 ML SYRINGE
INTRAMUSCULAR | Status: DC | PRN
  Administered 2021-01-22: 40 mg via INTRAVENOUS

## 2021-01-22 MED ORDER — MIDAZOLAM HCL 5 MG/5ML IJ SOLN
INTRAMUSCULAR | Status: DC | PRN
  Administered 2021-01-22: 2 mg via INTRAVENOUS

## 2021-01-22 MED ORDER — LACTATED RINGERS IV SOLN: INTRAVENOUS | Status: DC

## 2021-01-22 MED ORDER — BUPIVACAINE-EPINEPHRINE 0.25% -1:200000 IJ SOLN
INTRAMUSCULAR | Status: DC | PRN
  Administered 2021-01-22: 10 mL

## 2021-01-22 MED ORDER — SODIUM CHLORIDE 0.9 % IR SOLN
Status: DC | PRN
  Administered 2021-01-22: 1000 mL

## 2021-01-22 MED ORDER — MIDAZOLAM HCL 2 MG/2ML IJ SOLN
INTRAMUSCULAR | Status: AC
  Filled 2021-01-22: qty 2

## 2021-01-22 MED ORDER — LIDOCAINE 2% (20 MG/ML) 5 ML SYRINGE
INTRAMUSCULAR | Status: AC
  Filled 2021-01-22: qty 5

## 2021-01-22 MED ORDER — FENTANYL CITRATE (PF) 250 MCG/5ML IJ SOLN
INTRAMUSCULAR | Status: DC | PRN
  Administered 2021-01-22: 50 ug via INTRAVENOUS
  Administered 2021-01-22 (×2): 100 ug via INTRAVENOUS

## 2021-01-22 MED ORDER — ONDANSETRON HCL 4 MG/2ML IJ SOLN
INTRAMUSCULAR | Status: DC | PRN
  Administered 2021-01-22: 4 mg via INTRAVENOUS

## 2021-01-22 MED ORDER — PROPOFOL 10 MG/ML IV BOLUS
INTRAVENOUS | Status: DC | PRN
  Administered 2021-01-22: 150 mg via INTRAVENOUS

## 2021-01-22 MED ORDER — OXYCODONE HCL 5 MG/5ML PO SOLN
5.0000 mg | Freq: Once | ORAL | Status: DC | PRN
Start: 2021-01-22 — End: 2021-01-22

## 2021-01-22 MED ORDER — ACETAMINOPHEN 500 MG PO TABS
1000.0000 mg | ORAL_TABLET | ORAL | Status: AC
  Administered 2021-01-22: 1000 mg via ORAL
  Filled 2021-01-22: qty 2

## 2021-01-22 MED ORDER — 0.9 % SODIUM CHLORIDE (POUR BTL) OPTIME
TOPICAL | Status: DC | PRN
  Administered 2021-01-22: 1000 mL

## 2021-01-22 MED ORDER — ORAL CARE MOUTH RINSE: 15.0000 mL | Freq: Once | OROMUCOSAL | Status: AC

## 2021-01-22 MED ORDER — ROCURONIUM BROMIDE 100 MG/10ML IV SOLN
INTRAVENOUS | Status: DC | PRN
  Administered 2021-01-22: 60 mg via INTRAVENOUS

## 2021-01-22 MED ORDER — PROPOFOL 10 MG/ML IV BOLUS
INTRAVENOUS | Status: AC
  Filled 2021-01-22: qty 40

## 2021-01-22 MED ORDER — ACETAMINOPHEN 160 MG/5ML PO SOLN: 1000.0000 mg | Freq: Once | ORAL | Status: DC | PRN

## 2021-01-22 MED ORDER — ROCURONIUM BROMIDE 10 MG/ML (PF) SYRINGE
PREFILLED_SYRINGE | INTRAVENOUS | Status: AC
  Filled 2021-01-22: qty 10

## 2021-01-22 MED ORDER — DEXAMETHASONE SODIUM PHOSPHATE 10 MG/ML IJ SOLN
INTRAMUSCULAR | Status: DC | PRN
  Administered 2021-01-22: 10 mg via INTRAVENOUS

## 2021-01-22 MED ORDER — CHLORHEXIDINE GLUCONATE 0.12 % MT SOLN
15.0000 mL | Freq: Once | OROMUCOSAL | Status: AC
  Administered 2021-01-22: 15 mL via OROMUCOSAL
  Filled 2021-01-22: qty 15

## 2021-01-22 MED ORDER — VANCOMYCIN HCL IN DEXTROSE 1-5 GM/200ML-% IV SOLN
1000.0000 mg | INTRAVENOUS | Status: AC
  Administered 2021-01-22: 1000 mg via INTRAVENOUS
  Filled 2021-01-22: qty 200

## 2021-01-22 MED ORDER — FENTANYL CITRATE (PF) 100 MCG/2ML IJ SOLN
INTRAMUSCULAR | Status: AC
  Filled 2021-01-22: qty 2

## 2021-01-22 MED ORDER — OXYCODONE HCL 5 MG PO TABS
5.0000 mg | ORAL_TABLET | Freq: Once | ORAL | Status: DC | PRN
Start: 2021-01-22 — End: 2021-01-22

## 2021-01-22 MED ORDER — FENTANYL CITRATE (PF) 100 MCG/2ML IJ SOLN
25.0000 ug | INTRAMUSCULAR | Status: DC | PRN
  Administered 2021-01-22: 50 ug via INTRAVENOUS

## 2021-01-22 MED ORDER — PROCHLORPERAZINE MALEATE 5 MG PO TABS: 5.0000 mg | ORAL_TABLET | Freq: Four times a day (QID) | ORAL | 0 refills | Status: DC | PRN

## 2021-01-22 MED ORDER — ACETAMINOPHEN 10 MG/ML IV SOLN
INTRAVENOUS | Status: AC
  Filled 2021-01-22: qty 100

## 2021-01-22 MED ORDER — SUGAMMADEX SODIUM 200 MG/2ML IV SOLN
INTRAVENOUS | Status: DC | PRN
  Administered 2021-01-22: 300 mg via INTRAVENOUS

## 2021-01-22 MED ORDER — ACETAMINOPHEN 10 MG/ML IV SOLN
1000.0000 mg | Freq: Once | INTRAVENOUS | Status: DC | PRN
  Administered 2021-01-22: 1000 mg via INTRAVENOUS

## 2021-01-22 MED ORDER — FENTANYL CITRATE (PF) 250 MCG/5ML IJ SOLN
INTRAMUSCULAR | Status: AC
  Filled 2021-01-22: qty 5

## 2021-01-22 MED ORDER — ACETAMINOPHEN 500 MG PO TABS: 1000.0000 mg | ORAL_TABLET | Freq: Once | ORAL | Status: DC | PRN

## 2021-01-22 MED ORDER — OXYCODONE HCL 5 MG PO TABS: 5.0000 mg | ORAL_TABLET | Freq: Four times a day (QID) | ORAL | 0 refills | Status: DC | PRN

## 2021-01-22 SURGICAL SUPPLY — 45 items
APPLIER CLIP ROT 10 11.4 M/L (STAPLE) ×2
BAG COUNTER SPONGE SURGICOUNT (BAG) ×2 IMPLANT
BENZOIN TINCTURE PRP APPL 2/3 (GAUZE/BANDAGES/DRESSINGS) ×2 IMPLANT
BLADE CLIPPER SURG (BLADE) IMPLANT
CANISTER SUCT 3000ML PPV (MISCELLANEOUS) ×2 IMPLANT
CHLORAPREP W/TINT 26 (MISCELLANEOUS) ×2 IMPLANT
CLIP APPLIE ROT 10 11.4 M/L (STAPLE) ×1 IMPLANT
COVER MAYO STAND STRL (DRAPES) ×2 IMPLANT
COVER SURGICAL LIGHT HANDLE (MISCELLANEOUS) ×2 IMPLANT
DRAPE C-ARM 42X120 X-RAY (DRAPES) ×2 IMPLANT
DRSG TEGADERM 2-3/8X2-3/4 SM (GAUZE/BANDAGES/DRESSINGS) ×6 IMPLANT
DRSG TEGADERM 4X10 (GAUZE/BANDAGES/DRESSINGS) IMPLANT
DRSG TEGADERM 4X4.75 (GAUZE/BANDAGES/DRESSINGS) ×2 IMPLANT
ELECT REM PT RETURN 9FT ADLT (ELECTROSURGICAL) ×2
ELECTRODE REM PT RTRN 9FT ADLT (ELECTROSURGICAL) ×1 IMPLANT
GAUZE SPONGE 2X2 8PLY STRL LF (GAUZE/BANDAGES/DRESSINGS) ×1 IMPLANT
GLOVE SURG ENC MOIS LTX SZ7 (GLOVE) ×2 IMPLANT
GLOVE SURG UNDER POLY LF SZ7.5 (GLOVE) ×2 IMPLANT
GOWN STRL REUS W/ TWL LRG LVL3 (GOWN DISPOSABLE) ×3 IMPLANT
GOWN STRL REUS W/TWL LRG LVL3 (GOWN DISPOSABLE) ×6
IV CATH 14GX2 1/4 (CATHETERS) ×2 IMPLANT
KIT BASIN OR (CUSTOM PROCEDURE TRAY) ×2 IMPLANT
KIT TURNOVER KIT B (KITS) ×2 IMPLANT
NS IRRIG 1000ML POUR BTL (IV SOLUTION) ×2 IMPLANT
PAD ARMBOARD 7.5X6 YLW CONV (MISCELLANEOUS) ×2 IMPLANT
POUCH RETRIEVAL ECOSAC 10 (ENDOMECHANICALS) ×1 IMPLANT
POUCH RETRIEVAL ECOSAC 10MM (ENDOMECHANICALS) ×2
POUCH SPECIMEN RETRIEVAL 10MM (ENDOMECHANICALS) IMPLANT
SCISSORS LAP 5X35 DISP (ENDOMECHANICALS) ×2 IMPLANT
SET CHOLANGIOGRAPH 5 50 .035 (SET/KITS/TRAYS/PACK) ×2 IMPLANT
SET CHOLANGIOGRAPHY FRANKLIN (SET/KITS/TRAYS/PACK) ×2 IMPLANT
SET IRRIG TUBING LAPAROSCOPIC (IRRIGATION / IRRIGATOR) ×2 IMPLANT
SET TUBE SMOKE EVAC HIGH FLOW (TUBING) ×2 IMPLANT
SLEEVE ENDOPATH XCEL 5M (ENDOMECHANICALS) ×2 IMPLANT
SPECIMEN JAR SMALL (MISCELLANEOUS) ×2 IMPLANT
SPONGE GAUZE 2X2 STER 10/PKG (GAUZE/BANDAGES/DRESSINGS) ×1
STRIP CLOSURE SKIN 1/2X4 (GAUZE/BANDAGES/DRESSINGS) ×2 IMPLANT
SUT MNCRL AB 4-0 PS2 18 (SUTURE) ×2 IMPLANT
TOWEL GREEN STERILE (TOWEL DISPOSABLE) ×2 IMPLANT
TOWEL GREEN STERILE FF (TOWEL DISPOSABLE) ×2 IMPLANT
TRAY LAPAROSCOPIC MC (CUSTOM PROCEDURE TRAY) ×2 IMPLANT
TROCAR XCEL BLUNT TIP 100MML (ENDOMECHANICALS) ×2 IMPLANT
TROCAR XCEL NON-BLD 11X100MML (ENDOMECHANICALS) ×2 IMPLANT
TROCAR XCEL NON-BLD 5MMX100MML (ENDOMECHANICALS) ×2 IMPLANT
WATER STERILE IRR 1000ML POUR (IV SOLUTION) ×2 IMPLANT

## 2021-01-22 NOTE — Op Note (Signed)
Laparoscopic Cholecystectomy with IOC Procedure Note  Indications: This patient presents with symptomatic gallbladder disease and will undergo laparoscopic cholecystectomy.  Pre-operative Diagnosis: Calculus of gallbladder with acute cholecystitis, without mention of obstruction  Post-operative Diagnosis: Same  Surgeon: Wynona Luna   Assistants: Carl Best, PA-C  Anesthesia: General endotracheal anesthesia  ASA Class: 1  Procedure Details  The patient was seen again in the Holding Room. The risks, benefits, complications, treatment options, and expected outcomes were discussed with the patient. The possibilities of reaction to medication, pulmonary aspiration, perforation of viscus, bleeding, recurrent infection, finding a normal gallbladder, the need for additional procedures, failure to diagnose a condition, the possible need to convert to an open procedure, and creating a complication requiring transfusion or operation were discussed with the patient. The likelihood of improving the patient's symptoms with return to their baseline status is good.  The patient and/or family concurred with the proposed plan, giving informed consent. The site of surgery properly noted. The patient was taken to Operating Room, identified as Jacqueline Jones and the procedure verified as Laparoscopic Cholecystectomy with Intraoperative Cholangiogram. A Time Out was held and the above information confirmed.  Prior to the induction of general anesthesia, antibiotic prophylaxis was administered. General endotracheal anesthesia was then administered and tolerated well. After the induction, the abdomen was prepped with Chloraprep and draped in the sterile fashion. The patient was positioned in the supine position.  Local anesthetic agent was injected into the skin above the umbilicus and an incision made. We dissected down to the abdominal fascia with blunt dissection.  The fascia was incised vertically and we  entered the peritoneal cavity bluntly.  A pursestring suture of 0-Vicryl was placed around the fascial opening.  The Hasson cannula was inserted and secured with the stay suture.  Pneumoperitoneum was then created with CO2 and tolerated well without any adverse changes in the patient's vital signs. An 11-mm port was placed in the subxiphoid position.  Two 5-mm ports were placed in the right upper quadrant. All skin incisions were infiltrated with a local anesthetic agent before making the incision and placing the trocars.   We positioned the patient in reverse Trendelenburg, tilted slightly to the patient's left.  There is some cloudy greenish fluid around the liver and the gallbladder.  This fluid was suctioned out.  The gallbladder was identified, the fundus grasped and retracted cephalad. The gallbladder is moderately thickened with some omental adhesions.  Adhesions were lysed bluntly and with the electrocautery where indicated, taking care not to injure any adjacent organs or viscus. The infundibulum was grasped and retracted laterally, exposing the peritoneum overlying the triangle of Calot. This was then divided and exposed in a blunt fashion. A critical view of the cystic duct and cystic artery was obtained.  The cystic duct was clearly identified and bluntly dissected circumferentially. The cystic duct was ligated with a clip distally.   An incision was made in the cystic duct and the [facility] cholangiogram catheter introduced. The catheter was secured using a clip. A cholangiogram was then obtained which showed good visualization of the distal and proximal biliary tree with no sign of filling defects or obstruction.  Contrast flowed easily into the duodenum. The catheter was then removed.   The cystic duct was then ligated with clips and divided. The cystic artery was identified, dissected free, ligated with clips and divided as well.   The gallbladder was dissected from the liver bed in retrograde  fashion with the electrocautery. The  gallbladder was removed and placed in an Endocatch sac. The liver bed was irrigated and inspected. Hemostasis was achieved with the electrocautery. Copious irrigation was utilized and was repeatedly aspirated until clear.  The gallbladder and Endocatch sac were then removed through the umbilical port site.  The pursestring suture was used to close the umbilical fascia.    We again inspected the right upper quadrant for hemostasis.  Pneumoperitoneum was released as we removed the trocars.  4-0 Monocryl was used to close the skin.   Benzoin, steri-strips, and clean dressings were applied. The patient was then extubated and brought to the recovery room in stable condition. Instrument, sponge, and needle counts were correct at closure and at the conclusion of the case.   Findings: Cholecystitis with Cholelithiasis  Estimated Blood Loss: Minimal         Drains: none         Specimens: Gallbladder           Complications: None; patient tolerated the procedure well.         Disposition: PACU - hemodynamically stable.         Condition: stable  Wilmon Arms. Corliss Skains, MD, [facility]  General Surgery   01/22/2021 11:20 AM

## 2021-01-22 NOTE — Transfer of Care (Signed)
Immediate Anesthesia Transfer of Care Note  Patient: Jacqueline Jones  Procedure(s) Performed: LAPAROSCOPIC CHOLECYSTECTOMY WITH INTRAOPERATIVE CHOLANGIOGRAM  Patient Location: PACU  Anesthesia Type:General  Level of Consciousness: awake, alert  and oriented  Airway & Oxygen Therapy: Patient Spontanous Breathing and Patient connected to face mask oxygen  Post-op Assessment: Report given to RN, Post -op Vital signs reviewed and stable and Patient moving all extremities X 4  Post vital signs: Reviewed and stable  Last Vitals:  Vitals Value Taken Time  BP 127/97 01/22/21 1129  Temp    Pulse 115 01/22/21 1131  Resp 15 01/22/21 1131  SpO2 99 % 01/22/21 1131  Vitals shown include unvalidated device data.  Last Pain:  Vitals:   01/22/21 0749  TempSrc:   PainSc: 6       Patients Stated Pain Goal: 2 (01/22/21 0749)  Complications: No notable events documented.

## 2021-01-22 NOTE — Discharge Instructions (Signed)
[facility] ______[facility] LAPAROSCOPIC SURGERY: POST OP INSTRUCTIONS Always review your discharge instruction sheet given to you by the facility where your surgery was performed. IF YOU HAVE DISABILITY OR FAMILY LEAVE FORMS, YOU MUST BRING THEM TO THE OFFICE FOR PROCESSING.   DO NOT GIVE THEM TO YOUR DOCTOR.  A prescription for pain medication may be given to you upon discharge.  Take your pain medication as prescribed, if needed.  If narcotic pain medicine is not needed, then you may take acetaminophen (Tylenol) or ibuprofen (Advil) as needed. Take your usually prescribed medications unless otherwise directed. If you need a refill on your pain medication, please contact your pharmacy.  They will contact our office to request authorization. Prescriptions will not be filled after 5pm or on week-ends. You should follow a light diet the first few days after arrival home, such as soup and crackers, etc.  Be sure to include lots of fluids daily. Most patients will experience some swelling and bruising in the area of the incisions.  Ice packs will help.  Swelling and bruising can take several days to resolve.  It is common to experience some constipation if taking pain medication after surgery.  Increasing fluid intake and taking a stool softener (such as Colace) will usually help or prevent this problem from occurring.  A mild laxative (Milk of Magnesia or Miralax) should be taken according to package instructions if there are no bowel movements after 48 hours. Unless discharge instructions indicate otherwise, you may remove your bandages 24-48 hours after surgery, and you may shower at that time.  You may have steri-strips (small skin tapes) in place directly over the incision.  These strips should be left on the skin for 7-10 days.  If your surgeon used skin glue on the incision, you may shower in 24 hours.  The glue will flake off over the next 2-3 weeks.  Any sutures or staples will be  removed at the office during your follow-up visit. ACTIVITIES:  You may resume regular (light) daily activities beginning the next day--such as daily self-care, walking, climbing stairs--gradually increasing activities as tolerated.  You may have sexual intercourse when it is comfortable.  Refrain from any heavy lifting or straining until approved by your doctor. You may drive when you are no longer taking prescription pain medication, you can comfortably wear a seatbelt, and you can safely maneuver your car and apply brakes. RETURN TO WORK:  __________________________________________________________ You should see your doctor in the office for a follow-up appointment approximately 2-3 weeks after your surgery.  Make sure that you call for this appointment within a day or two after you arrive home to insure a convenient appointment time. OTHER INSTRUCTIONS: __________________________________________________________________________________________________________________________ __________________________________________________________________________________________________________________________ WHEN TO CALL YOUR DOCTOR: Fever over 101.0 Inability to urinate Continued bleeding from incision. Increased pain, redness, or drainage from the incision. Increasing abdominal pain  The clinic staff is available to answer your questions during regular business hours.  Please don't hesitate to call and ask to speak to one of the nurses for clinical concerns.  If you have a medical emergency, go to the nearest emergency room or call 911.  A surgeon from [facility] is always on call at the hospital. [address], [address], [place], [place]  [address] ? [address] [address], [place], [place]   [address] [phone] ? [phone] ? FAX [phone] Web site: [email or web address]  

## 2021-01-22 NOTE — Anesthesia Procedure Notes (Signed)
Procedure Name: Intubation Date/Time: 01/22/2021 10:07 AM Performed by: Hewitt Blade, CRNA Pre-anesthesia Checklist: Patient identified, Emergency Drugs available, Suction available and Patient being monitored Patient Re-evaluated:Patient Re-evaluated prior to induction Oxygen Delivery Method: Circle system utilized Preoxygenation: Pre-oxygenation with 100% oxygen Induction Type: IV induction Ventilation: Mask ventilation without difficulty Laryngoscope Size: Mac and 3 Grade View: Grade I Tube type: Oral Tube size: 7.0 mm Number of attempts: 1 Airway Equipment and Method: Stylet Placement Confirmation: ETT inserted through vocal cords under direct vision, positive ETCO2 and breath sounds checked- equal and bilateral Secured at: 21 cm Tube secured with: Tape Dental Injury: Teeth and Oropharynx as per pre-operative assessment

## 2021-01-22 NOTE — Interval H&P Note (Signed)
History and Physical Interval Note:  01/22/2021 6:59 AM  Jacqueline Jones  has presented today for surgery, with the diagnosis of CHOLELITHIASIS.  The various methods of treatment have been discussed with the patient and family. After consideration of risks, benefits and other options for treatment, the patient has consented to  Procedure(s): LAPAROSCOPIC CHOLECYSTECTOMY WITH INTRAOPERATIVE CHOLANGIOGRAM (N/A) as a surgical intervention.  The patient's history has been reviewed, patient examined, no change in status, stable for surgery.  I have reviewed the patient's chart and labs.  Questions were answered to the patient's satisfaction.     Wynona Luna

## 2021-01-22 NOTE — Anesthesia Preprocedure Evaluation (Signed)
Anesthesia Evaluation  Patient identified by MRN, date of birth, ID band Patient awake    Reviewed: Allergy & Precautions, NPO status , Patient's Chart, lab work & pertinent test results  Airway Mallampati: II  TM Distance: >3 FB Neck ROM: Full    Dental  (+) Dental Advisory Given, Missing, Chipped, Poor Dentition,    Pulmonary neg shortness of breath, asthma , sleep apnea , neg COPD, neg recent URI, Current Smoker and Patient abstained from smoking.,  Covid-19 Nucleic Acid Test Results Lab Results      Component                Value               Date                      SARSCOV2NAA              NEGATIVE            01/15/2021                SARSCOV2NAA              NEGATIVE            03/22/2020                SARSCOV2NAA              NEGATIVE            11/03/2019             No CPAP ordered   breath sounds clear to auscultation       Cardiovascular hypertension, (-) angina(-) Past MI and (-) CHF  Rhythm:Regular     Neuro/Psych  Headaches, Seizures -,  PSYCHIATRIC DISORDERS Anxiety Depression Bipolar Disorder 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)    GI/Hepatic Neg liver ROS, GERD  Medicated and Controlled,gallstones   Endo/Other  negative endocrine ROS  Renal/GU negative Renal ROS     Musculoskeletal negative musculoskeletal ROS (+)   Abdominal   Peds  Hematology negative hematology ROS (+) Lab Results      Component                Value               Date                      WBC                      6.1                 01/17/2021                HGB                      12.6                01/17/2021                HCT                      40.0                01/17/2021                MCV  87.9                01/17/2021                PLT                      259                 01/17/2021              Anesthesia Other Findings   Reproductive/Obstetrics Lab  Results      Component                Value               Date                      PREGTESTUR               NEGATIVE            01/22/2021                PREGSERUM                NEG                 02/05/2014                HCG                      <5.0                03/22/2020                                        Anesthesia Physical Anesthesia Plan  ASA: 3  Anesthesia Plan: General   Post-op Pain Management:    Induction: Intravenous  PONV Risk Score and Plan: 2 and Ondansetron and Dexamethasone  Airway Management Planned: Oral ETT  Additional Equipment: None  Intra-op Plan:   Post-operative Plan: Extubation in OR  Informed Consent: I have reviewed the patients History and Physical, chart, labs and discussed the procedure including the risks, benefits and alternatives for the proposed anesthesia with the patient or authorized representative who has indicated his/her understanding and acceptance.     Dental advisory given  Plan Discussed with: CRNA and Surgeon  Anesthesia Plan Comments:         Anesthesia Quick Evaluation

## 2021-01-23 ENCOUNTER — Encounter (HOSPITAL_COMMUNITY): Payer: Self-pay | Admitting: Surgery

## 2021-01-23 LAB — SURGICAL PATHOLOGY

## 2021-01-24 NOTE — Anesthesia Postprocedure Evaluation (Signed)
Anesthesia Post Note  Patient: Jacqueline Jones  Procedure(s) Performed: LAPAROSCOPIC CHOLECYSTECTOMY WITH INTRAOPERATIVE CHOLANGIOGRAM     Patient location during evaluation: PACU Anesthesia Type: General Level of consciousness: awake and alert Pain management: pain level controlled Vital Signs Assessment: post-procedure vital signs reviewed and stable Respiratory status: spontaneous breathing, nonlabored ventilation, respiratory function stable and patient connected to nasal cannula oxygen Cardiovascular status: blood pressure returned to baseline and stable Postop Assessment: no apparent nausea or vomiting Anesthetic complications: no   No notable events documented.  Last Vitals:  Vitals:   01/22/21 1145 01/22/21 1200  BP: (!) 131/95 (!) 141/95  Pulse: (!) 108 (!) 106  Resp: 19 18  Temp:  (!) 36.3 C  SpO2: 92% 94%    Last Pain:  Vitals:   01/22/21 1200  TempSrc:   PainSc: 5                  Artez Regis COKER

## 2021-01-27 ENCOUNTER — Ambulatory Visit: Admit: 2021-01-27 | Disposition: A | Discharge status: Home / Self Care | Payer: Medicaid Other

## 2021-03-11 ENCOUNTER — Encounter: Payer: Self-pay | Admitting: Women's Health

## 2021-03-11 ENCOUNTER — Ambulatory Visit (INDEPENDENT_AMBULATORY_CARE_PROVIDER_SITE_OTHER): Payer: Medicaid Other | Admitting: Women's Health

## 2021-03-11 ENCOUNTER — Other Ambulatory Visit: Payer: Self-pay

## 2021-03-11 VITALS — BP 126/87 | HR 77 | Ht 60.0 in | Wt 190.0 lb

## 2021-03-11 DIAGNOSIS — R32 Unspecified urinary incontinence: Secondary | ICD-10-CM | POA: Diagnosis not present

## 2021-03-11 DIAGNOSIS — R102 Pelvic and perineal pain: Secondary | ICD-10-CM | POA: Diagnosis not present

## 2021-03-11 DIAGNOSIS — G8929 Other chronic pain: Secondary | ICD-10-CM

## 2021-03-11 DIAGNOSIS — Z7251 High risk heterosexual behavior: Secondary | ICD-10-CM

## 2021-03-11 MED ORDER — ELLA 30 MG PO TABS: 1.0000 | ORAL_TABLET | Freq: Once | ORAL | 1 refills | Status: AC

## 2021-03-11 NOTE — Progress Notes (Signed)
History:  Jacqueline Jones is a 30 y.o. G0P0 who presents to clinic today for contraceptive evaluation. Patient reports LMP 02/23/2021 with unprotected intercourse last night. Patient does not want to be pregnant at this time. Patient denies any additional intercourse since LMP.  Patient also reports long-standing history of chronic pelvic pain that is unrelieved by previous use of OCP and Depo. Patient reports even though the Depo did not help her pelvic pain, she did like it as a birth control method and wishes to restart. Patient reports she has had exploratory surgery for endometriosis in the past, but nothing was discovered. Patient reports the only thing that relieves her pelvic pain is Baclofen 10mg  TID taken only during menses. Patient requesting a refill of Baclofen today as she was previously getting it from her PCP, but was sent here  30yo, BMI 37.11 +smoker PMH: seizures, anxiety, depression, asthma, GERD PSH: cholescystectomy, exploratory surgery for emdometriosis Medication: pantoprazole (GERD), Keppra (seizures), Lamictal (seizures), Seroquel (anxiety and depression), albuterol (asthma), Baclofen (pelvic pain during menses)  Patient also reports at the end of the visit that she is experiencing urinary incontinence. Will refer to UroGYN for evaluation.  The following portions of the patient's history were reviewed and updated as appropriate: allergies, current medications, family history, past medical history, social history, past surgical history and problem list.  Review of Systems:  Review of Systems  Genitourinary:        Chronic pelvic pain Urinary incontinence  All other systems reviewed and are negative.    Objective:  Physical Exam BP 126/87   Pulse 77   Ht 5' (1.524 m)   Wt 190 lb (86.2 kg)   LMP 02/23/2021   BMI 37.11 kg/m   Physical Exam Vitals reviewed.  Constitutional:      General: She is not in acute distress.    Appearance: Normal appearance.  She is not ill-appearing, toxic-appearing or diaphoretic.  HENT:     Head: Normocephalic and atraumatic.  Pulmonary:     Effort: Pulmonary effort is normal.  Neurological:     Mental Status: She is alert and oriented to person, place, and time.  Psychiatric:        Mood and Affect: Mood normal.        Behavior: Behavior normal.        Thought Content: Thought content normal.        Judgment: Judgment normal.   Labs and Imaging No results found for this or any previous visit (from the past 24 hour(s)).  No results found.  Health Maintenance Due  Topic Date Due   COVID-19 Vaccine (1) Never done   Pneumococcal Vaccine 78-61 Years old (1 - PCV) Never done   PAP SMEAR-Modifier  Never done   INFLUENZA VACCINE  02/23/2021     Assessment & Plan:  1. Unprotected sexual intercourse - ulipristal acetate (ELLA) 30 MG tablet; Take 1 tablet (30 mg total) by mouth once for 1 dose.  Dispense: 1 tablet; Refill: 1 -pt to call when getting her next period to start Depo -pt advised next period will be altered and possibly heavier and later than expected -pt advised not to have unprotected intercourse after taking 04/25/2021 until starting Depo  2. Chronic pelvic pain in female -referred to [facility] Pelvic Pain office for further evaluation [facility] -referral to pelvic PT - baclofen (LIORESAL) 10 MG tablet; Take 1 tablet (10 mg total) by mouth 3 (three) times daily for 10 days.  Dispense: 30 tablet; Refill:  0 -single refill of Baclofen given for next menses, pt referred to [facility] Pelvic Pain office for further evaluation  3. Urinary incontinence, unspecified type - Ambulatory referral to Urogynecology  Approximately 15 minutes of total time was spent with this patient on counseling and coordination of care.  Marylen Ponto, NP 03/11/2021 1:17 PM

## 2021-03-12 MED ORDER — BACLOFEN 10 MG PO TABS: 10.0000 mg | ORAL_TABLET | Freq: Three times a day (TID) | ORAL | 0 refills | Status: AC

## 2021-03-23 ENCOUNTER — Telehealth: Payer: Medicaid Other | Admitting: Physician Assistant

## 2021-03-23 DIAGNOSIS — M545 Low back pain, unspecified: Secondary | ICD-10-CM | POA: Diagnosis not present

## 2021-03-23 DIAGNOSIS — H9202 Otalgia, left ear: Secondary | ICD-10-CM | POA: Diagnosis not present

## 2021-03-24 MED ORDER — NEOMYCIN-POLYMYXIN-HC 3.5-10000-1 OT SOLN: 3.0000 [drp] | Freq: Four times a day (QID) | OTIC | 0 refills | Status: DC

## 2021-03-24 MED ORDER — BACLOFEN 10 MG PO TABS: 10.0000 mg | ORAL_TABLET | Freq: Three times a day (TID) | ORAL | 0 refills | Status: DC

## 2021-03-24 NOTE — Addendum Note (Signed)
Addended by: Waldon Merl on: 03/24/2021 08:40 AM   Modules accepted: Orders

## 2021-03-24 NOTE — Progress Notes (Signed)
We are sorry that you are not feeling well.  Here is how we plan to help!  Based on what you have shared with me it looks like you mostly have acute back pain.  Acute back pain is defined as musculoskeletal pain that can resolve in 1-3 weeks with conservative treatment.  I have prescribed Baclofen 10 mg every eight hours as needed which is a muscle relaxer  S Please keep in mind that muscle relaxer's can cause fatigue and should not be taken while at work or driving.  Back pain is very common.  The pain often gets better over time.  The cause of back pain is usually not dangerous.  Most people can learn to manage their back pain on their own.  I noted your mention of incontinence but looking at chart review I see this has been ongoing and is being properly worked up and not related to current back issue. However, if you note anything new or worsening about this while you are having back pain, you need an in person evaluation.   I also see you mentioned some ear pain and redness. Can you please tell me a bit more about that? When did it start? Any other symptoms like drainage, change in hearing, ringing in the ears, nasal congestion or other URI symptoms? Any fever with this? Any symptoms with the other ear? Any further information you can give me is appreciated. I will await your response.    Home Care Stay active.  Start with short walks on flat ground if you can.  Try to walk farther each day. Do not sit, drive or stand in one place for more than 30 minutes.  Do not stay in bed. Do not avoid exercise or work.  Activity can help your back heal faster. Be careful when you bend or lift an object.  Bend at your knees, keep the object close to you, and do not twist. Sleep on a firm mattress.  Lie on your side, and bend your knees.  If you lie on your back, put a pillow under your knees. Only take medicines as told by your doctor. Put ice on the injured area. Put ice in a plastic bag Place a towel  between your skin and the bag Leave the ice on for 15-20 minutes, 3-4 times a day for the first 2-3 days. 210 After that, you can switch between ice and heat packs. Ask your doctor about back exercises or massage. Avoid feeling anxious or stressed.  Find good ways to deal with stress, such as exercise.  Get Help Right Way If: Your pain does not go away with rest or medicine. Your pain does not go away in 1 week. You have new problems. You do not feel well. The pain spreads into your legs. You cannot control when you poop (bowel movement) or pee (urinate) You feel sick to your stomach (nauseous) or throw up (vomit) You have belly (abdominal) pain. You feel like you may pass out (faint). If you develop a fever.  Make Sure you: Understand these instructions. Will watch your condition Will get help right away if you are not doing well or get worse.  Your e-visit answers were reviewed by a board certified advanced clinical practitioner to complete your personal care plan.  Depending on the condition, your plan could have included both over the counter or prescription medications.  If there is a problem please reply  once you have received a response from your provider.  Your safety is important to [place].  If you have drug allergies check your prescription carefully.    You can use MyChart to ask questions about today's visit, request a non-urgent call back, or ask for a work or school excuse for 24 hours related to this e-Visit. If it has been greater than 24 hours you will need to follow up with your provider, or enter a new e-Visit to address those concerns.  You will get an e-mail in the next two days asking about your experience.  I hope that your e-visit has been valuable and will speed your recovery. Thank you for using e-visits.

## 2021-03-24 NOTE — Progress Notes (Signed)
I have spent 5 minutes in review of e-visit questionnaire, review and updating patient chart, medical decision making and response to patient.   Terryl Niziolek Cody Joelys Staubs, PA-C    

## 2021-03-25 ENCOUNTER — Ambulatory Visit: Payer: Medicaid Other | Admitting: Internal Medicine

## 2021-04-17 ENCOUNTER — Telehealth: Payer: Medicaid Other | Admitting: Physician Assistant

## 2021-04-17 DIAGNOSIS — J069 Acute upper respiratory infection, unspecified: Secondary | ICD-10-CM

## 2021-04-17 MED ORDER — FLUTICASONE PROPIONATE 50 MCG/ACT NA SUSP: 2.0000 | Freq: Every day | NASAL | 0 refills | Status: DC

## 2021-04-17 MED ORDER — GUAIFENESIN-DM 100-10 MG/5ML PO SYRP: 5.0000 mL | ORAL_SOLUTION | ORAL | 0 refills | Status: DC | PRN

## 2021-04-17 MED ORDER — ALBUTEROL SULFATE HFA 108 (90 BASE) MCG/ACT IN AERS: 1.0000 | INHALATION_SPRAY | Freq: Three times a day (TID) | RESPIRATORY_TRACT | 0 refills | Status: DC | PRN

## 2021-04-17 NOTE — Progress Notes (Signed)
E-Visit for Upper Respiratory Infection   We are sorry you are not feeling well.  Here is how we plan to help!  Based on what you have shared with me, it looks like you may have a viral upper respiratory infection.  Upper respiratory infections are caused by a large number of viruses; however, rhinovirus is the most common cause.   Symptoms vary from person to person, with common symptoms including sore throat, cough, fatigue or lack of energy and feeling of general discomfort.  A low-grade fever of up to 100.4 may present, but is often uncommon.  Symptoms vary however, and are closely related to a person's age or underlying illnesses.  The most common symptoms associated with an upper respiratory infection are nasal discharge or congestion, cough, sneezing, headache and pressure in the ears and face.  These symptoms usually persist for about 3 to 10 days, but can last up to 2 weeks.  It is important to know that upper respiratory infections do not cause serious illness or complications in most cases.    Upper respiratory infections can be transmitted from person to person, with the most common method of transmission being a person's hands.  The virus is able to live on the skin and can infect other persons for up to 2 hours after direct contact.  Also, these can be transmitted when someone coughs or sneezes; thus, it is important to cover the mouth to reduce this risk.  To keep the spread of the illness at bay, good hand hygiene is very important.  This is an infection that is most likely caused by a virus. There are no specific treatments other than to help you with the symptoms until the infection runs its course.  We are sorry you are not feeling well.  Here is how we plan to help!   For nasal congestion, you may use an oral decongestants such as Mucinex D or if you have glaucoma or high blood pressure use plain Mucinex.  Saline nasal spray or nasal drops can help and can safely be used as often as  needed for congestion.  For your congestion, I have prescribed Fluticasone nasal spray one spray in each nostril twice a day  If you do not have a history of heart disease, hypertension, diabetes or thyroid disease, prostate/bladder issues or glaucoma, you may also use Sudafed to treat nasal congestion.  It is highly recommended that you consult with a pharmacist or your primary care physician to ensure this medication is safe for you to take.     If you have a cough, you may use cough suppressants such as Delsym and Robitussin.  If you have glaucoma or high blood pressure, you can also use Coricidin HBP.   For cough I have prescribed for you A prescription cough medication called Tessalon Perles 100 mg. You may take 1-2 capsules every 8 hours as needed for cough  If you have a sore or scratchy throat, use a saltwater gargle-  to  teaspoon of salt dissolved in a 4-ounce to 8-ounce glass of warm water.  Gargle the solution for approximately 15-30 seconds and then spit.  It is important not to swallow the solution.  You can also use throat lozenges/cough drops and Chloraseptic spray to help with throat pain or discomfort.  Warm or cold liquids can also be helpful in relieving throat pain.  For headache, pain or general discomfort, you can use Ibuprofen or Tylenol as directed.   Some authorities believe   that zinc sprays or the use of Echinacea may shorten the course of your symptoms.   HOME CARE Only take medications as instructed by your medical team. Be sure to drink plenty of fluids. Water is fine as well as fruit juices, sodas and electrolyte beverages. You may want to stay away from caffeine or alcohol. If you are nauseated, try taking small sips of liquids. How do you know if you are getting enough fluid? Your urine should be a pale yellow or almost colorless. Get rest. Taking a steamy shower or using a humidifier may help nasal congestion and ease sore throat pain. You can place a towel over  your head and breathe in the steam from hot water coming from a faucet. Using a saline nasal spray works much the same way. Cough drops, hard candies and sore throat lozenges may ease your cough. Avoid close contacts especially the very young and the elderly Cover your mouth if you cough or sneeze Always remember to wash your hands.   GET HELP RIGHT AWAY IF: You develop worsening fever. If your symptoms do not improve within 10 days You develop yellow or green discharge from your nose over 3 days. You have coughing fits You develop a severe head ache or visual changes. You develop shortness of breath, difficulty breathing or start having chest pain Your symptoms persist after you have completed your treatment plan  MAKE SURE YOU  Understand these instructions. Will watch your condition. Will get help right away if you are not doing well or get worse.  Thank you for choosing an e-visit.  Your e-visit answers were reviewed by a board certified advanced clinical practitioner to complete your personal care plan. Depending upon the condition, your plan could have included both over the counter or prescription medications.  Please review your pharmacy choice. Make sure the pharmacy is open so you can pick up prescription now. If there is a problem, you may contact your provider through MyChart messaging and have the prescription routed to another pharmacy.  Your safety is important to us. If you have drug allergies check your prescription carefully.   For the next 24 hours you can use MyChart to ask questions about today's visit, request a non-urgent call back, or ask for a work or school excuse. You will get an email in the next two days asking about your experience. I hope that your e-visit has been valuable and will speed your recovery.   I provided 6 minutes of non face-to-face time during this encounter for chart review and documentation.   

## 2021-04-27 ENCOUNTER — Ambulatory Visit: Payer: Medicaid Other

## 2021-05-01 ENCOUNTER — Telehealth: Payer: Medicaid Other | Admitting: Physician Assistant

## 2021-05-01 DIAGNOSIS — J069 Acute upper respiratory infection, unspecified: Secondary | ICD-10-CM

## 2021-05-01 MED ORDER — IPRATROPIUM BROMIDE 0.03 % NA SOLN: 2.0000 | Freq: Two times a day (BID) | NASAL | 12 refills | Status: DC

## 2021-05-01 MED ORDER — PSEUDOEPH-BROMPHEN-DM 30-2-10 MG/5ML PO SYRP: 5.0000 mL | ORAL_SOLUTION | Freq: Four times a day (QID) | ORAL | 0 refills | Status: DC | PRN

## 2021-05-01 NOTE — Progress Notes (Signed)

## 2021-05-02 ENCOUNTER — Telehealth: Payer: Medicaid Other | Admitting: Nurse Practitioner

## 2021-05-02 DIAGNOSIS — K0889 Other specified disorders of teeth and supporting structures: Secondary | ICD-10-CM

## 2021-05-02 MED ORDER — MELOXICAM 7.5 MG PO TABS: 7.5000 mg | ORAL_TABLET | Freq: Two times a day (BID) | ORAL | 0 refills | Status: AC | PRN

## 2021-05-02 NOTE — Progress Notes (Signed)
Based on what you shared with me, I feel your condition warrants further evaluation and I recommend that you be seen for a face to face visit with a dentist or your PCP. Unfortunately we do not prescribe controlled substances.  Please contact your primary care physician practice to be seen. Many offices offer virtual options to be seen via video if you are not comfortable going in person to a medical facility at this time.  NOTE: You will NOT be charged for this eVisit.  If you do not have a PCP, [facility] offers a free physician referral service available at [phone]. Our trained staff has the experience, knowledge and resources to put you in touch with a physician who is right for you.    If you are having a true medical emergency please call 911.   Your e-visit answers were reviewed by a board certified advanced clinical practitioner to complete your personal care plan.  Thank you for using e-Visits.

## 2021-05-02 NOTE — Addendum Note (Signed)
Addended by: Bertram Denver on: 05/02/2021 08:03 AM   Modules accepted: Orders, Level of Service

## 2021-05-02 NOTE — Progress Notes (Signed)
I have spent 5 minutes in review of e-visit questionnaire, review and updating patient chart, medical decision making and response to patient.  ° °Arena Lindahl W Letoya Stallone, NP ° °  °

## 2021-05-02 NOTE — Progress Notes (Signed)

## 2021-05-04 ENCOUNTER — Ambulatory Visit (INDEPENDENT_AMBULATORY_CARE_PROVIDER_SITE_OTHER): Payer: Medicaid Other

## 2021-05-04 ENCOUNTER — Other Ambulatory Visit: Payer: Self-pay

## 2021-05-04 VITALS — Wt 189.0 lb

## 2021-05-04 DIAGNOSIS — Z3042 Encounter for surveillance of injectable contraceptive: Secondary | ICD-10-CM | POA: Diagnosis not present

## 2021-05-04 LAB — POCT URINE PREGNANCY: Preg Test, Ur: NEGATIVE

## 2021-05-04 NOTE — Progress Notes (Signed)
Agree with nurses's documentation of this patient's clinic encounter.  Arlyss Weathersby L, MD  

## 2021-05-04 NOTE — Progress Notes (Signed)
Pt is in the office for 1st UPT for depo restart. UPT is negative today, pt will return in 2 weeks for 2nd test and injection. Pt states that she 'always' has unprotected intercourse, advised pt to avoid for 2 weeks so that she can return and start depo, pt agreed.

## 2021-05-06 ENCOUNTER — Encounter (HOSPITAL_COMMUNITY): Payer: Self-pay | Admitting: Emergency Medicine

## 2021-05-06 ENCOUNTER — Other Ambulatory Visit: Payer: Self-pay

## 2021-05-06 ENCOUNTER — Emergency Department (HOSPITAL_COMMUNITY)
Admission: EM | Admit: 2021-05-06 | Discharge: 2021-05-06 | Disposition: A | Discharge status: Home / Self Care | Payer: Medicaid Other | Attending: Emergency Medicine | Admitting: Emergency Medicine

## 2021-05-06 DIAGNOSIS — F17210 Nicotine dependence, cigarettes, uncomplicated: Secondary | ICD-10-CM | POA: Insufficient documentation

## 2021-05-06 DIAGNOSIS — Z91040 Latex allergy status: Secondary | ICD-10-CM | POA: Diagnosis not present

## 2021-05-06 DIAGNOSIS — K0889 Other specified disorders of teeth and supporting structures: Secondary | ICD-10-CM | POA: Diagnosis present

## 2021-05-06 DIAGNOSIS — J45909 Unspecified asthma, uncomplicated: Secondary | ICD-10-CM | POA: Insufficient documentation

## 2021-05-06 DIAGNOSIS — K029 Dental caries, unspecified: Secondary | ICD-10-CM

## 2021-05-06 MED ORDER — CLINDAMYCIN HCL 150 MG PO CAPS: 300.0000 mg | ORAL_CAPSULE | Freq: Three times a day (TID) | ORAL | 0 refills | Status: AC

## 2021-05-06 MED ORDER — ETODOLAC 300 MG PO CAPS: 300.0000 mg | ORAL_CAPSULE | Freq: Three times a day (TID) | ORAL | 0 refills | Status: DC

## 2021-05-06 NOTE — ED Triage Notes (Signed)
Patient states she has dental pain due to poor oral care-states she is on a waiting list to see dentist-pain, symptoms for 3 months

## 2021-05-06 NOTE — Discharge Instructions (Addendum)
Take the medications as prescribed.  Call the dentists office to schedule an appointment to help with your dental pain

## 2021-05-06 NOTE — ED Provider Notes (Signed)
[facility]-EMERGENCY DEPT Provider Note   CSN: [id number] Arrival date & time: 05/06/21  1533     History Chief Complaint  Patient presents with   Dental Pain    Jacqueline Jones is a 30 y.o. female.   Dental Pain  Patient presented to the ER for evaluation of dental pain.  Patient states that she has had this problem for several months.  She has been trying to get to see a dentist but is on a waiting list.  Patient states the pain is severe now and making it difficult for her to sleep.  No fevers or chills.  No facial swelling.  No difficulty swallowing.  Past Medical History:  Diagnosis Date   Anemia    Anxiety    Anxiety    Asthma    Bipolar disorder (HCC)    Complication of anesthesia    Pt states she has had seizures during and after surgery   Depression    Depression    Elevated cholesterol    Enlarged liver    Frequent headaches    GERD (gastroesophageal reflux disease)    High blood pressure    IBS (irritable bowel syndrome)    Infection    UTI   Obesity    Pneumonia    Seizures (HCC)    unknown cause- started 08/14  "monthly"   Sepsis (HCC)    Sleep apnea    No Cpap   UTI (lower urinary tract infection)     Patient Active Problem List   Diagnosis Date Noted   Cholelithiasis 01/16/2021   Gallstones 01/15/2021   Epigastric pain    Elevated LFTs    Hypokalemia    Non-intractable vomiting    QT prolongation    Overdose of sedative or hypnotic 11/06/2019   Major depressive disorder, recurrent episode, severe (HCC) 11/04/2019   Seizure (HCC) 11/03/2019   Medical non-compliance 05/13/2014   Neurologic abnormality 05/13/2014   Folliculitis 05/13/2014   UTI (lower urinary tract infection) 02/18/2014   Pupil asymmetry 02/05/2014   Polysubstance dependence (HCC) 02/28/2012   GAD (generalized anxiety disorder) 09/02/2011    Past Surgical History:  Procedure Laterality Date   CHOLECYSTECTOMY N/A 01/22/2021   Procedure:  LAPAROSCOPIC CHOLECYSTECTOMY WITH INTRAOPERATIVE CHOLANGIOGRAM;  Surgeon: Manus Rudd, MD;  Location: [facility] OR;  Service: General;  Laterality: N/A;   COLONOSCOPY     age [age]   DIAGNOSTIC LAPAROSCOPY       OB History     Gravida  0   Para  0   Term      Preterm      AB      Living         SAB      IAB      Ectopic      Multiple      Live Births              Family History  Problem Relation Age of Onset   Drug abuse Father    Cancer Father        age orange   Lung cancer Father    Depression Mother    Anxiety disorder Mother    Drug abuse Brother        incarcirated    Colon cancer Neg Hx     Social History   Tobacco Use   Smoking status: Every Day    Packs/day: 0.25    Years: 10.00    Pack  years: 2.50    Types: Cigarettes, Cigars   Smokeless tobacco: Former   Tobacco comments:    she is wanting to quit trying --  pt states smokes 3 cigs/day  Vaping Use   Vaping Use: Never used  Substance Use Topics   Alcohol use: Not Currently   Drug use: Not Currently    Frequency: 7.0 times per week    Home Medications Prior to Admission medications   Medication Sig Start Date End Date Taking? Authorizing Provider  albuterol (VENTOLIN HFA) 108 (90 Base) MCG/ACT inhaler Inhale 1 puff into the lungs every 8 (eight) hours as needed for wheezing or shortness of breath. 04/17/21   Margaretann Loveless, PA-C  baclofen (LIORESAL) 10 MG tablet Take 1 tablet (10 mg total) by mouth 3 (three) times daily. 03/24/21   Waldon Merl, PA-C  brompheniramine-pseudoephedrine-DM 30-2-10 MG/5ML syrup Take 5 mLs by mouth 4 (four) times daily as needed. 05/01/21   Margaretann Loveless, PA-C  clindamycin (CLEOCIN) 150 MG capsule Take 2 capsules (300 mg total) by mouth 3 (three) times daily for 7 days. 05/06/21 05/13/21 Yes Linwood Dibbles, MD  etodolac (LODINE) 300 MG capsule Take 1 capsule (300 mg total) by mouth every 8 (eight) hours. 05/06/21  Yes Linwood Dibbles, MD   fluticasone [facility]) 50 MCG/ACT nasal spray Place 2 sprays into both nostrils daily. 04/17/21   Margaretann Loveless, PA-C  ipratropium (ATROVENT) 0.03 % nasal spray Place 2 sprays into both nostrils every 12 (twelve) hours. 05/01/21   Margaretann Loveless, PA-C  lamoTRIgine (LAMICTAL) 200 MG tablet Take 200 mg by mouth 2 (two) times daily. 10/29/20   [provider]  levETIRAcetam (KEPPRA) 1000 MG tablet Take 1,000-1,500 mg by mouth See admin instructions. Take 1,000mg  at AM and 1,500mg  at [facility] 12/03/20   [provider]  meloxicam (MOBIC) 7.5 MG tablet Take 1 tablet (7.5 mg total) by mouth 2 (two) times daily as needed for up to 7 days for pain. 05/02/21 05/09/21  Claiborne Rigg, NP  neomycin-polymyxin-hydrocortisone (CORTISPORIN) OTIC solution Place 3 drops into the left ear 4 (four) times daily. 03/24/21   Waldon Merl, PA-C  pantoprazole (PROTONIX) 40 MG tablet Take 1 tablet (40 mg total) by mouth daily. 01/03/20   Meredith Pel, NP  prochlorperazine (COMPAZINE) 5 MG tablet Take 1 tablet (5 mg total) by mouth every 6 (six) hours as needed for nausea or vomiting. 01/22/21   Manus Rudd, MD    Allergies    Dilaudid [hydromorphone], Toradol [ketorolac tromethamine], Clonazepam, Flexeril [cyclobenzaprine], Gabapentin, Tramadol, Vicodin [hydrocodone-acetaminophen], Adhesive [tape], Azithromycin, Benzonatate, Keflet [cephalexin], Latex, and Levaquin [levofloxacin in d5w]  Review of Systems   Review of Systems  All other systems reviewed and are negative.  Physical Exam Updated Vital Signs LMP 04/20/2021   Physical Exam Vitals and nursing note reviewed.  Constitutional:      General: She is not in acute distress.    Appearance: She is well-developed.  HENT:     Head: Normocephalic and atraumatic.     Right Ear: External ear normal.     Left Ear: External ear normal.     Mouth/Throat:     Comments: Multiple dental caries noted, no oropharyngeal swelling, no facial  swelling, no discharge Eyes:     General: No scleral icterus.       Right eye: No discharge.        Left eye: No discharge.     Conjunctiva/sclera: Conjunctivae normal.  Neck:  Trachea: No tracheal deviation.  Cardiovascular:     Rate and Rhythm: Normal rate.  Pulmonary:     Effort: Pulmonary effort is normal. No respiratory distress.     Breath sounds: No stridor.  Abdominal:     General: There is no distension.  Musculoskeletal:        General: No swelling or deformity.     Cervical back: Neck supple.  Skin:    General: Skin is warm and dry.     Findings: No rash.  Neurological:     Mental Status: She is alert.     Cranial Nerves: Cranial nerve deficit: no gross deficits.    ED Results / Procedures / Treatments     ED Course  I have reviewed the triage vital signs and the nursing notes.  Pertinent labs & imaging results that were available during my care of the patient were reviewed by me and considered in my medical decision making (see chart for details).    MDM Rules/Calculators/A&P                           Patient has evidence of dental caries.  No signs of abscess or severe infection.  Will prescribe antibiotics and anti-inflammatory pain medications.  Refer to dentistry Final Clinical Impression(s) / ED Diagnoses Final diagnoses:  Dental caries    Rx / DC Orders ED Discharge Orders          Ordered    etodolac (LODINE) 300 MG capsule  Every 8 hours       Note to Pharmacy: As needed for pain   05/06/21 1613    clindamycin (CLEOCIN) 150 MG capsule  3 times daily        05/06/21 1613             Linwood Dibbles, MD 05/06/21 [phone]

## 2021-05-18 ENCOUNTER — Ambulatory Visit: Payer: Medicaid Other

## 2021-05-21 NOTE — Progress Notes (Deleted)
Urogynecology New Patient Evaluation and Consultation  Referring Provider: Marylen Ponto, NP PCP: Norm Salt, PA Date of Service: 05/22/2021  SUBJECTIVE Chief Complaint: No chief complaint on file.  History of Present Illness: Jacqueline Jones is a 30 y.o. White or Caucasian female seen in consultation at the request of NP Joni Reining Nugent for evaluation of chronic pelvic pain.    Review of records from NP Nugent significant for: Pain unresponsive to OCPs and Depo. She has had laparoscopy for endometriosis but did not find anything.  Also reports urinary incontinence. Referred to pelvic floor PT  Urinary Symptoms: {urine leakage?:24754} Leaks *** time(s) per {days/wks/mos/yrs:310907}.  Pad use: {NUMBERS 1-10:18281} {pad option:24752} per day.   She {ACTION; IS/IS GGY:69485462} bothered by her UI symptoms.  Day time voids ***.  Nocturia: *** times per night to void. Voiding dysfunction: she {empties:24755} her bladder well.  {DOES NOT does:27190::"does not"} use a catheter to empty bladder.  When urinating, she feels {urine symptoms:24756} Drinks: *** per day  UTIs: {NUMBERS 1-10:18281} UTI's in the last year.   {ACTIONS;DENIES/REPORTS:21021675::"Denies"} history of {urologic concerns:24757}  Pelvic Organ Prolapse Symptoms:                  She {denies/ admits to:24761} a feeling of a bulge the vaginal area. It has been present for {NUMBER 1-10:22536} {days/wks/mos/yrs:310907}.  She {denies/ admits to:24761} seeing a bulge.  This bulge {ACTION; IS/IS VOJ:50093818} bothersome.  Bowel Symptom: Bowel movements: *** time(s) per {Time; day/week/month:13537} Stool consistency: {stool consistency:24758} Straining: {yes/no:19897}.  Splinting: {yes/no:19897}.  Incomplete evacuation: {yes/no:19897}.  She {denies/ admits to:24761} accidental bowel leakage / fecal incontinence  Occurs: *** time(s) per {Time; day/week/month:13537}  Consistency with leakage: {stool  consistency:24758} Bowel regimen: {bowel regimen:24759} Last colonoscopy: Date ***, Results ***  Sexual Function Sexually active: {yes/no:19897}.  Sexual orientation: {Sexual [phone]} Pain with sex: {pain with sex:24762}  Pelvic Pain {denies/ admits to:24761} pelvic pain Location: *** Pain occurs: *** Prior pain treatment: *** Improved by: *** Worsened by: ***   Past Medical History:  Past Medical History:  Diagnosis Date   Anemia    Anxiety    Anxiety    Asthma    Bipolar disorder (HCC)    Complication of anesthesia    Pt states she has had seizures during and after surgery   Depression    Depression    Elevated cholesterol    Enlarged liver    Frequent headaches    GERD (gastroesophageal reflux disease)    High blood pressure    IBS (irritable bowel syndrome)    Infection    UTI   Obesity    Pneumonia    Seizures (HCC)    unknown cause- started 08/14  "monthly"   Sepsis (HCC)    Sleep apnea    No Cpap   UTI (lower urinary tract infection)      Past Surgical History:   Past Surgical History:  Procedure Laterality Date   CHOLECYSTECTOMY N/A 01/22/2021   Procedure: LAPAROSCOPIC CHOLECYSTECTOMY WITH INTRAOPERATIVE CHOLANGIOGRAM;  Surgeon: Manus Rudd, MD;  Location: [facility] OR;  Service: General;  Laterality: N/A;   COLONOSCOPY     age [age]   DIAGNOSTIC LAPAROSCOPY       Past OB/GYN History: G{NUMBERS 1-10:18281} P{NUMBERS 1-10:18281} Vaginal deliveries: ***,  Forceps/ Vacuum deliveries: ***, Cesarean section: *** Menopausal: {menopausal:24763} Contraception: ***. Last pap smear was ***.  Any history of abnormal pap smears: {yes/no:19897}.   Medications: She has a current medication list  which includes the following prescription(s): albuterol, baclofen, brompheniramine-pseudoephedrine-dm, etodolac, fluticasone, ipratropium, lamotrigine, levetiracetam, neomycin-polymyxin-hydrocortisone, pantoprazole, and prochlorperazine.    Allergies: Patient is allergic to dilaudid [hydromorphone], toradol [ketorolac tromethamine], clonazepam, flexeril [cyclobenzaprine], gabapentin, tramadol, vicodin [hydrocodone-acetaminophen], adhesive [tape], azithromycin, benzonatate, keflet [cephalexin], latex, and levaquin [levofloxacin in d5w].   Social History:  Social History   Tobacco Use   Smoking status: Every Day    Packs/day: 0.25    Years: 10.00    Pack years: 2.50    Types: Cigarettes, Cigars   Smokeless tobacco: Former   Tobacco comments:    she is wanting to quit trying --  pt states smokes 3 cigs/day  Vaping Use   Vaping Use: Never used  Substance Use Topics   Alcohol use: Not Currently   Drug use: Not Currently    Frequency: 7.0 times per week    Relationship status: {relationship status:24764} She lives with ***.   She {ACTION; IS/IS JKK:93818299} employed ***. Regular exercise: {Yes/No:304960894} History of abuse: {Yes/No:304960894}  Family History:   Family History  Problem Relation Age of Onset   Drug abuse Father    Cancer Father        age orange   Lung cancer Father    Depression Mother    Anxiety disorder Mother    Drug abuse Brother        incarcirated    Colon cancer Neg Hx      Review of Systems: ROS   OBJECTIVE Physical Exam: There were no vitals filed for this visit.  Physical Exam   GU / Detailed Urogynecologic Evaluation:  Pelvic Exam: Normal external female genitalia; Bartholin's and Skene's glands normal in appearance; urethral meatus normal in appearance, no urethral masses or discharge.   CST: {gen negative/positive:315881}  Reflexes: bulbocavernosis {DESC; PRESENT/NOT PRESENT:21021351}, anocutaneous {DESC; PRESENT/NOT PRESENT:21021351} ***bilaterally.  Speculum exam reveals normal vaginal mucosa {With/Without:20273} atrophy. Cervix {exam; gyn cervix:30847}. Uterus {exam; pelvic uterus:30849}. Adnexa {exam; adnexa:12223}.    s/p hysterectomy: Speculum exam reveals  normal vaginal mucosa {With/Without:20273}  atrophy and normal vaginal cuff.  Adnexa {exam; adnexa:12223}.    With apex supported, anterior compartment defect was {reduced:24765}  Pelvic floor strength {Roman # I-V:19040}/V, puborectalis {Roman # I-V:19040}/V external anal sphincter {Roman # I-V:19040}/V  Pelvic floor musculature: Right levator {Tender/Non-tender:20250}, Right obturator {Tender/Non-tender:20250}, Left levator {Tender/Non-tender:20250}, Left obturator {Tender/Non-tender:20250}  POP-Q:   POP-Q                                               Aa                                               Ba                                                 C                                                Gh  Pb                                               tvl                                                Ap                                               Bp                                                 D     Rectal Exam:  Normal sphincter tone, {rectocele:24766} distal rectocele, enterocoele {DESC; PRESENT/NOT PRESENT:21021351}, no rectal masses, {sign of:24767} dyssynergia when asking the patient to bear down.  Post-Void Residual (PVR) by Bladder Scan: In order to evaluate bladder emptying, we discussed obtaining a postvoid residual and she agreed to this procedure.  Procedure: The ultrasound unit was placed on the patient's abdomen in the suprapubic region after the patient had voided. A PVR of *** ml was obtained by bladder scan.  Laboratory Results: @ENCLABS @   ***I visualized the urine specimen, noting the specimen to be {urine color:24768}  ASSESSMENT AND PLAN Ms. Lorah is a 30 y.o. with: No diagnosis found.    26, MD   Medical Decision Making:  - Reviewed/ ordered a clinical laboratory test - Reviewed/ ordered a radiologic study - Reviewed/ ordered medicine test - Decision to obtain old  records - Discussion of management of or test interpretation with an external physician / other healthcare professional  - Assessment requiring independent historian - Review and summation of prior records - Independent review of image, tracing or specimen

## 2021-05-22 ENCOUNTER — Ambulatory Visit: Payer: Medicaid Other | Admitting: Obstetrics and Gynecology

## 2021-05-22 ENCOUNTER — Telehealth: Payer: Medicaid Other | Admitting: Emergency Medicine

## 2021-05-22 DIAGNOSIS — J209 Acute bronchitis, unspecified: Secondary | ICD-10-CM | POA: Diagnosis not present

## 2021-05-22 MED ORDER — DOXYCYCLINE HYCLATE 100 MG PO TABS: 100.0000 mg | ORAL_TABLET | Freq: Two times a day (BID) | ORAL | 0 refills | Status: DC

## 2021-05-22 MED ORDER — PSEUDOEPH-BROMPHEN-DM 30-2-10 MG/5ML PO SYRP: 5.0000 mL | ORAL_SOLUTION | Freq: Four times a day (QID) | ORAL | 0 refills | Status: DC | PRN

## 2021-05-22 NOTE — Progress Notes (Signed)
We are sorry that you are not feeling well.  Here is how we plan to help!  Based on your presentation I believe you most likely have A cough due to bacteria.  When patients have a fever and a productive cough with a change in color or increased sputum production, we are concerned about bacterial bronchitis.  If left untreated it can progress to pneumonia.  If your symptoms do not improve with your treatment plan it is important that you contact your provider.   I have prescribed Levofloxacin 500 mg daily for 7 days    In addition you may use brompheniramine-pseudoephedrine DM for cough.    From your responses in the eVisit questionnaire you describe inflammation in the upper respiratory tract which is causing a significant cough.  This is commonly called Bronchitis and has four common causes:   Allergies Viral Infections Acid Reflux Bacterial Infection Allergies, viruses and acid reflux are treated by controlling symptoms or eliminating the cause. An example might be a cough caused by taking certain blood pressure medications. You stop the cough by changing the medication. Another example might be a cough caused by acid reflux. Controlling the reflux helps control the cough.  USE OF BRONCHODILATOR ("RESCUE") INHALERS: There is a risk from using your bronchodilator too frequently.  The risk is that over-reliance on a medication which only relaxes the muscles surrounding the breathing tubes can reduce the effectiveness of medications prescribed to reduce swelling and congestion of the tubes themselves.  Although you feel brief relief from the bronchodilator inhaler, your asthma may actually be worsening with the tubes becoming more swollen and filled with mucus.  This can delay other crucial treatments, such as oral steroid medications. If you need to use a bronchodilator inhaler daily, several times per day, you should discuss this with your provider.  There are probably better treatments that could be  used to keep your asthma under control.     HOME CARE Only take medications as instructed by your medical team. Complete the entire course of an antibiotic. Drink plenty of fluids and get plenty of rest. Avoid close contacts especially the very young and the elderly Cover your mouth if you cough or cough into your sleeve. Always remember to wash your hands A steam or ultrasonic humidifier can help congestion.   GET HELP RIGHT AWAY IF: You develop worsening fever. You become short of breath You cough up blood. Your symptoms persist after you have completed your treatment plan MAKE SURE YOU  Understand these instructions. Will watch your condition. Will get help right away if you are not doing well or get worse.    Thank you for choosing an e-visit.  Your e-visit answers were reviewed by a board certified advanced clinical practitioner to complete your personal care plan. Depending upon the condition, your plan could have included both over the counter or prescription medications.  Please review your pharmacy choice. Make sure the pharmacy is open so you can pick up prescription now. If there is a problem, you may contact your provider through [employer] and have the prescription routed to another pharmacy.  Your safety is important to [place]. If you have drug allergies check your prescription carefully.   For the next 24 hours you can use MyChart to ask questions about today's visit, request a non-urgent call back, or ask for a work or school excuse. You will get an email in the next two days asking about your experience. I hope that your  e-visit has been valuable and will speed your recovery.  I have spent 5 minutes in review of e-visit questionnaire, review and updating patient chart, medical decision making and response to patient.   Rica Mast, PhD, FNP-BC

## 2021-05-25 ENCOUNTER — Other Ambulatory Visit: Payer: Self-pay | Admitting: Physician Assistant

## 2021-05-28 ENCOUNTER — Ambulatory Visit: Payer: Medicaid Other

## 2021-06-08 ENCOUNTER — Ambulatory Visit (INDEPENDENT_AMBULATORY_CARE_PROVIDER_SITE_OTHER): Payer: Medicaid Other

## 2021-06-08 ENCOUNTER — Other Ambulatory Visit: Payer: Self-pay

## 2021-06-08 DIAGNOSIS — Z3042 Encounter for surveillance of injectable contraceptive: Secondary | ICD-10-CM

## 2021-06-08 LAB — POCT URINE PREGNANCY: Preg Test, Ur: NEGATIVE

## 2021-06-08 NOTE — Progress Notes (Signed)
Patient was assessed and managed by nursing staff during this encounter. I have reviewed the chart and agree with the documentation and plan. I have also made any necessary editorial changes.  Conny Situ A Shreyansh Tiffany, MD 06/08/2021 5:04 PM   

## 2021-06-08 NOTE — Progress Notes (Signed)
Patient presents for first upt for depo restart. Pregnancy test today is negative. Patient will need to return in 2 weeks for another upt and depo inj to be given with a negative 2nd upt and with no unprotected intercourse during that time.  Patient is in need of an AEX. Patient to schedule AEX.

## 2021-06-23 ENCOUNTER — Ambulatory Visit: Payer: Medicaid Other | Admitting: Family Medicine

## 2021-07-28 ENCOUNTER — Ambulatory Visit: Payer: Medicaid Other | Admitting: Nurse Practitioner

## 2021-07-28 ENCOUNTER — Ambulatory Visit: Payer: Medicaid Other | Admitting: Obstetrics and Gynecology

## 2021-08-26 ENCOUNTER — Ambulatory Visit: Payer: Medicaid Other | Admitting: Obstetrics

## 2021-09-01 ENCOUNTER — Telehealth: Payer: Medicaid Other | Admitting: Family Medicine

## 2021-09-01 DIAGNOSIS — M546 Pain in thoracic spine: Secondary | ICD-10-CM

## 2021-09-01 MED ORDER — BACLOFEN 10 MG PO TABS: 10.0000 mg | ORAL_TABLET | Freq: Three times a day (TID) | ORAL | 0 refills | Status: DC

## 2021-09-01 NOTE — Progress Notes (Signed)
We are sorry that you are not feeling well.  Here is how we plan to help! ? ?Based on what you have shared with me it looks like you mostly have acute back pain. ? ?Acute back pain is defined as musculoskeletal pain that can resolve in 1-3 weeks with conservative treatment. ? ?I have prescribed  Baclofen 10 mg every eight hours as needed which is a muscle relaxer  Some patients experience stomach irritation or in increased heartburn with anti-inflammatory drugs.  Please keep in mind that muscle relaxer's can cause fatigue and should not be taken while at work or driving.  Back pain is very common.  The pain often gets better over time.  The cause of back pain is usually not dangerous.  Most people can learn to manage their back pain on their own. ? ?Home Care ?Stay active.  Start with short walks on flat ground if you can.  Try to walk farther each day. ?Do not sit, drive or stand in one place for more than 30 minutes.  Do not stay in bed. ?Do not avoid exercise or work.  Activity can help your back heal faster. ?Be careful when you bend or lift an object.  Bend at your knees, keep the object close to you, and do not twist. ?Sleep on a firm mattress.  Lie on your side, and bend your knees.  If you lie on your back, put a pillow under your knees. ?Only take medicines as told by your doctor. ?Put ice on the injured area. ?Put ice in a plastic bag ?Place a towel between your skin and the bag ?Leave the ice on for 15-20 minutes, 3-4 times a day for the first 2-3 days. 210 After that, you can switch between ice and heat packs. ?Ask your doctor about back exercises or massage. ?Avoid feeling anxious or stressed.  Find good ways to deal with stress, such as exercise. ? ?Get Help Right Way If: ?Your pain does not go away with rest or medicine. ?Your pain does not go away in 1 week. ?You have new problems. ?You do not feel well. ?The pain spreads into your legs. ?You cannot control when you poop (bowel movement) or pee  (urinate) ?You feel sick to your stomach (nauseous) or throw up (vomit) ?You have belly (abdominal) pain. ?You feel like you may pass out (faint). ?If you develop a fever. ? ?Make Sure you: ?Understand these instructions. ?Will watch your condition ?Will get help right away if you are not doing well or get worse. ? ?Your e-visit answers were reviewed by a board certified advanced clinical practitioner to complete your personal care plan.  Depending on the condition, your plan could have included both over the counter or prescription medications. ? ?If there is a problem please reply  once you have received a response from your provider. ? ?Your safety is important to us.  If you have drug allergies check your prescription carefully.   ? ?You can use MyChart to ask questions about today?s visit, request a non-urgent call back, or ask for a work or school excuse for 24 hours related to this e-Visit. If it has been greater than 24 hours you will need to follow up with your provider, or enter a new e-Visit to address those concerns. ? ?You will get an e-mail in the next two days asking about your experience.  I hope that your e-visit has been valuable and will speed your recovery. Thank you for using e-visits. ? ? ?  I provided 5 minutes of non face-to-face time during this encounter for chart review, medication and order placement, as well as and documentation.  ? ?

## 2021-09-16 ENCOUNTER — Ambulatory Visit: Payer: Medicaid Other | Admitting: Obstetrics and Gynecology

## 2021-09-22 ENCOUNTER — Telehealth: Payer: Medicaid Other | Admitting: Physician Assistant

## 2021-09-22 DIAGNOSIS — K2900 Acute gastritis without bleeding: Secondary | ICD-10-CM

## 2021-09-22 NOTE — Progress Notes (Signed)
Based on what you shared with me, I feel your condition warrants further evaluation and I recommend that you be seen in a face to face visit.  I agree this sounds like flare of your heartburn/GERD but giving pain in chest and back there is increased concern for a full-blown gastritis or ulcer -- as such we need you to be evaluated in person so you can get a detailed exam and workup to rule out more serious consequences of the heart burn and to make sure nothing is missed and proper treatment given.    NOTE: There will be NO CHARGE for this eVisit   If you are having a true medical emergency please call 911.      For an urgent face to face visit, [place] has six urgent care centers for your convenience:     [facility] Health Urgent Care Center at [facility] Directions [phone] [address] [place], [place] [address]    [facility] Center [facility]) Get Driving Directions [phone] [address] [place], [place] [address]  [facility] Health Urgent Care Center [facility]) Get Driving Directions [phone] [address] [place],  [place]  [address]  [facility]gent Care at [facility] Get Driving Directions [phone] [address] [place], [place] [address]   [facility] Health Urgent Care at [facility] Get Driving Directions  [phone] [address] [place], [place] [address]   [facility] Health Urgent Care at [facility] Directions [phone] [address] [place], [place] [address]  Your MyChart E-visit questionnaire answers were reviewed by a board certified advanced clinical practitioner to complete your personal care plan based on your specific symptoms.  Thank you for using e-Visits.

## 2021-10-07 ENCOUNTER — Ambulatory Visit: Payer: Medicaid Other | Admitting: Obstetrics

## 2021-12-21 ENCOUNTER — Telehealth: Payer: Medicaid Other | Admitting: Physician Assistant

## 2021-12-21 DIAGNOSIS — R197 Diarrhea, unspecified: Secondary | ICD-10-CM | POA: Diagnosis not present

## 2021-12-21 NOTE — Progress Notes (Signed)
We are sorry that you are not feeling well.  Here is how we plan to help!  Based on what you have shared with me it looks like you have Acute Infectious Diarrhea.  Most cases of acute diarrhea are due to infections with virus and bacteria and are self-limited conditions lasting less than 14 days.  For your symptoms you may take Imodium 2 mg tablets that are over the counter at your local pharmacy. Take two tablet now and then one after each loose stool up to 6 a day.  Antibiotics are not needed for most people with diarrhea.    HOME CARE We recommend changing your diet to help with your symptoms for the next few days. Drink plenty of fluids that contain water salt and sugar. Sports drinks such as Gatorade may help.  You may try broths, soups, bananas, applesauce, soft breads, mashed potatoes or crackers.  You are considered infectious for as long as the diarrhea continues. Hand washing or use of alcohol based hand sanitizers is recommend. It is best to stay out of work or school until your symptoms stop.   GET HELP RIGHT AWAY If you have dark yellow colored urine or do not pass urine frequently you should drink more fluids.   If your symptoms worsen  If you feel like you are going to pass out (faint) You have a new problem  MAKE SURE YOU  Understand these instructions. Will watch your condition. Will get help right away if you are not doing well or get worse.  Thank you for choosing an e-visit.  Your e-visit answers were reviewed by a board certified advanced clinical practitioner to complete your personal care plan. Depending upon the condition, your plan could have included both over the counter or prescription medications.  Please review your pharmacy choice. Make sure the pharmacy is open so you can pick up prescription now. If there is a problem, you may contact your provider through MyChart messaging and have the prescription routed to another pharmacy.  Your safety is  important to us. If you have drug allergies check your prescription carefully.   For the next 24 hours you can use MyChart to ask questions about today's visit, request a non-urgent call back, or ask for a work or school excuse. You will get an email in the next two days asking about your experience. I hope that your e-visit has been valuable and will speed your recovery.  Approximately 5 minutes was spent documenting and reviewing patient's chart. '  

## 2021-12-22 ENCOUNTER — Telehealth: Payer: Medicaid Other | Admitting: Physician Assistant

## 2021-12-22 DIAGNOSIS — M549 Dorsalgia, unspecified: Secondary | ICD-10-CM

## 2021-12-22 NOTE — Progress Notes (Signed)
Based on what you shared with me, I feel your condition warrants further evaluation and I recommend that you be seen in a face to face visit.  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 Tizanidine can also prolong the QT interval and so it is not safe to use them together due to risk of arrhythmia. I recommend you speak to your PCP if they have given you this before as they can prescribe if they are comfortable with it. Otherwise, I would recommend being see in person so that EKG can be checked to see if tizanidine is an acceptable choice.    NOTE: There will be NO CHARGE for this eVisit   If you are having a true medical emergency please call 911.      For an urgent face to face visit, [place] has six urgent care centers for your convenience:     [place] [facility] [place] [facility] [place] Get Driving Directions [id number] [place], [place] [address]    [place] [facility]) Get Driving Directions [id number] [place], [place] [address]  [place] [facility] [place] [facility] Get Driving Directions [id number] [address] [place],  [place]  [address]  [place] [facility] Get Driving Directions [id number] [address] [place], [place], [place] [address]   [place] [facility] Get Driving Directions  [id number] [address] [place],  [address]   [place] [facility] [place] Get Driving Directions [id number] [address] [place],  [address]  Your MyChart E-visit questionnaire answers were reviewed by a board certified advanced clinical practitioner to complete your personal care plan based on your specific symptoms.  Thank you for using e-Visits.

## 2021-12-24 ENCOUNTER — Telehealth: Payer: Medicaid Other | Admitting: Physician Assistant

## 2021-12-24 DIAGNOSIS — M549 Dorsalgia, unspecified: Secondary | ICD-10-CM

## 2021-12-24 NOTE — Progress Notes (Signed)
Because of your visit yesterday when you noted ongoing symptoms for a several week duration, you will still need to be evaluated in person or at least via a video visit (can schedule through your MyChart) so you can get proper evaluation and management.    NOTE: There will be NO CHARGE for this eVisit   If you are having a true medical emergency please call 911.      For an urgent face to face visit,  has six urgent care centers for your convenience:     [facility] Health Urgent Care Center at [facility] Directions [phone] [address] [place], [place] [address]    [facility] Health Urgent Care Center [facility]) Get Driving Directions [phone] [address] [place], [place] [address]  [facility] Health Urgent Care Center [facility]) Get Driving Directions [phone] [address] [place],  [place]  [address]  [facility] Health Urgent Care at [facility] Get Driving Directions [phone] [address] [place], [place] [address]   [facility] Health Urgent Care at [facility] Get Driving Directions  [phone] [address] [place], [place] [address]   [facility] Health Urgent Care at [facility] Directions [phone] [address] [place], [place] [address]  Your MyChart E-visit questionnaire answers were reviewed by a board certified advanced clinical practitioner to complete your personal care plan based on your specific symptoms.  Thank you for using e-Visits.

## 2022-01-08 ENCOUNTER — Telehealth: Payer: Medicaid Other | Admitting: Physician Assistant

## 2022-01-08 DIAGNOSIS — M62830 Muscle spasm of back: Secondary | ICD-10-CM | POA: Diagnosis not present

## 2022-01-08 MED ORDER — BACLOFEN 10 MG PO TABS: 10.0000 mg | ORAL_TABLET | Freq: Three times a day (TID) | ORAL | 0 refills | Status: DC

## 2022-01-08 NOTE — Progress Notes (Signed)

## 2022-01-15 ENCOUNTER — Emergency Department (HOSPITAL_COMMUNITY): Payer: Medicaid Other

## 2022-01-15 ENCOUNTER — Other Ambulatory Visit: Payer: Self-pay

## 2022-01-15 ENCOUNTER — Encounter (HOSPITAL_COMMUNITY): Payer: Self-pay

## 2022-01-15 ENCOUNTER — Emergency Department (HOSPITAL_COMMUNITY)
Admission: EM | Admit: 2022-01-15 | Discharge: 2022-01-15 | Disposition: A | Discharge status: Home / Self Care | Payer: Medicaid Other | Attending: Emergency Medicine | Admitting: Emergency Medicine

## 2022-01-15 DIAGNOSIS — Y999 Unspecified external cause status: Secondary | ICD-10-CM | POA: Diagnosis not present

## 2022-01-15 DIAGNOSIS — W010XXA Fall on same level from slipping, tripping and stumbling without subsequent striking against object, initial encounter: Secondary | ICD-10-CM | POA: Insufficient documentation

## 2022-01-15 DIAGNOSIS — Y9289 Other specified places as the place of occurrence of the external cause: Secondary | ICD-10-CM | POA: Diagnosis not present

## 2022-01-15 DIAGNOSIS — Z91040 Latex allergy status: Secondary | ICD-10-CM | POA: Diagnosis not present

## 2022-01-15 DIAGNOSIS — S300XXA Contusion of lower back and pelvis, initial encounter: Secondary | ICD-10-CM | POA: Diagnosis not present

## 2022-01-15 DIAGNOSIS — R937 Abnormal findings on diagnostic imaging of other parts of musculoskeletal system: Secondary | ICD-10-CM | POA: Diagnosis not present

## 2022-01-15 DIAGNOSIS — Y9389 Activity, other specified: Secondary | ICD-10-CM | POA: Diagnosis not present

## 2022-01-15 DIAGNOSIS — S3992XA Unspecified injury of lower back, initial encounter: Secondary | ICD-10-CM | POA: Diagnosis present

## 2022-01-15 DIAGNOSIS — M546 Pain in thoracic spine: Secondary | ICD-10-CM | POA: Diagnosis not present

## 2022-01-15 DIAGNOSIS — W19XXXA Unspecified fall, initial encounter: Secondary | ICD-10-CM

## 2022-01-15 MED ORDER — OXYCODONE-ACETAMINOPHEN 5-325 MG PO TABS
2.0000 | ORAL_TABLET | Freq: Once | ORAL | Status: AC
  Administered 2022-01-15: 2 via ORAL
  Filled 2022-01-15: qty 2

## 2022-01-15 MED ORDER — OXYCODONE-ACETAMINOPHEN 5-325 MG PO TABS: 1.0000 | ORAL_TABLET | ORAL | 0 refills | Status: DC | PRN

## 2022-01-16 ENCOUNTER — Other Ambulatory Visit: Payer: Self-pay | Admitting: Physician Assistant

## 2022-01-16 DIAGNOSIS — M62830 Muscle spasm of back: Secondary | ICD-10-CM

## 2022-01-19 ENCOUNTER — Telehealth: Payer: Medicaid Other | Admitting: Emergency Medicine

## 2022-01-19 DIAGNOSIS — M549 Dorsalgia, unspecified: Secondary | ICD-10-CM

## 2022-01-25 ENCOUNTER — Telehealth: Payer: Medicaid Other | Admitting: Urgent Care

## 2022-01-25 ENCOUNTER — Telehealth: Payer: Medicaid Other | Admitting: Physician Assistant

## 2022-01-25 DIAGNOSIS — M62830 Muscle spasm of back: Secondary | ICD-10-CM | POA: Diagnosis not present

## 2022-01-25 DIAGNOSIS — M549 Dorsalgia, unspecified: Secondary | ICD-10-CM

## 2022-01-25 MED ORDER — METAXALONE 800 MG PO TABS: 800.0000 mg | ORAL_TABLET | Freq: Three times a day (TID) | ORAL | 0 refills | Status: DC | PRN

## 2022-01-25 NOTE — Patient Instructions (Signed)
Jacqueline Jones, thank you for joining Jacqueline Bees, PA for today's virtual visit.  While this provider is not your primary care provider (PCP), if your PCP is located in our provider database this encounter information will be shared with them immediately following your visit.  Consent: (Patient) Jacqueline Jones provided verbal consent for this virtual visit at the beginning of the encounter.  Current Medications:  Current Outpatient Medications:    metaxalone (SKELAXIN) 800 MG tablet, Take 1 tablet (800 mg total) by mouth 3 (three) times daily as needed for muscle spasms., Disp: 30 tablet, Rfl: 0   albuterol (VENTOLIN HFA) 108 (90 Base) MCG/ACT inhaler, Inhale 1 puff into the lungs every 8 (eight) hours as needed for wheezing or shortness of breath., Disp: 8 g, Rfl: 0   baclofen (LIORESAL) 10 MG tablet, Take 1 tablet (10 mg total) by mouth 3 (three) times daily., Disp: 30 each, Rfl: 0   etodolac (LODINE) 300 MG capsule, Take 1 capsule (300 mg total) by mouth every 8 (eight) hours., Disp: 21 capsule, Rfl: 0   lamoTRIgine (LAMICTAL) 200 MG tablet, Take 200 mg by mouth 2 (two) times daily., Disp: , Rfl:    levETIRAcetam (KEPPRA) 1000 MG tablet, Take 1,000-1,500 mg by mouth See admin instructions. Take 1,000mg  at AM and 1,500mg  at PM, Disp: , Rfl:    oxyCODONE-acetaminophen (PERCOCET) 5-325 MG tablet, Take 1 tablet by mouth every 4 (four) hours as needed for severe pain., Disp: 10 tablet, Rfl: 0   pantoprazole (PROTONIX) 40 MG tablet, Take 1 tablet (40 mg total) by mouth daily., Disp: 30 tablet, Rfl: 3   Medications ordered in this encounter:  Meds ordered this encounter  Medications   metaxalone (SKELAXIN) 800 MG tablet    Sig: Take 1 tablet (800 mg total) by mouth 3 (three) times daily as needed for muscle spasms.    Dispense:  30 tablet    Refill:  0    Order Specific Question:   Supervising Provider    Answer:   Hyacinth Meeker, BRIAN [[id number]]     *If you need refills on other medications  prior to your next appointment, please contact your pharmacy*  Follow-Up: Call back or seek an in-person evaluation if the symptoms worsen or if the condition fails to improve as anticipated.  Other Instructions Your symptoms are consistent with thoracic muscle spasms. Please purchase a microwaveable heating pack and place on affected area 2-3 times daily. Start taking skelaxin up to three times daily as needed. Monitor side effects, which can include drowsiness or sedation. Please keep your follow up appointment with your PCP on 01/27/22. If you develop any new or worsening symptoms, particularly fever, shortness of breath, nerve pain in arms or legs, or loss of bowel or bladder function, head to ER for evalation.   If you have been instructed to have an in-person evaluation today at a local Urgent Care facility, please use the link below. It will take you to a list of all of our available [facility], including address, phone number and hours of operation. Please do not delay care.  [facility]  If you or a family member do not have a primary care provider, use the link below to schedule a visit and establish care. When you choose a [facility] primary care physician or advanced practice provider, you gain a long-term partner in health. Find a Primary Care Provider  Learn more about 's in-office and virtual care options:  [facility]

## 2022-01-25 NOTE — Progress Notes (Signed)
Virtual Visit Consent   KENNYA SCHWENN, you are scheduled for a virtual visit with a [facility] provider today. Just as with appointments in the office, your consent must be obtained to participate. Your consent will be active for this visit and any virtual visit you may have with one of our providers in the next 365 days. If you have a MyChart account, a copy of this consent can be sent to you electronically.  As this is a virtual visit, video technology does not allow for your provider to perform a traditional examination. This may limit your provider's ability to fully assess your condition. If your provider identifies any concerns that need to be evaluated in person or the need to arrange testing (such as labs, EKG, etc.), we will make arrangements to do so. Although advances in technology are sophisticated, we cannot ensure that it will always work on either your end or our end. If the connection with a video visit is poor, the visit may have to be switched to a telephone visit. With either a video or telephone visit, we are not always able to ensure that we have a secure connection.  By engaging in this virtual visit, you consent to the provision of healthcare and authorize for your insurance to be billed (if applicable) for the services provided during this visit. Depending on your insurance coverage, you may receive a charge related to this service.  I need to obtain your verbal consent now. Are you willing to proceed with your visit today? [name] has provided verbal consent on 01/25/2022 for a virtual visit (video or telephone). [name], PA  Date: 01/25/2022 12:23 PM  Virtual Visit via Video Note   I, [name] L [name], connected with  Jacqueline Jones  ([id number], 1991/01/18) on 01/25/22 at 12:00 PM EDT by a video-enabled telemedicine application and verified that I am speaking with the correct person using two identifiers.  Location: Patient: Virtual Visit Location  Patient: Home Provider: Virtual Visit Location Provider: Home Office   I discussed the limitations of evaluation and management by telemedicine and the availability of in person appointments. The patient expressed understanding and agreed to proceed.    History of Present Illness: Jacqueline Jones is a 31 y.o. who identifies as a female who was assigned female at birth, and is being seen today for back spasms.  HPI: Pleasant 31yo female presents today requesting a medication refill. Pt sustained a fall on 01/15/22 and went to ER. Had CT confirming mild T12 compression fracture, age indeterminate. Pt tells me this is choronic. Has hx of back issues and back spasms. She states that her current spasms and pain is located in her superior T spine near shoulder blades. Has used tizanidine in the past and feels this is the most effective, however has hx of QT prolongation and is currently taking Keppra. Was prescribed baclofen in the ER, but does not want to take it as she states it "makes me addicted." Pt has follow up with her PCP on July 5th. States current sx consistent with all prior muscle spasms. No cough, SOB, CP, radicular symptoms, saddle anesthesia, or change in bowel/bladder. Has been using icy hot without relief.    Problems:  Patient Active Problem List   Diagnosis Date Noted   Cholelithiasis 01/16/2021   Gallstones 01/15/2021   Epigastric pain    Elevated LFTs    Hypokalemia    Non-intractable vomiting    QT prolongation  Overdose of sedative or hypnotic 11/06/2019   Major depressive disorder, recurrent episode, severe (HCC) 11/04/2019   Seizure (HCC) 11/03/2019   Medical non-compliance 05/13/2014   Neurologic abnormality 05/13/2014   Folliculitis 05/13/2014   UTI (lower urinary tract infection) 02/18/2014   Pupil asymmetry 02/05/2014   Polysubstance dependence (HCC) 02/28/2012   GAD (generalized anxiety disorder) 09/02/2011    Allergies:  Allergies  Allergen Reactions    Dilaudid [Hydromorphone] Other (See Comments)    Had a seizure after receiving   Toradol [Ketorolac Tromethamine] Itching    seizure   Clonazepam Other (See Comments)    Seizure   Flexeril [Cyclobenzaprine] Other (See Comments)    seizure   Gabapentin Other (See Comments)    seizures    Tramadol Itching    Seizures   Vicodin [Hydrocodone-Acetaminophen] Itching    Pt states she can take Tylenol without difficulty.   Adhesive [Tape] Rash   Azithromycin Rash   Benzonatate Rash   Keflet [Cephalexin] Rash    Did it involve swelling of the face/tongue/throat, SOB, or low BP? No Did it involve sudden or severe rash/hives, skin peeling, or any reaction on the inside of your mouth or nose? No Did you need to seek medical attention at a hospital or doctor's office? No When did it last happen?       If all above answers are "NO", may proceed with cephalosporin use.    Latex Rash   Levaquin [Levofloxacin In D5w] Rash   Medications:  Current Outpatient Medications:    metaxalone (SKELAXIN) 800 MG tablet, Take 1 tablet (800 mg total) by mouth 3 (three) times daily as needed for muscle spasms., Disp: 30 tablet, Rfl: 0   albuterol (VENTOLIN HFA) 108 (90 Base) MCG/ACT inhaler, Inhale 1 puff into the lungs every 8 (eight) hours as needed for wheezing or shortness of breath., Disp: 8 g, Rfl: 0   baclofen (LIORESAL) 10 MG tablet, Take 1 tablet (10 mg total) by mouth 3 (three) times daily., Disp: 30 each, Rfl: 0   etodolac (LODINE) 300 MG capsule, Take 1 capsule (300 mg total) by mouth every 8 (eight) hours., Disp: 21 capsule, Rfl: 0   lamoTRIgine (LAMICTAL) 200 MG tablet, Take 200 mg by mouth 2 (two) times daily., Disp: , Rfl:    levETIRAcetam (KEPPRA) 1000 MG tablet, Take 1,000-1,500 mg by mouth See admin instructions. Take 1,000mg  at AM and 1,500mg  at PM, Disp: , Rfl:    oxyCODONE-acetaminophen (PERCOCET) 5-325 MG tablet, Take 1 tablet by mouth every 4 (four) hours as needed for severe pain., Disp:  10 tablet, Rfl: 0   pantoprazole (PROTONIX) 40 MG tablet, Take 1 tablet (40 mg total) by mouth daily., Disp: 30 tablet, Rfl: 3  Observations/Objective: Patient is well-developed, well-nourished in no acute distress.  Resting at home, NAD. Non-toxic  Head is normocephalic, atraumatic.  No labored breathing. No cough Speech is clear and coherent with logical content.  Patient is alert and oriented at baseline.    Assessment and Plan: 1. Back muscle spasm  Pt with long hx of chronic back issues for which muscle relaxers have usually worked. She is specifically requesting tizanidine, however this is contraindicated given her medication list and hx of long-QT syndrome. She denies red flag s/sx and states sx today consistent with all prior muscle spasms. Will switch to skelaxin. Pt has f/u with PCP in 2 days. Red flag s/sx reviewed which would warrant ER follow up.  Follow Up Instructions: I discussed the assessment and  treatment plan with the patient. The patient was provided an opportunity to ask questions and all were answered. The patient agreed with the plan and demonstrated an understanding of the instructions.  A copy of instructions were sent to the patient via MyChart unless otherwise noted below.   The patient was advised to call back or seek an in-person evaluation if the symptoms worsen or if the condition fails to improve as anticipated.  Time:  I spent 10 minutes with the patient via telehealth technology discussing the above problems/concerns.    Lantz Hermann L Elisabetta Mishra, PA

## 2022-01-25 NOTE — Progress Notes (Signed)
   Thank you for the details you included in the comment boxes. Those details are very helpful in determining the best course of treatment for you and help us to provide the best care.Because of requesting a medication refill, we recommend that you convert this visit to a video visit in order for the provider to better assess what is going on.  The provider will be able to give you a more accurate diagnosis and treatment plan if we can more freely discuss your symptoms and with the addition of a virtual examination.   If you convert to a video visit, we will bill your insurance (similar to an office visit) and you will not be charged for this e-Visit. You will be able to stay at home and speak with the first available [facility] advanced practice provider. The link to do a video visit is in the drop down Menu tab of your Welcome screen in MyChart.  I provided 5 minutes of non face-to-face time during this encounter for chart review and documentation.   

## 2022-02-17 ENCOUNTER — Encounter (HOSPITAL_COMMUNITY): Payer: Self-pay | Admitting: *Deleted

## 2022-02-17 ENCOUNTER — Emergency Department (HOSPITAL_COMMUNITY): Payer: Medicaid Other

## 2022-02-17 ENCOUNTER — Emergency Department (HOSPITAL_COMMUNITY)
Admission: EM | Admit: 2022-02-17 | Discharge: 2022-02-17 | Disposition: A | Discharge status: Home / Self Care | Payer: Medicaid Other | Attending: Emergency Medicine | Admitting: Emergency Medicine

## 2022-02-17 ENCOUNTER — Other Ambulatory Visit: Payer: Self-pay

## 2022-02-17 DIAGNOSIS — G40909 Epilepsy, unspecified, not intractable, without status epilepticus: Secondary | ICD-10-CM | POA: Diagnosis not present

## 2022-02-17 DIAGNOSIS — Z91040 Latex allergy status: Secondary | ICD-10-CM | POA: Diagnosis not present

## 2022-02-17 DIAGNOSIS — N9489 Other specified conditions associated with female genital organs and menstrual cycle: Secondary | ICD-10-CM | POA: Diagnosis not present

## 2022-02-17 DIAGNOSIS — I10 Essential (primary) hypertension: Secondary | ICD-10-CM | POA: Diagnosis not present

## 2022-02-17 DIAGNOSIS — R569 Unspecified convulsions: Secondary | ICD-10-CM | POA: Diagnosis present

## 2022-02-17 DIAGNOSIS — J45909 Unspecified asthma, uncomplicated: Secondary | ICD-10-CM | POA: Diagnosis not present

## 2022-02-17 DIAGNOSIS — N390 Urinary tract infection, site not specified: Secondary | ICD-10-CM | POA: Insufficient documentation

## 2022-02-17 LAB — CBC WITH DIFFERENTIAL/PLATELET
Abs Immature Granulocytes: 0.14 10*3/uL — ABNORMAL HIGH (ref 0.00–0.07)
Basophils Absolute: 0.1 10*3/uL (ref 0.0–0.1)
Basophils Relative: 0 %
Eosinophils Absolute: 0.1 10*3/uL (ref 0.0–0.5)
Eosinophils Relative: 1 %
HCT: 46.4 % — ABNORMAL HIGH (ref 36.0–46.0)
Hemoglobin: 15 g/dL (ref 12.0–15.0)
Immature Granulocytes: 1 %
Lymphocytes Relative: 16 %
Lymphs Abs: 2 10*3/uL (ref 0.7–4.0)
MCH: 27.7 pg (ref 26.0–34.0)
MCHC: 32.3 g/dL (ref 30.0–36.0)
MCV: 85.6 fL (ref 80.0–100.0)
Monocytes Absolute: 0.8 10*3/uL (ref 0.1–1.0)
Monocytes Relative: 6 %
Neutro Abs: 9.9 10*3/uL — ABNORMAL HIGH (ref 1.7–7.7)
Neutrophils Relative %: 76 %
Platelets: 263 10*3/uL (ref 150–400)
RBC: 5.42 MIL/uL — ABNORMAL HIGH (ref 3.87–5.11)
RDW: 16.1 % — ABNORMAL HIGH (ref 11.5–15.5)
WBC: 13 10*3/uL — ABNORMAL HIGH (ref 4.0–10.5)
nRBC: 0 % (ref 0.0–0.2)

## 2022-02-17 LAB — URINALYSIS, ROUTINE W REFLEX MICROSCOPIC
Bilirubin Urine: NEGATIVE
Glucose, UA: NEGATIVE mg/dL
Ketones, ur: 5 mg/dL — AB
Nitrite: POSITIVE — AB
Protein, ur: 30 mg/dL — AB
Specific Gravity, Urine: 1.015 (ref 1.005–1.030)
pH: 5 (ref 5.0–8.0)

## 2022-02-17 LAB — RAPID URINE DRUG SCREEN, HOSP PERFORMED
Amphetamines: NOT DETECTED
Barbiturates: NOT DETECTED
Benzodiazepines: NOT DETECTED
Cocaine: NOT DETECTED
Opiates: NOT DETECTED
Tetrahydrocannabinol: NOT DETECTED

## 2022-02-17 LAB — COMPREHENSIVE METABOLIC PANEL
ALT: 32 U/L (ref 0–44)
AST: 46 U/L — ABNORMAL HIGH (ref 15–41)
Albumin: 4 g/dL (ref 3.5–5.0)
Alkaline Phosphatase: 133 U/L — ABNORMAL HIGH (ref 38–126)
Anion gap: 12 (ref 5–15)
BUN: 8 mg/dL (ref 6–20)
CO2: 20 mmol/L — ABNORMAL LOW (ref 22–32)
Calcium: 9.3 mg/dL (ref 8.9–10.3)
Chloride: 107 mmol/L (ref 98–111)
Creatinine, Ser: 0.78 mg/dL (ref 0.44–1.00)
GFR, Estimated: >60 mL/min (ref >60)
Glucose, Bld: 103 mg/dL — ABNORMAL HIGH (ref 70–99)
Potassium: 4.5 mmol/L (ref 3.5–5.1)
Sodium: 139 mmol/L (ref 135–145)
Total Bilirubin: 0.3 mg/dL (ref 0.3–1.2)
Total Protein: 7.3 g/dL (ref 6.5–8.1)

## 2022-02-17 LAB — I-STAT BETA HCG BLOOD, ED (MC, WL, AP ONLY): I-stat hCG, quantitative: <5 m[IU]/mL (ref <5)

## 2022-02-17 MED ORDER — SODIUM CHLORIDE 0.9 % IV SOLN
2000.0000 mg | Freq: Once | INTRAVENOUS | Status: AC
  Administered 2022-02-17: 2000 mg via INTRAVENOUS
  Filled 2022-02-17: qty 20

## 2022-02-17 MED ORDER — SULFAMETHOXAZOLE-TRIMETHOPRIM 800-160 MG PO TABS: 1.0000 | ORAL_TABLET | Freq: Two times a day (BID) | ORAL | 0 refills | Status: AC

## 2022-02-17 NOTE — ED Provider Notes (Signed)
[facility] EMERGENCY DEPARTMENT Provider Note   CSN: [id number] Arrival date & time: 02/17/22  1738     History {Add pertinent medical, surgical, social history, OB history to HPI:1} Chief Complaint  Patient presents with   Seizures    Jacqueline Jones is a 31 y.o. female.  HPI     Home Medications Prior to Admission medications   Medication Sig Start Date End Date Taking? Authorizing Provider  albuterol (VENTOLIN HFA) 108 (90 Base) MCG/ACT inhaler Inhale 1 puff into the lungs every 8 (eight) hours as needed for wheezing or shortness of breath. 04/17/21   Margaretann Loveless, PA-C  baclofen (LIORESAL) 10 MG tablet Take 1 tablet (10 mg total) by mouth 3 (three) times daily. 01/08/22   Margaretann Loveless, PA-C  etodolac (LODINE) 300 MG capsule Take 1 capsule (300 mg total) by mouth every 8 (eight) hours. 05/06/21   Linwood Dibbles, MD  lamoTRIgine (LAMICTAL) 200 MG tablet Take 200 mg by mouth 2 (two) times daily. 10/29/20   [provider]  levETIRAcetam (KEPPRA) 1000 MG tablet Take 1,000-1,500 mg by mouth See admin instructions. Take 1,000mg  at AM and 1,500mg  at [facility] 12/03/20   [provider]  metaxalone (SKELAXIN) 800 MG tablet Take 1 tablet (800 mg total) by mouth 3 (three) times daily as needed for muscle spasms. 01/25/22   Crain, Whitney L, PA  oxyCODONE-acetaminophen (PERCOCET) 5-325 MG tablet Take 1 tablet by mouth every 4 (four) hours as needed for severe pain. 01/15/22 01/15/23  Elson Areas, PA-C  pantoprazole (PROTONIX) 40 MG tablet Take 1 tablet (40 mg total) by mouth daily. 01/03/20   Meredith Pel, NP      Allergies    Dilaudid [hydromorphone], Toradol [ketorolac tromethamine], Clonazepam, Flexeril [cyclobenzaprine], Gabapentin, Tramadol, Vicodin [hydrocodone-acetaminophen], Adhesive [tape], Azithromycin, Benzonatate, Keflet [cephalexin], Latex, and Levaquin [levofloxacin in d5w]    Review of Systems   Review of Systems  Physical  Exam Updated Vital Signs BP 102/78   Pulse (!) 113   Temp (!) 97 F (36.1 C) (Temporal)   Resp (!) 21   Ht 5' (1.524 m)   Wt 85.7 kg   LMP 01/17/2022   SpO2 97%   BMI 36.90 kg/m  Physical Exam  ED Results / Procedures / Treatments   Labs (all labs ordered are listed, but only abnormal results are displayed) Labs Reviewed  CBC WITH DIFFERENTIAL/PLATELET - Abnormal; Notable for the following components:      Result Value   WBC 13.0 (*)    RBC 5.42 (*)    HCT 46.4 (*)    RDW 16.1 (*)    Neutro Abs 9.9 (*)    Abs Immature Granulocytes 0.14 (*)    All other components within normal limits  COMPREHENSIVE METABOLIC PANEL - Abnormal; Notable for the following components:   CO2 20 (*)    Glucose, Bld 103 (*)    AST 46 (*)    Alkaline Phosphatase 133 (*)    All other components within normal limits  URINALYSIS, ROUTINE W REFLEX MICROSCOPIC - Abnormal; Notable for the following components:   APPearance HAZY (*)    Hgb urine dipstick SMALL (*)    Ketones, ur 5 (*)    Protein, ur 30 (*)    Nitrite POSITIVE (*)    Leukocytes,Ua TRACE (*)    Bacteria, UA MANY (*)    All other components within normal limits  LAMOTRIGINE LEVEL  LEVETIRACETAM LEVEL  RAPID URINE DRUG SCREEN, HOSP  PERFORMED  I-STAT BETA HCG BLOOD, ED ([facility], [facility], [facility] ONLY)    EKG EKG Interpretation  Date/Time:  Wednesday February 17 2022 17:39:59 EDT Ventricular Rate:  126 PR Interval:  143 QRS Duration: 80 QT Interval:  316 QTC Calculation: 458 R Axis:   -15 Text Interpretation: Sinus tachycardia Probable left atrial enlargement Borderline left axis deviation Low voltage, precordial leads Since previous ECG< heart rate has increased Confirmed by Alvira Monday ([id number]) on 02/17/2022 8:39:03 PM  Radiology CT Head Wo Contrast  Result Date: 02/17/2022 CLINICAL DATA:  Seizure disorder, clinical change EXAM: CT HEAD WITHOUT CONTRAST TECHNIQUE: Contiguous axial images were obtained from the base of the skull through  the vertex without intravenous contrast. RADIATION DOSE REDUCTION: This exam was performed according to the departmental dose-optimization program which includes automated exposure control, adjustment of the mA and/or kV according to patient size and/or use of iterative reconstruction technique. COMPARISON:  CT head 11/21/2019 FINDINGS: Brain: No evidence of large-territorial acute infarction. No parenchymal hemorrhage. No mass lesion. No extra-axial collection. No mass effect or midline shift. No hydrocephalus. Basilar cisterns are patent. Vascular: No hyperdense vessel. Skull: No acute fracture or focal lesion. Sinuses/Orbits: Paranasal sinuses and mastoid air cells are clear. The orbits are unremarkable. Other: None. IMPRESSION: No acute intracranial abnormality. Electronically Signed   By: Tish Frederickson M.D.   On: 02/17/2022 20:19    Procedures Procedures  {Document cardiac monitor, telemetry assessment procedure when appropriate:1}  Medications Ordered in ED Medications  levETIRAcetam (KEPPRA) 2,000 mg in sodium chloride 0.9 % 250 mL IVPB (0 mg Intravenous Stopped 02/17/22 1845)    ED Course/ Medical Decision Making/ A&P                           Medical Decision Making Amount and/or Complexity of Data Reviewed Labs: ordered. Radiology: ordered.   ***  {Document critical care time when appropriate:1} {Document review of labs and clinical decision tools ie heart score, Chads2Vasc2 etc:1}  {Document your independent review of radiology images, and any outside records:1} {Document your discussion with family members, caretakers, and with consultants:1} {Document social determinants of health affecting pt's care:1} {Document your decision making why or why not admission, treatments were needed:1} Final Clinical Impression(s) / ED Diagnoses Final diagnoses:  None    Rx / DC Orders ED Discharge Orders     None

## 2022-02-17 NOTE — ED Triage Notes (Signed)
The pt arrived by gems from home follwing 2 seizures within a 2 hour period.  On arrival to the ed the pt can only give her name.  She has bruising to her tongue  all questions asked is followed by I dont know  family supposed to be coming  iv per ems

## 2022-02-17 NOTE — Discharge Instructions (Signed)
You may increase your keppra to 1500mg  BID (from 1000mg  in the AM and 1500mg  at night) for the next few days as you begin UTI treatment then resume normal dosing and discuss adjustments with your neurologist.

## 2022-02-18 ENCOUNTER — Telehealth: Payer: Medicaid Other | Admitting: Physician Assistant

## 2022-02-18 DIAGNOSIS — R3989 Other symptoms and signs involving the genitourinary system: Secondary | ICD-10-CM

## 2022-02-18 LAB — LEVETIRACETAM LEVEL: Levetiracetam Lvl: 2.1 ug/mL — ABNORMAL LOW (ref 10.0–40.0)

## 2022-02-18 LAB — LAMOTRIGINE LEVEL: Lamotrigine Lvl: 2.8 ug/mL (ref 2.0–20.0)

## 2022-02-19 NOTE — Progress Notes (Signed)
Because it appears you are currently on Bactrim for a UTI that was started on 02/17/22, I feel your condition warrants further evaluation and I recommend that you be seen in a face to face visit.   NOTE: There will be NO CHARGE for this eVisit   If you are having a true medical emergency please call 911.      For an urgent face to face visit,  has seven urgent care centers for your convenience:     [facility] Health Urgent Care Center at [facility] Directions [phone] [address] [place], [place] [address]    [facility] Health Urgent Care Center [facility]) Get Driving Directions [phone] [address] [place], [place] [address]  [facility] Health Urgent Care Center [facility]) Get Driving Directions [phone] [address] [place],  [place]  [address]  [facility] Health Urgent Care Center [facility] at [employer] Directions  [phone] [address] [place],  [place] [address]   [facility] Health Urgent Care at [facility] Get Driving Directions [phone] [address] [place], [place] [address]   [facility] Health Urgent Care at [facility] Get Driving Directions  [phone] [address] [place], [place] [address]   [facility] Health Urgent Care at [facility] Directions [phone] [address], [address] [place], [place] [address]  Your MyChart E-visit questionnaire answers were reviewed by a board certified advanced clinical practitioner to complete your personal care plan based on your specific symptoms.  Thank you for using e-Visits.   I provided 5 minutes of non face-to-face time during this encounter for chart review and documentation.

## 2022-03-02 ENCOUNTER — Telehealth: Payer: Medicaid Other | Admitting: Physician Assistant

## 2022-03-02 DIAGNOSIS — M549 Dorsalgia, unspecified: Secondary | ICD-10-CM

## 2022-03-02 NOTE — Progress Notes (Signed)
Because of recurring back issues and request for tizanidine which can react poorly with your chronic medications (risks of QT prolongation and arrhythmias) I feel your condition warrants further evaluation and I recommend that you be seen in a face to face visit.   NOTE: There will be NO CHARGE for this eVisit   If you are having a true medical emergency please call 911.      For an urgent face to face visit, [facility] has seven urgent care centers for your convenience:     [facility] Health Urgent Care Center at [facility] Directions [phone] [address] [place], [place] [address]    [facility] Health Urgent Care Center [facility]) Get Driving Directions [phone] [address] [place], [place] [address]  [facility] Health Urgent Care Center [facility]) Get Driving Directions [phone] [address] [place],  [place]  [address]  [facility] Health Urgent Care Center [facility] - at [employer] Directions  [phone] [address] [place],  [place] [address]   [facility] Health Urgent Care at [facility] Get Driving Directions [phone] [address] [place], [place] [address]   [facility] Health Urgent Care at [facility] Get Driving Directions  [phone] [address] [place], [place] [address]   [facility] Health Urgent Care at [facility] Directions [phone] [address] [place], [place] [address]  Your MyChart E-visit questionnaire answers were reviewed by a board certified advanced clinical practitioner to complete your personal care plan based on your specific symptoms.  Thank you for using e-Visits.

## 2022-03-08 ENCOUNTER — Ambulatory Visit (HOSPITAL_COMMUNITY)
Admission: EM | Admit: 2022-03-08 | Discharge: 2022-03-09 | Disposition: A | Discharge status: Home / Self Care | Payer: Medicaid Other | Attending: Psychiatry | Admitting: Psychiatry

## 2022-03-08 DIAGNOSIS — Z20822 Contact with and (suspected) exposure to covid-19: Secondary | ICD-10-CM | POA: Insufficient documentation

## 2022-03-08 DIAGNOSIS — F69 Unspecified disorder of adult personality and behavior: Secondary | ICD-10-CM

## 2022-03-08 DIAGNOSIS — F319 Bipolar disorder, unspecified: Secondary | ICD-10-CM | POA: Insufficient documentation

## 2022-03-08 DIAGNOSIS — F4310 Post-traumatic stress disorder, unspecified: Secondary | ICD-10-CM | POA: Diagnosis not present

## 2022-03-08 DIAGNOSIS — F411 Generalized anxiety disorder: Secondary | ICD-10-CM

## 2022-03-08 DIAGNOSIS — Z91148 Patient's other noncompliance with medication regimen for other reason: Secondary | ICD-10-CM

## 2022-03-08 DIAGNOSIS — F17210 Nicotine dependence, cigarettes, uncomplicated: Secondary | ICD-10-CM | POA: Diagnosis not present

## 2022-03-08 LAB — CBC WITH DIFFERENTIAL/PLATELET
Abs Immature Granulocytes: 0.05 10*3/uL (ref 0.00–0.07)
Basophils Absolute: 0.1 10*3/uL (ref 0.0–0.1)
Basophils Relative: 1 %
Eosinophils Absolute: 0.1 10*3/uL (ref 0.0–0.5)
Eosinophils Relative: 1 %
HCT: 42.3 % (ref 36.0–46.0)
Hemoglobin: 14.2 g/dL (ref 12.0–15.0)
Immature Granulocytes: 1 %
Lymphocytes Relative: 45 %
Lymphs Abs: 4.1 10*3/uL — ABNORMAL HIGH (ref 0.7–4.0)
MCH: 28.1 pg (ref 26.0–34.0)
MCHC: 33.6 g/dL (ref 30.0–36.0)
MCV: 83.8 fL (ref 80.0–100.0)
Monocytes Absolute: 0.7 10*3/uL (ref 0.1–1.0)
Monocytes Relative: 7 %
Neutro Abs: 4.1 10*3/uL (ref 1.7–7.7)
Neutrophils Relative %: 45 %
Platelets: 278 10*3/uL (ref 150–400)
RBC: 5.05 MIL/uL (ref 3.87–5.11)
RDW: 15.9 % — ABNORMAL HIGH (ref 11.5–15.5)
WBC: 9.1 10*3/uL (ref 4.0–10.5)
nRBC: 0 % (ref 0.0–0.2)

## 2022-03-08 LAB — POC SARS CORONAVIRUS 2 AG: SARSCOV2ONAVIRUS 2 AG: NEGATIVE

## 2022-03-08 MED ORDER — LAMOTRIGINE 100 MG PO TABS
200.0000 mg | ORAL_TABLET | Freq: Two times a day (BID) | ORAL | Status: DC
  Administered 2022-03-08 – 2022-03-09 (×2): 200 mg via ORAL
  Filled 2022-03-08 (×2): qty 2

## 2022-03-08 MED ORDER — LEVETIRACETAM 500 MG PO TABS
1000.0000 mg | ORAL_TABLET | Freq: Every day | ORAL | Status: DC
  Administered 2022-03-09: 1000 mg via ORAL
  Filled 2022-03-08: qty 2

## 2022-03-08 MED ORDER — HYDROXYZINE HCL 25 MG PO TABS
25.0000 mg | ORAL_TABLET | Freq: Three times a day (TID) | ORAL | Status: DC | PRN
  Administered 2022-03-09: 25 mg via ORAL
  Filled 2022-03-08 (×2): qty 1

## 2022-03-08 MED ORDER — LEVETIRACETAM 500 MG PO TABS
1500.0000 mg | ORAL_TABLET | Freq: Every day | ORAL | Status: DC
Start: 2022-03-08 — End: 2022-03-09
  Administered 2022-03-08: 1500 mg via ORAL
  Filled 2022-03-08: qty 3

## 2022-03-08 MED ORDER — PANTOPRAZOLE SODIUM 40 MG PO TBEC
40.0000 mg | DELAYED_RELEASE_TABLET | Freq: Every day | ORAL | Status: DC
  Administered 2022-03-09: 40 mg via ORAL
  Filled 2022-03-08: qty 1

## 2022-03-08 MED ORDER — MAGNESIUM HYDROXIDE 400 MG/5ML PO SUSP: 30.0000 mL | Freq: Every day | ORAL | Status: DC | PRN

## 2022-03-08 MED ORDER — ALBUTEROL SULFATE HFA 108 (90 BASE) MCG/ACT IN AERS: 1.0000 | INHALATION_SPRAY | Freq: Three times a day (TID) | RESPIRATORY_TRACT | Status: DC | PRN

## 2022-03-08 MED ORDER — ALUM & MAG HYDROXIDE-SIMETH 200-200-20 MG/5ML PO SUSP: 30.0000 mL | ORAL | Status: DC | PRN

## 2022-03-08 NOTE — BH Assessment (Signed)
Comprehensive Clinical Assessment (CCA) Note  03/08/2022 [name] [id number]  DISPOSITION: Gave clinical report to Sindy Guadeloupe, NP who completed MSE and recommended Pt be admitted voluntarily for continuous assessment and re-evaluated by psychiatry in the morning.  Pt does not meet criteria for involuntary commitment and this TTS counselor completed First Examination for Involuntary Commitment releasing Pt from commitment. Paperwork is in [employer] in [employer] nurses station.   The patient demonstrates the following risk factors for suicide: Chronic risk factors for suicide include: psychiatric disorder of major depressive disorder . Acute risk factors for suicide include: N/A. Protective factors for this patient include: positive social support, positive therapeutic relationship, responsibility to others (children, family), hope for the future, and life satisfaction. Considering these factors, the overall suicide risk at this point appears to be low. Patient is appropriate for outpatient follow up.  Flowsheet Row ED from 03/08/2022 in [facility] ED from 02/17/2022 in [facility] EMERGENCY DEPARTMENT ED from 01/15/2022 in [facility] EMERGENCY DEPARTMENT  C-SSRS RISK CATEGORY No Risk No Risk No Risk      Pt is a 31 year old married female who presents unaccompanied to [facility] via Patent examiner after being petitioned for involuntary commitment by [employer] [phone]. Affidavit and petition states: "Respondent suffers from major depressive disorder. She is taking medications from her neighbor (oxycodone and Zanaflex) and stealing medication from her husband out of a lockbox. Respondent has admitted to taking the pills she is getting. Respondent has also admitted to drinking 3-4 Mad Dog 20/20's a day. Husband said respondent has overdosed in the past."  Pt says she has been experiencing severe anxiety. She says she has been  taking 9 tabs of her prescribed propanolol daily, not taking them at one time but over the course of the day. She says she ran out of her medication and then took all her husband's propanolol without his knowledge. She says she has been drinking alcohol but denies she drinks to intoxication, stating she typically drinks one can of "Four Loco". She denies drinking daily. Pt states she knows she should not be taking other people's medications but has panic attacks 1-2 times per week, explaining she experiences tightness in her chest and her heart races. She denies current suicidal ideation or history of suicide attempts, adding that she does not have suicidal thoughts. She denies depressive symptoms, describing her mood as good when her anxiety is controlled. She reports some difficulty sleep, averaging 5-6 hours per night, and sometimes takes melatonin. Pt denies any history of intentional self-injurious behaviors. Pt denies current homicidal ideation or history of violence. Pt denies any history of auditory or visual hallucinations. Pt denies use of marijuana, cocaine, and methamphetamines but has taken other people's pain medication recently.  Pt cannot identify any stressors. She says she has been on disability since adolescence due to epilepsy. She explains her husband of 9 years is also disabled and they live in an apartment through an [employer] housing program. She describes her relationship with her husband as good. They have no children. She denies history of abuse. She denies legal problems. She denies access to firearms.  Pt says she sees Dr. Loralie Champagne with [facility]. She says she is prescribed Keppra, Lamictal, and propanolol. She does not have a therapist. She reports one previous psychiatric hospitalization as an adolescent following the death of her mother.  TTS contacted petitioner Jamison Oka, [facility] staff, at [phone].  She says concern is that  Pt is taking prescription medications that are not prescribed to her and too much of these medications. She says staff has talked to her in the past about doing this and Pt has said she will stop but continues to take too many medications. She says Pt does not appear to understand the consequences, that she and her husband could lose their housing if this behavior continues. She says Pt does not have a diagnosis of intellectual disability. Ms Elton Sin states she does not believe Pt is intentionally trying to harm herself and there is no evidence of suicidal ideation. She says Pt has not made threats to harm others and has exhibited no evidence of psychosis.   With Pt's consent, TTS spoke to Pt's husband, Ulah Olmo at [phone]. He says Pt has been taking his medication in addition to her own. He says she appears to be taking 9 tabs of propanolol daily. He says she told him she takes the medications because she feels anxious. He states she has not appeared impaired and has been doing her normal activities, such as playing on her phone. He says she has not appeared depressed. He says she is not suicidal, has no history of aggression, and has no psychotic symptoms.   Pt is casually dressed and wears eyeglasses. She is alert and oriented x4. Pt speaks in a clear tone, at moderate volume and normal pace. Motor behavior appears normal. Eye contact is good. Pt's mood is anxious and affect is congruent with mood. Thought process is coherent and relevant. There is no indication Pt is currently responding to internal stimuli or experiencing delusional thought content. She is cooperative.  Chief Complaint:  Chief Complaint  Patient presents with   Anxiety   Visit Diagnosis: F41.1 Generalized anxiety disorder   CCA Screening, Triage and Referral (STR)  Patient Reported Information How did you hear about [place]? Legal System  What Is the Reason for Your Visit/Call Today? Pt petitioned for invountary  commitment by housing Engineer, agricultural. Affidavit and petition states Pt has diagnosis of major depressive disorder and is taking medications from her neighbor (oxycodone and zanaflex) and stealing her husband's medication (propanolol). Affidavit also states Pt is drinking alcohol while taking medications. Petitioner states to TTS that Pt is not suicidal but is using poor judgment by taking excessive amounts of medications that are not prescribed to her with alcohol. Pt reports she has severe anxiety and says she has been taking 9 tabs of propanolol daily. She acknowledges drinking 1 can of Four Loco today and denies drinking alcohol daily. She denies current suicidal ideation, homicidal ideation, or psychotic symptoms.  How Long Has This Been Causing You Problems? 1 wk - 1 month  What Do You Feel Would Help You the Most Today? Medication(s)   Have You Recently Had Any Thoughts About Hurting Yourself? No  Are You Planning to Commit Suicide/Harm Yourself At This time? No   Have you Recently Had Thoughts About Hurting Someone Karolee Ohs? No  Are You Planning to Harm Someone at This Time? No  Explanation: No data recorded  Have You Used Any Alcohol or Drugs in the Past 24 Hours? Yes  How Long Ago Did You Use Drugs or Alcohol? No data recorded What Did You Use and How Much? 1 can of Four Loco   Do You Currently Have a Therapist/Psychiatrist? Yes  Name of Therapist/Psychiatrist: Pt reports she sees "Dr Val Eagle." at [facility]   Have You Been Recently  Discharged From Any Office Practice or Programs? No  Explanation of Discharge From Practice/Program: No data recorded    CCA Screening Triage Referral Assessment Type of Contact: Face-to-Face  Telemedicine Service Delivery:   Is this Initial or Reassessment? No data recorded Date Telepsych consult ordered in [facility]:  No data recorded Time Telepsych consult ordered in [facility]:  No data recorded Location of Assessment: [facility] Assessment  Services  Provider Location: [facility] Assessment Services   Collateral Involvement: Pt's husband: Demia Viera [phone]. [facility] staff: Jamison Oka [phone].   Does Patient Have a Automotive engineer Guardian? No data recorded Name and Contact of Legal Guardian: No data recorded If Minor and Not Living with Parent(s), Who has Custody? NA  Is CPS involved or ever been involved? Never  Is APS involved or ever been involved? Never   Patient Determined To Be At Risk for Harm To Self or Others Based on Review of Patient Reported Information or Presenting Complaint? No  Method: No data recorded Availability of Means: No data recorded Intent: No data recorded Notification Required: No data recorded Additional Information for Danger to Others Potential: No data recorded Additional Comments for Danger to Others Potential: No data recorded Are There Guns or Other Weapons in Your Home? No data recorded Types of Guns/Weapons: No data recorded Are These Weapons Safely Secured?                            No data recorded Who Could Verify You Are Able To Have These Secured: No data recorded Do You Have any Outstanding Charges, Pending Court Dates, Parole/Probation? No data recorded Contacted To Inform of Risk of Harm To Self or Others: Patent examiner; Family/Significant Other:    Does Patient Present under Involuntary Commitment? Yes  IVC Papers Initial File Date: 03/08/22   [place] of Residence: [place]   Patient Currently Receiving the Following Services: No data recorded  Determination of Need: Emergent (2 hours)   Options For Referral: Inpatient Hospitalization; Medication Management; Outpatient Therapy     CCA Biopsychosocial Patient Reported Schizophrenia/Schizoaffective Diagnosis in Past: No   Strengths: Pt has good family support   Mental Health Symptoms Depression:   None   Duration of Depressive symptoms:    Mania:   None    Anxiety:    Worrying; Tension   Psychosis:   None   Duration of Psychotic symptoms:    Trauma:   None   Obsessions:   None   Compulsions:   None   Inattention:   N/A   Hyperactivity/Impulsivity:   N/A   Oppositional/Defiant Behaviors:   N/A   Emotional Irregularity:   None   Other Mood/Personality Symptoms:   NA    Mental Status Exam Appearance and self-care  Stature:   Average   Weight:   Obese   Clothing:   Casual   Grooming:   Normal   Cosmetic use:   Age appropriate   Posture/gait:   Normal   Motor activity:   Not Remarkable   Sensorium  Attention:   Normal   Concentration:   Normal   Orientation:   X5   Recall/memory:   Normal   Affect and Mood  Affect:   Appropriate   Mood:   Anxious   Relating  Eye contact:   Normal   Facial expression:   Responsive   Attitude toward examiner:   Cooperative   Thought and Language  Speech flow:  Clear and Coherent; Normal   Thought content:   Appropriate to Mood and Circumstances   Preoccupation:   None   Hallucinations:   None   Organization:  No data recorded  [employer] of Knowledge:   Fair   Intelligence:   Needs investigation   Abstraction:   Functional   Judgement:   Fair   Reality Testing:   Adequate   Insight:   Lacking   Decision Making:   Normal   Social Functioning  Social Maturity:   Responsible   Social Judgement:   Normal   Stress  Stressors:   Other (Comment) (Pt denies)   Coping Ability:   Normal   Skill Deficits:   None   Supports:   Family     Religion: Religion/Spirituality Are You A Religious Person?: No How Might This Affect Treatment?: NA  Leisure/Recreation: Leisure / Recreation Do You Have Hobbies?: Yes Leisure and Hobbies: Playing on her phone  Exercise/Diet: Exercise/Diet Do You Exercise?: No Have You Gained or Lost A Significant Amount of Weight in the Past Six Months?: No Do You  Follow a Special Diet?: No Do You Have Any Trouble Sleeping?: Yes Explanation of Sleeping Difficulties: Pt reports sleeping 5-6 hours per night.   CCA Employment/Education Employment/Work Situation: Employment / Work Systems developer: On disability Why is Patient on Disability: Epilepsy How Long has Patient Been on Disability: Since adolescent Patient's Job has Been Impacted by Current Illness: No Has Patient ever Been in the [employer]?: No  Education: Education Is Patient Currently Attending School?: No Last Grade Completed: 12 Did You Attend College?: No Did You Have An Individualized Education Program (IIEP): No Did You Have Any Difficulty At School?: No Patient's Education Has Been Impacted by Current Illness: No   CCA Family/Childhood History Family and Relationship History: Family history Marital status: Married Number of Years Married: 9 What types of issues is patient dealing with in the relationship?: Pt and husband are both on disability Does patient have children?: No  Childhood History:  Childhood History By whom was/is the patient raised?: Mother Did patient suffer any verbal/emotional/physical/sexual abuse as a child?: No Did patient suffer from severe childhood neglect?: No Has patient ever been sexually abused/assaulted/raped as an adolescent or adult?: No Was the patient ever a victim of a crime or a disaster?: No Witnessed domestic violence?: No Has patient been affected by domestic violence as an adult?: No  Child/Adolescent Assessment:     CCA Substance Use Alcohol/Drug Use: Alcohol / Drug Use Pain Medications: Pt has taken neighbor's oxycodone Prescriptions: Pt is taking other people's prescription medications Over the Counter: Pt denies abuse History of alcohol / drug use?: Yes Longest period of sobriety (when/how long): unknown Negative Consequences of Use: Personal relationships Withdrawal Symptoms: None Substance #1 Name  of Substance 1: Propanolol 1 - Age of First Use: unknown 1 - Amount (size/oz): Pt reports taking 9 tabs daily 1 - Frequency: Daily 1 - Duration: Ongoing 1 - Last Use / Amount: 03/08/2022 1 - Method of Aquiring: Prescription and taking husband's medication 1- Route of Use: Oral ingestion                       ASAM's:  Six Dimensions of Multidimensional Assessment  Dimension 1:  Acute Intoxication and/or Withdrawal Potential:      Dimension 2:  Biomedical Conditions and Complications:      Dimension 3:  Emotional, Behavioral, or Cognitive Conditions  and Complications:     Dimension 4:  Readiness to Change:     Dimension 5:  Relapse, Continued use, or Continued Problem Potential:     Dimension 6:  Recovery/Living Environment:     ASAM Severity Score:    ASAM Recommended Level of Treatment:     Substance use Disorder (SUD)    Recommendations for Services/Supports/Treatments:    Discharge Disposition: Discharge Disposition Medical Exam completed: Yes Disposition of Patient: Discharge  DSM5 Diagnoses: Patient Active Problem List   Diagnosis Date Noted   Cholelithiasis 01/16/2021   Gallstones 01/15/2021   Epigastric pain    Elevated LFTs    Hypokalemia    Non-intractable vomiting    QT prolongation    Overdose of sedative or hypnotic 11/06/2019   Major depressive disorder, recurrent episode, severe (HCC) 11/04/2019   Seizure (HCC) 11/03/2019   Medical non-compliance 05/13/2014   Neurologic abnormality 05/13/2014   Folliculitis 05/13/2014   UTI (lower urinary tract infection) 02/18/2014   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

## 2022-03-08 NOTE — Progress Notes (Signed)
   03/08/22 1955  [facility]UC Triage Screening (Walk-ins at [facility] only)  How Did You Hear About [place]? Legal System  What Is the Reason for Your Visit/Call Today? Pt petitioned for invountary commitment by housing Engineer, agricultural. Affidavit and petition states Pt has diagnosis of major depressive disorder and is taking medications from her neighbor (oxycodone and zanaflex) and stealing her husband's medication (propanolol). Affidavit also states Pt is drinking alcohol while taking medications. Petitioner states to TTS that Pt is not suicidal but is using poor judgment by taking excessive amounts of medications that are not prescribed to her with alcohol. Pt reports she has severe anxiety and says she has been taking 9 tabs of propanolol daily. She acknowledges drinking 1 can of Four Loco today and denies drinking alcohol daily. She denies current suicidal ideation, homicidal ideation, or psychotic symptoms.  How Long Has This Been Causing You Problems? 1 wk - 1 month  Have You Recently Had Any Thoughts About Hurting Yourself? No  Are You Planning to Commit Suicide/Harm Yourself At This time? No  Have you Recently Had Thoughts About Hurting Someone Karolee Ohs? No  Are You Planning To Harm Someone At This Time? No  Are you currently experiencing any auditory, visual or other hallucinations? No  Have You Used Any Alcohol or Drugs in the Past 24 Hours? Yes  How long ago did you use Drugs or Alcohol? Today  What Did You Use and How Much? 1 can of Four Loco  Do you have any current medical co-morbidities that require immediate attention? No  Clinician description of patient physical appearance/behavior: Pt is casually dressed and wears eyeglasses. She is alert and oriented x4. Pt speaks in a clear tone, at moderate volume and normal pace. Motor behavior appears normal. Eye contact is good. Pt's mood is anxious and affect is congruent with mood. Thought process is coherent and relevant. There is no indication Pt is currently  responding to internal stimuli or experiencing delusional thought content. She is cooperative.  What Do You Feel Would Help You the Most Today? Medication(s)  If access to [facility] was not available, would you have sought care in the Emergency Department? Yes  Determination of Need Emergent (2 hours)  Options For Referral Inpatient Hospitalization;Medication Management;Outpatient Therapy

## 2022-03-08 NOTE — ED Provider Notes (Signed)
[facility] Continuous Assessment Admission H&P  Date: 03/08/22 Patient Name: Jacqueline Jones MRN: [id number] Chief Complaint:  Chief Complaint  Patient presents with   Anxiety      Diagnoses:  Final diagnoses:  None    HPI: Jacqueline Jones, 31 y.o female,   With a history of major depressive disorder, and seizure disorders, presented to [facility] via [facility], under IVC.  Per IVC respondent suffer from major depressive disorder.  She is taking medications from her neighbors (oxycodone and Zanaflex) and stealing medications from her husband out of a locked box.  Respondent has admitted to taking the pills she is getting.  Respondents also admitted to drinking 3-4 MAD DOG 20/20's a day.  Husband stated respondent has overdosed in the past.      Per resend  IVC respondent acknowledged she has taken her husband propranolol and has been using approximately 9 tablets over the course of a day to relieve anxiety.  She also report drinking 1 can of [place] but denies daily alcohol use or drinking to intoxication.  She denies depressive symptoms.  She denies current suicidal ideation or history of suicide attempts.  She denies homicidal ideation or history of aggression.  She denies psychotic symptoms.  Respondent's husband, Jacqueline Jones, and petitioner Jacqueline Jones, both agrees respondent has made no indication that she intended to harm herself but are concerned she is using poor judgment by taking medications that are not prescribed to her with alcohol, respondent does not meet criteria for involuntary commitment as there is no immediate risk for harm to self or others  Face-to-face observation of patient, patient is alert and oriented x 4, speech is clear.  Mood is labile affect is flat.  Patient denies SI, HI, AVH, or paranoia.  Patient did acknowledge that she took the medicines by poor judgment.  According to patient she has  bladder taken out, and was put on pain medicine.  According to patient  have been taking my husband prescription. Patient also reports she drinks once a week, reports she smokes cigarettes denies illicit drug use.  According to patient she is seeing a doctor at [facility].  And he is the one that prescribes her psychiatric medicine. Per the patient she was diagnosed with bipolar, depression, PTSD and anxiety.  Recommend overnight observation due to the fact patient has a history of drug overdose.   PHQ 2-9:   Flowsheet Row ED from 03/08/2022 in [facility] ED from 02/17/2022 in [facility] EMERGENCY DEPARTMENT ED from 01/15/2022 in [facility] EMERGENCY DEPARTMENT  C-SSRS RISK CATEGORY No Risk No Risk No Risk        Total Time spent with patient: 20 minutes  Musculoskeletal  Strength & Muscle Tone: within normal limits Gait & Station: normal Patient leans: N/A  Psychiatric Specialty Exam  Presentation General Appearance: Casual  Eye Contact:Fair  Speech:Clear and Coherent  Speech Volume:Normal  Handedness:Ambidextrous   Mood and Affect  Mood:Anxious; Labile  Affect:Flat   Thought Process  Thought Processes:Linear  Descriptions of Associations:Circumstantial  Orientation:Full (Time, Place and Person)  Thought Content:WDL  Diagnosis of Schizophrenia or Schizoaffective disorder in past: No   Hallucinations:Hallucinations: None  Ideas of Reference:None  Suicidal Thoughts:Suicidal Thoughts: No  Homicidal Thoughts:Homicidal Thoughts: No   Sensorium  Memory:Immediate Fair  Judgment:Poor  Insight:Poor   Executive Functions  Concentration:Fair  Attention Span:Fair  Recall:Fair  Fund of Knowledge:Fair  Language:Fair   Psychomotor Activity  Psychomotor Activity:Psychomotor  Activity: Normal   Assets  Assets:Desire for Improvement   Sleep  Sleep:Sleep: Fair   Nutritional Assessment (For OBS and FBC admissions only) Has the patient had a weight loss or  gain of 10 pounds or more in the last 3 months?: No Has the patient had a decrease in food intake/or appetite?: No Does the patient have dental problems?: No Does the patient have eating habits or behaviors that may be indicators of an eating disorder including binging or inducing vomiting?: No Has the patient recently lost weight without trying?: 0 Has the patient been eating poorly because of a decreased appetite?: 0 Malnutrition Screening Tool Score: 0    Physical Exam HENT:     Head: Normocephalic.     Nose: Nose normal.  Cardiovascular:     Rate and Rhythm: Normal rate.  Pulmonary:     Effort: Pulmonary effort is normal.  Musculoskeletal:        General: Normal range of motion.     Cervical back: Normal range of motion.  Skin:    General: Skin is warm.  Neurological:     General: No focal deficit present.     Mental Status: She is alert.  Psychiatric:        Mood and Affect: Mood normal.        Behavior: Behavior normal.        Thought Content: Thought content normal.        Judgment: Judgment normal.    Review of Systems  Constitutional: Negative.   HENT: Negative.    Eyes: Negative.   Respiratory: Negative.    Cardiovascular: Negative.   Gastrointestinal: Negative.   Genitourinary: Negative.   Musculoskeletal: Negative.   Skin: Negative.   Neurological: Negative.   Endo/Heme/Allergies: Negative.   Psychiatric/Behavioral:  Positive for substance abuse.     Blood pressure 105/74, pulse 76, temperature 98 F (36.7 C), temperature source Oral, resp. rate 18, last menstrual period 01/17/2022, SpO2 96 %. There is no height or weight on file to calculate BMI.  Past Psychiatric History: PTSD, depression, bipolar disorder, anxiety  Is the patient at risk to self? No  Has the patient been a risk to self in the past 6 months? No .    Has the patient been a risk to self within the distant past? No   Is the patient a risk to others? No   Has the patient been a risk  to others in the past 6 months? No   Has the patient been a risk to others within the distant past? No   Past Medical History:  Past Medical History:  Diagnosis Date   Anemia    Anxiety    Anxiety    Asthma    Bipolar disorder ([place])    Complication of anesthesia    Pt states she has had seizures during and after surgery   Depression    Depression    Elevated cholesterol    Enlarged liver    Frequent headaches    GERD (gastroesophageal reflux disease)    High blood pressure    IBS (irritable bowel syndrome)    Infection    UTI   Obesity    Pneumonia    Seizures ([place])    unknown cause- started 08/14  "monthly"   Sepsis ([place])    Sleep apnea    No Cpap   UTI (lower urinary tract infection)     Past Surgical History:  Procedure Laterality Date   CHOLECYSTECTOMY N/A  01/22/2021   Procedure: LAPAROSCOPIC CHOLECYSTECTOMY WITH INTRAOPERATIVE CHOLANGIOGRAM;  Surgeon: Donnie Mesa, MD;  Location: [facility] OR;  Service: General;  Laterality: N/A;   COLONOSCOPY     age [age]   DIAGNOSTIC LAPAROSCOPY      Family History:  Family History  Problem Relation Age of Onset   Drug abuse Father    Cancer Father        age orange   Lung cancer Father    Depression Mother    Anxiety disorder Mother    Drug abuse Brother        incarcirated    Colon cancer Neg Hx     Social History:  Social History   Socioeconomic History   Marital status: Married    Spouse name: Not on file   Number of children: 0   Years of education: Not on file   Highest education level: Not on file  Occupational History   Occupation: disable  Tobacco Use   Smoking status: Every Day    Packs/day: 0.25    Years: 10.00    Total pack years: 2.50    Types: Cigarettes, Cigars   Smokeless tobacco: Former   Tobacco comments:    she is wanting to quit trying --  pt states smokes 3 cigs/day  Vaping Use   Vaping Use: Never used  Substance and Sexual Activity   Alcohol use: Not Currently   Drug  use: Not Currently    Frequency: 7.0 times per week   Sexual activity: Yes    Birth control/protection: None  Other Topics Concern   Not on file  Social History Narrative   ** Merged History Encounter **       Social Determinants of Health   Financial Resource Strain: Not on file  Food Insecurity: Not on file  Transportation Needs: Not on file  Physical Activity: Not on file  Stress: Not on file  Social Connections: Not on file  Intimate Partner Violence: Not on file    SDOH:  SDOH Screenings   Alcohol Screen: Low Risk  (11/06/2019)   Alcohol Screen    Last Alcohol Screening Score (AUDIT): 0  Depression (PHQ2-9): Not on file  Financial Resource Strain: Not on file  Food Insecurity: Not on file  Housing: Not on file  Physical Activity: Not on file  Social Connections: Not on file  Stress: Not on file  Tobacco Use: High Risk (02/17/2022)   Patient History    Smoking Tobacco Use: Every Day    Smokeless Tobacco Use: Former    Passive Exposure: Not on file  Transportation Needs: Not on file    Last Labs:  Admission on 02/17/2022, Discharged on 02/17/2022  Component Date Value Ref Range Status   WBC 02/17/2022 13.0 (H)  4.0 - 10.5 K/uL Final   RBC 02/17/2022 5.42 (H)  3.87 - 5.11 MIL/uL Final   Hemoglobin 02/17/2022 15.0  12.0 - 15.0 g/dL Final   HCT 02/17/2022 46.4 (H)  36.0 - 46.0 % Final   MCV 02/17/2022 85.6  80.0 - 100.0 fL Final   MCH 02/17/2022 27.7  26.0 - 34.0 pg Final   MCHC 02/17/2022 32.3  30.0 - 36.0 g/dL Final   RDW 02/17/2022 16.1 (H)  11.5 - 15.5 % Final   Platelets 02/17/2022 263  150 - 400 K/uL Final   nRBC 02/17/2022 0.0  0.0 - 0.2 % Final   Neutrophils Relative % 02/17/2022 76  % Final   Neutro Abs 02/17/2022 9.9 (  H)  1.7 - 7.7 K/uL Final   Lymphocytes Relative 02/17/2022 16  % Final   Lymphs Abs 02/17/2022 2.0  0.7 - 4.0 K/uL Final   Monocytes Relative 02/17/2022 6  % Final   Monocytes Absolute 02/17/2022 0.8  0.1 - 1.0 K/uL Final    Eosinophils Relative 02/17/2022 1  % Final   Eosinophils Absolute 02/17/2022 0.1  0.0 - 0.5 K/uL Final   Basophils Relative 02/17/2022 0  % Final   Basophils Absolute 02/17/2022 0.1  0.0 - 0.1 K/uL Final   Immature Granulocytes 02/17/2022 1  % Final   Abs Immature Granulocytes 02/17/2022 0.14 (H)  0.00 - 0.07 K/uL Final   Performed at [facility], [address], [place], [place] [address]   Sodium 02/17/2022 139  135 - 145 mmol/L Final   Potassium 02/17/2022 4.5  3.5 - 5.1 mmol/L Final   Chloride 02/17/2022 107  98 - 111 mmol/L Final   CO2 02/17/2022 20 (L)  22 - 32 mmol/L Final   Glucose, Bld 02/17/2022 103 (H)  70 - 99 mg/dL Final   Glucose reference range applies only to samples taken after fasting for at least 8 hours.   BUN 02/17/2022 8  6 - 20 mg/dL Final   Creatinine, Ser 02/17/2022 0.78  0.44 - 1.00 mg/dL Final   Calcium 81/82/9937 9.3  8.9 - 10.3 mg/dL Final   Total Protein 16/96/7893 7.3  6.5 - 8.1 g/dL Final   Albumin 81/07/7508 4.0  3.5 - 5.0 g/dL Final   AST 25/85/2778 46 (H)  15 - 41 U/L Final   ALT 02/17/2022 32  0 - 44 U/L Final   Alkaline Phosphatase 02/17/2022 133 (H)  38 - 126 U/L Final   Total Bilirubin 02/17/2022 0.3  0.3 - 1.2 mg/dL Final   GFR, Estimated 02/17/2022 >60  >60 mL/min Final   Comment: (NOTE) Calculated using the CKD-EPI Creatinine Equation (2021)    Anion gap 02/17/2022 12  5 - 15 Final   Performed at [facility], [address], [place], [place] [address]   I-stat hCG, quantitative 02/17/2022 <5.0  <5 mIU/mL Final   Comment 3 02/17/2022          Final   Comment:   GEST. AGE      CONC.  (mIU/mL)   <=1 WEEK        5 - 50     2 WEEKS       50 - 500     3 WEEKS       100 - 10,000     4 WEEKS     1,000 - 30,000        FEMALE AND NON-PREGNANT FEMALE:     LESS THAN 5 mIU/mL    Lamotrigine Lvl 02/17/2022 2.8  2.0 - 20.0 ug/mL Final   Comment: (NOTE)                                Detection Limit = 1.0 Performed At: [facility] [address] [place], [place] [id number] Jolene Schimke MD MG:[id number]    Levetiracetam Lvl 02/17/2022 2.1 (L)  10.0 - 40.0 ug/mL Final   Comment: (NOTE) Performed At: [facility] [address] [place], [place] [id number] Jolene Schimke MD DX:[id number]    Color, Urine 02/17/2022 YELLOW  YELLOW Final   APPearance 02/17/2022 HAZY (A)  CLEAR Final   Specific Gravity, Urine  02/17/2022 1.015  1.005 - 1.030 Final   pH 02/17/2022 5.0  5.0 - 8.0 Final   Glucose, UA 02/17/2022 NEGATIVE  NEGATIVE mg/dL Final   Hgb urine dipstick 02/17/2022 SMALL (A)  NEGATIVE Final   Bilirubin Urine 02/17/2022 NEGATIVE  NEGATIVE Final   Ketones, ur 02/17/2022 5 (A)  NEGATIVE mg/dL Final   Protein, ur 02/17/2022 30 (A)  NEGATIVE mg/dL Final   Nitrite 02/17/2022 POSITIVE (A)  NEGATIVE Final   Leukocytes,Ua 02/17/2022 TRACE (A)  NEGATIVE Final   RBC / HPF 02/17/2022 0-5  0 - 5 RBC/hpf Final   WBC, UA 02/17/2022 21-50  0 - 5 WBC/hpf Final   Bacteria, UA 02/17/2022 MANY (A)  NONE SEEN Final   Squamous Epithelial / LPF 02/17/2022 0-5  0 - 5 Final   Mucus 02/17/2022 PRESENT   Final   Performed at [facility], [place] [address], [place], [place] [address]   Opiates 02/17/2022 NONE DETECTED  NONE DETECTED Final   Cocaine 02/17/2022 NONE DETECTED  NONE DETECTED Final   Benzodiazepines 02/17/2022 NONE DETECTED  NONE DETECTED Final   Amphetamines 02/17/2022 NONE DETECTED  NONE DETECTED Final   Tetrahydrocannabinol 02/17/2022 NONE DETECTED  NONE DETECTED Final   Barbiturates 02/17/2022 NONE DETECTED  NONE DETECTED Final   Comment: (NOTE) DRUG SCREEN FOR MEDICAL PURPOSES ONLY.  IF CONFIRMATION IS NEEDED FOR ANY PURPOSE, NOTIFY LAB WITHIN 5 DAYS.  LOWEST DETECTABLE LIMITS FOR URINE DRUG SCREEN Drug Class                     Cutoff (ng/mL) Amphetamine and metabolites    1000 Barbiturate and metabolites    200 Benzodiazepine                 [id number] Tricyclics and metabolites      300 Opiates and metabolites        300 Cocaine and metabolites        300 THC                            50 Performed at [facility], [place] [address], [place], [place] [address]     Allergies: Dilaudid [hydromorphone], Toradol [ketorolac tromethamine], Clonazepam, Flexeril [cyclobenzaprine], Gabapentin, Tramadol, Vicodin [hydrocodone-acetaminophen], Adhesive [tape], Azithromycin, Benzonatate, Keflet [cephalexin], Latex, and Levaquin [levofloxacin in d5w]  PTA Medications: (Not in a hospital admission)   Medical Decision Making  Inpatient observation    Recommendations  Based on my evaluation the patient does not appear to have an emergency medical condition.  Evette Georges, NP 03/08/22  9:59 PM

## 2022-03-08 NOTE — ED Notes (Signed)
Pt could not give urine. She was given a specimen cup

## 2022-03-09 LAB — RESP PANEL BY RT-PCR (FLU A&B, COVID) ARPGX2
Influenza A by PCR: NEGATIVE
Influenza B by PCR: NEGATIVE
SARS Coronavirus 2 by RT PCR: NEGATIVE

## 2022-03-09 LAB — URINALYSIS, ROUTINE W REFLEX MICROSCOPIC
Bilirubin Urine: NEGATIVE
Glucose, UA: NEGATIVE mg/dL
Hgb urine dipstick: NEGATIVE
Ketones, ur: NEGATIVE mg/dL
Leukocytes,Ua: NEGATIVE
Nitrite: NEGATIVE
Protein, ur: NEGATIVE mg/dL
Specific Gravity, Urine: 1.016 (ref 1.005–1.030)
pH: 7 (ref 5.0–8.0)

## 2022-03-09 LAB — COMPREHENSIVE METABOLIC PANEL
ALT: 34 U/L (ref 0–44)
AST: 33 U/L (ref 15–41)
Albumin: 3.9 g/dL (ref 3.5–5.0)
Alkaline Phosphatase: 144 U/L — ABNORMAL HIGH (ref 38–126)
Anion gap: 9 (ref 5–15)
BUN: 7 mg/dL (ref 6–20)
CO2: 25 mmol/L (ref 22–32)
Calcium: 9.2 mg/dL (ref 8.9–10.3)
Chloride: 106 mmol/L (ref 98–111)
Creatinine, Ser: 0.74 mg/dL (ref 0.44–1.00)
GFR, Estimated: >60 mL/min (ref >60)
Glucose, Bld: 90 mg/dL (ref 70–99)
Potassium: 3.9 mmol/L (ref 3.5–5.1)
Sodium: 140 mmol/L (ref 135–145)
Total Bilirubin: 0.4 mg/dL (ref 0.3–1.2)
Total Protein: 7 g/dL (ref 6.5–8.1)

## 2022-03-09 LAB — HEMOGLOBIN A1C
Hgb A1c MFr Bld: 5.4 % (ref 4.8–5.6)
Mean Plasma Glucose: 108.28 mg/dL

## 2022-03-09 LAB — LIPID PANEL
Cholesterol: 225 mg/dL — ABNORMAL HIGH (ref 0–200)
HDL: 41 mg/dL (ref >40)
LDL Cholesterol: 108 mg/dL — ABNORMAL HIGH (ref 0–99)
Total CHOL/HDL Ratio: 5.5 RATIO
Triglycerides: 382 mg/dL — ABNORMAL HIGH (ref <150)
VLDL: 76 mg/dL — ABNORMAL HIGH (ref 0–40)

## 2022-03-09 LAB — POCT URINE DRUG SCREEN - MANUAL ENTRY (I-SCREEN)
POC Amphetamine UR: NOT DETECTED
POC Buprenorphine (BUP): NOT DETECTED
POC Cocaine UR: NOT DETECTED
POC Marijuana UR: NOT DETECTED
POC Methadone UR: NOT DETECTED
POC Methamphetamine UR: NOT DETECTED
POC Morphine: NOT DETECTED
POC Oxazepam (BZO): NOT DETECTED
POC Oxycodone UR: NOT DETECTED
POC Secobarbital (BAR): NOT DETECTED

## 2022-03-09 LAB — PREGNANCY, URINE: Preg Test, Ur: NEGATIVE

## 2022-03-09 LAB — ETHANOL: Alcohol, Ethyl (B): 104 mg/dL — ABNORMAL HIGH (ref <10)

## 2022-03-09 LAB — TSH: TSH: 1.451 u[IU]/mL (ref 0.350–4.500)

## 2022-03-09 NOTE — ED Provider Notes (Signed)
FBC/OBS ASAP Discharge Summary  Date and Time: 03/09/2022 12:12 PM  Name: Jacqueline Jones  MRN:  [id number]   Discharge Diagnoses:  Final diagnoses:  Noncompliance with medication treatment due to abuse of medication  Generalized anxiety disorder  Behavior concern in adult    Subjective: Jacqueline Jones 31 y.o., female patient presented to [facility] as a walk in on 02/23/2022 via [facility] under IVC.  She was admitted to the continuous assessment unit for overnight observation to be reassessed by psychiatry in the a.m.    Per IVC petition by Luz Brazen staff member [place].  "Respondent suffers from major depressive disorder.  She is taking medications from her neighbor (oxycodone and Zanaflex) instilling medication from her husband and of a lock box.  Respondent has admitted to taking the pills she is getting.  Respondent has also admitted to drinking 3-4 mad dog 20/20's a day.  Husband said respondent has overdosed in the past".  Upon admission patient did not meet the criteria for involuntary commitment and first exam was completed releasing patient from the commitment.  She was voluntarily admitted to the continuous assessment unit.  Jacqueline Jones, 30 y.o., female patient seen face to face by this provider, consulted with Dr. Dwyane Dee; and chart reviewed on 03/09/22.  Patient reports she is diagnosed with bipolar, MDD, anxiety and seizure disorder.  Her last seizure was on 7/26/203. She reports compliance with medications, which include Keppra and Lamictal. She sees Dr. Addison Lank with [facility]. She lives in an apartment with her spouse through the [place] housing program due to her and her spouses mental disabilities.  On evaluation Jacqueline Jones is requesting to be discharged. She admits she takes her spouses propranolol, but insist it was to help with her anxiety. She admits to taking the bottle roughly a month ago and takes up to 9 of the tablets  through out the day since that time  Educated her on medication and adverse side affects and repeatedly instructed her not to take medication that is not prescribed to her.  She denies taking medications from her neighbors, as mentioned in the IVC.  She endorses drinking 3-4 Mad Dog 20/20s daily.   During evaluation [name] is observed sitting in her bed in the observation unit.  She is alert/oriented x4, cooperative and attentive.  She is fairly groomed and makes good eye contact.  She has normal speech.  She is anxious and ask when she could be discharged.  She is denying depression at this time. She denies SI/HI/AVH.She contracts for safety and denies any access to firearms/weapons. Objectively  she does not appear to be responding to internal/external stimuli.  There is no evidence of psychosis/mania paranoia or delusional thinking.  Patient is able to converse coherently and answered question appropriately.     Discussed substance abuse treatment.  Patient is willing to participate in substance abuse treatment only if is on outpatient basis.  She was referred to CD IOP with behavioral health on the second floor.  Patient is willing to participate.  Discussed the dangers of taking medications that are not prescribed to her.  Explained that many medications may interact with her medications that she takes for seizure disorder and this could put her at an increased risk to have seizures and possibly death.  She verbalized understanding.  Collateral: With patient's permission contacted Jacqueline Jones (spouse) and Jacqueline Jones (staff at [facility]) [phone]. Both were on 3  way Jones with this Clinical research associate.  Discussed that patient does not meet criteria for inpatient psychiatric admission, nor does she meet criteria to uphold the IVC.  Spouse and Chanel expressed their concerns due to patient taking her husband's medications and also taking medications from her neighbor.  She now expressed  concern that if patient continues to take medications that does not belong to her she would be at risk for losing her [employer] housing.  Discussed CD IOP program and the patient was being referred.  Spouse had no immediate safety concerns with patient being discharged.  States he has not heard patient make any comments to harm herself in any way.  Reports her overdose in the past was unintentional as she was taking medications that did not belong to her.  Stay Summary:   Patient remained calm and cooperative while on the unit.  She was compliant with staff and other patients.  She continued to deny SI/HI/AVH.  She does not meet criteria for inpatient psychiatric admission nor does she meet criteria for IVC.   Total Time spent with patient: 30 minutes  Past Psychiatric History: see h&P Past Medical History:  Past Medical History:  Diagnosis Date   Anemia    Anxiety    Anxiety    Asthma    Bipolar disorder (HCC)    Complication of anesthesia    Pt states she has had seizures during and after surgery   Depression    Depression    Elevated cholesterol    Enlarged liver    Frequent headaches    GERD (gastroesophageal reflux disease)    High blood pressure    IBS (irritable bowel syndrome)    Infection    UTI   Obesity    Pneumonia    Seizures (HCC)    unknown cause- started 08/14  "monthly"   Sepsis (HCC)    Sleep apnea    No Cpap   UTI (lower urinary tract infection)     Past Surgical History:  Procedure Laterality Date   CHOLECYSTECTOMY N/A 01/22/2021   Procedure: LAPAROSCOPIC CHOLECYSTECTOMY WITH INTRAOPERATIVE CHOLANGIOGRAM;  Surgeon: Manus Rudd, MD;  Location: [facility] OR;  Service: General;  Laterality: N/A;   COLONOSCOPY     age [age]   DIAGNOSTIC LAPAROSCOPY     Family History:  Family History  Problem Relation Age of Onset   Drug abuse Father    Cancer Father        age orange   Lung cancer Father    Depression Mother    Anxiety disorder Mother     Drug abuse Brother        incarcirated    Colon cancer Neg Hx    Family Psychiatric History: See H&p Social History:  Social History   Substance and Sexual Activity  Alcohol Use Not Currently     Social History   Substance and Sexual Activity  Drug Use Not Currently   Frequency: 7.0 times per week    Social History   Socioeconomic History   Marital status: Married    Spouse name: Not on file   Number of children: 0   Years of education: Not on file   Highest education level: Not on file  Occupational History   Occupation: disable  Tobacco Use   Smoking status: Every Day    Packs/day: 0.25    Years: 10.00    Total pack years: 2.50    Types: Cigarettes, Cigars   Smokeless tobacco: Former  Tobacco comments:    she is wanting to quit trying --  pt states smokes 3 cigs/day  Vaping Use   Vaping Use: Never used  Substance and Sexual Activity   Alcohol use: Not Currently   Drug use: Not Currently    Frequency: 7.0 times per week   Sexual activity: Yes    Birth control/protection: None  Other Topics Concern   Not on file  Social History Narrative   ** Merged History Encounter **       Social Determinants of Health   Financial Resource Strain: Not on file  Food Insecurity: Not on file  Transportation Needs: Not on file  Physical Activity: Not on file  Stress: Not on file  Social Connections: Not on file   SDOH:  SDOH Screenings   Alcohol Screen: Low Risk  (11/06/2019)   Alcohol Screen    Last Alcohol Screening Score (AUDIT): 0  Depression (PHQ2-9): Not on file  Financial Resource Strain: Not on file  Food Insecurity: Not on file  Housing: Not on file  Physical Activity: Not on file  Social Connections: Not on file  Stress: Not on file  Tobacco Use: High Risk (02/17/2022)   Patient History    Smoking Tobacco Use: Every Day    Smokeless Tobacco Use: Former    Passive Exposure: Not on file  Transportation Needs: Not on file    Tobacco Cessation:   N/A, patient does not currently use tobacco products  Current Medications:  Current Facility-Administered Medications  Medication Dose Route Frequency Provider Last Rate Last Admin   albuterol (VENTOLIN HFA) 108 (90 Base) MCG/ACT inhaler 1 puff  1 puff Inhalation Q8H PRN Sindy Guadeloupe, NP       alum & mag hydroxide-simeth (MAALOX/MYLANTA) 200-200-20 MG/5ML suspension 30 mL  30 mL Oral Q4H PRN Sindy Guadeloupe, NP       hydrOXYzine (ATARAX) tablet 25 mg  25 mg Oral TID PRN Sindy Guadeloupe, NP   25 mg at 03/09/22 0658   lamoTRIgine (LAMICTAL) tablet 200 mg  200 mg Oral BID Sindy Guadeloupe, NP   200 mg at 03/09/22 7902   levETIRAcetam (KEPPRA) tablet 1,000 mg  1,000 mg Oral Daily Sindy Guadeloupe, NP   1,000 mg at 03/09/22 4097   levETIRAcetam (KEPPRA) tablet 1,500 mg  1,500 mg Oral QHS Sindy Guadeloupe, NP   1,500 mg at 03/08/22 2305   magnesium hydroxide (MILK OF MAGNESIA) suspension 30 mL  30 mL Oral Daily PRN Sindy Guadeloupe, NP       pantoprazole (PROTONIX) EC tablet 40 mg  40 mg Oral Daily Sindy Guadeloupe, NP   40 mg at 03/09/22 3532   Current Outpatient Medications  Medication Sig Dispense Refill   albuterol (VENTOLIN HFA) 108 (90 Base) MCG/ACT inhaler Inhale 1 puff into the lungs every 8 (eight) hours as needed for wheezing or shortness of breath. 8 g 0   escitalopram (LEXAPRO) 10 MG tablet Take 10 mg by mouth every morning.     hydrOXYzine (ATARAX) 50 MG tablet Take 50 mg by mouth 4 (four) times daily as needed for anxiety.     lamoTRIgine (LAMICTAL) 200 MG tablet Take 200-300 mg by mouth 2 (two) times daily.     levETIRAcetam (KEPPRA) 1000 MG tablet Take 1,000-1,500 mg by mouth 2 (two) times daily. Take one tablet in the morning and one and a half tablets in the evening.     lurasidone (LATUDA) 40 MG TABS tablet Take 40 mg by mouth daily.  pantoprazole (PROTONIX) 40 MG tablet Take 1 tablet (40 mg total) by mouth daily. 30 tablet 3    PTA Medications: (Not in a hospital admission)       No data  to display          [facility] from 03/08/2022 in [facility] ED from 02/17/2022 in [place] ED from 01/15/2022 in [place] No Risk No Risk No Risk       Musculoskeletal  Strength & Muscle Tone: within normal limits Gait & Station: normal Patient leans: N/A  Psychiatric Specialty Exam  Presentation  General Appearance: Casual  Eye Contact:Good  Speech:Clear and Coherent; Normal Rate  Speech Volume:Normal  Handedness:Right   Mood and Affect  Mood:Anxious  Affect:Congruent   Thought Process  Thought Processes:Coherent  Descriptions of Associations:Intact  Orientation:Full (Time, Place and Person)  Thought Content:Logical  Diagnosis of Schizophrenia or Schizoaffective disorder in past: No    Hallucinations:Hallucinations: None  Ideas of Reference:None  Suicidal Thoughts:Suicidal Thoughts: No  Homicidal Thoughts:Homicidal Thoughts: No   Sensorium  Memory:Immediate Good; Recent Good; Remote Good  Judgment:Fair  Insight:Good   Executive Functions  Concentration:Good  Attention Span:Good  [place] of Knowledge:Good  Language:Good   Psychomotor Activity  Psychomotor Activity:Psychomotor Activity: Normal   Assets  Assets:Communication Skills; Desire for Improvement; Financial Resources/Insurance; Housing; Intimacy; Leisure Time; Physical Health; Resilience   Sleep  Sleep:Sleep: Fair   Nutritional Assessment (For OBS and FBC admissions only) Has the patient had a weight loss or gain of 10 pounds or more in the last 3 months?: No Has the patient had a decrease in food intake/or appetite?: No Does the patient have dental problems?: No Does the patient have eating habits or behaviors that may be indicators of an eating disorder including binging or inducing vomiting?: No Has the patient recently  lost weight without trying?: 0 Has the patient been eating poorly because of a decreased appetite?: 0 Malnutrition Screening Tool Score: 0    Physical Exam  Physical Exam Vitals and nursing note reviewed.  Constitutional:      General: She is not in acute distress.    Appearance: Normal appearance. She is not ill-appearing.  HENT:     Head: Normocephalic.  Eyes:     General:        Right eye: No discharge.        Left eye: No discharge.     Conjunctiva/sclera: Conjunctivae normal.     Pupils: Pupils are equal, round, and reactive to light.  Cardiovascular:     Rate and Rhythm: Normal rate.  Pulmonary:     Effort: Pulmonary effort is normal.  Musculoskeletal:        General: Normal range of motion.     Cervical back: Normal range of motion.  Skin:    General: Skin is warm and dry.     Coloration: Skin is not jaundiced or pale.  Neurological:     Mental Status: She is alert and oriented to person, place, and time.  Psychiatric:        Attention and Perception: Attention and perception normal.        Mood and Affect: Mood is anxious. Mood is not depressed.        Speech: Speech normal.        Behavior: Behavior normal. Behavior is cooperative.        Thought Content: Thought content  normal.        Cognition and Memory: Cognition normal.        Judgment: Judgment normal.    Review of Systems  Constitutional: Negative.   HENT: Negative.    Eyes: Negative.   Respiratory: Negative.    Cardiovascular: Negative.   Musculoskeletal: Negative.   Skin: Negative.   Neurological: Negative.   Psychiatric/Behavioral:  Positive for substance abuse. The patient is nervous/anxious.    Blood pressure 105/71, pulse 78, temperature 98.6 F (37 C), temperature source Oral, resp. rate 20, last menstrual period 01/17/2022, SpO2 100 %. There is no height or weight on file to calculate BMI.  Demographic Factors:  Caucasian and Unemployed  Loss Factors: NA  Historical  Factors: NA  Risk Reduction Factors:   Sense of responsibility to family, Living with another person, especially a relative, Positive social support, Positive therapeutic relationship, and Positive coping skills or problem solving skills  Continued Clinical Symptoms:  Severe Anxiety and/or Agitation Bipolar Disorder:   Bipolar II Depression:   Comorbid alcohol abuse/dependence Impulsivity Alcohol/Substance Abuse/Dependencies Epilepsy  Cognitive Features That Contribute To Risk:  None    Suicide Risk:  Minimal: No identifiable suicidal ideation.  Patients presenting with no risk factors but with morbid ruminations; may be classified as minimal risk based on the severity of the depressive symptoms  Plan Of Care/Follow-up recommendations:  Activity:  as tolerated  Diet:  regular   Disposition:   Discharge Patient  Referral made to CD IOP   - SW provided resources  No evidence of imminent risk to self or others at present.    Patient does not meet criteria for psychiatric inpatient admission. Discussed crisis plan, support from social network, calling 911, coming to the Emergency Department, and calling Suicide Hotline. No evidence of imminent risk to self or others at present.    Patient does not meet criteria for psychiatric inpatient admission. Discussed crisis plan, support from social network, calling 911, coming to the Emergency Department, and calling Suicide Hotline.   Revonda Humphrey, NP 03/09/2022, 12:12 PM

## 2022-03-09 NOTE — ED Notes (Signed)
Pt refused lunch

## 2022-03-09 NOTE — ED Notes (Signed)
Pt speaking with provider. No acute distress noted. Safety maintained.

## 2022-03-09 NOTE — ED Notes (Signed)
Pt medicated for anxiety with Atarax 25 mg

## 2022-03-09 NOTE — ED Notes (Signed)
Pt sleeping@this time. Breathing even and unlabored will continue to monitor for safety 

## 2022-03-09 NOTE — ED Notes (Signed)
Offsite distribution contacted for lab pickup. 

## 2022-03-09 NOTE — ED Notes (Signed)
Patient A&O x 4, ambulatory. Patient discharged in no acute distress. Patient denied SI/HI, A/VH upon discharge. Patient verbalized understanding of all discharge instructions explained by staff, to include follow up appointments and safety plan. Pt belongings returned to patient from locker #7  intact. Patient escorted to lobby via staff for transport to destination via [employer] taxi. Safety maintained.

## 2022-03-09 NOTE — ED Notes (Addendum)
Pt sitting up on bed in no acute distress. Denies SI/HI/AVH. When asked by writer cause of admission, pt states, "I did too much Propranolol but it wasn't to hurt myself, my chest was hurting and I wanted it relieved. I won't do that again. My husband bought me here and I don't understand why. May I please discharge?" Pt denies chest pain or discomfort. Informed pt that she would be assessed by a provider this am, and a decision would be made then. Pt verbalized understanding and agreement. Informed pt to notify staff with any needs or concerns. Will continue to monitor for safety.

## 2022-03-09 NOTE — Discharge Instructions (Addendum)
Good morning, Mrs. Oviedo!  Thank you for allowing [place] to serve you these past few days.  It is imperative that you follow through with treatment recommendations within 5-7 days from the day of discharge from here.  This will mean making a follow-up appointment with your current mental health provider, [facility], to schedule an appointment for therapy to supplement medication management. A referral fo CD-IOP with [facility] has been made for you.  You should be receiving a call either today or tomorrow to set up an appointment for the assessment and get you started in the program.  Your case worker, Jamison Oka at [employer] has been notified as well since she will be your source of transportation.  [facility] [address] [place], [place], [address] [phone] phone  Your CD-IOP facilitator will be Maeola Nick, [facility].  CD-IOP will take place on the SECOND FLOOR of the building.  Go to the elevator straight ahead from the front entrance.  DO NOT GO TO THE FRONT DESK ON THE FIRST FLOOR.

## 2022-03-09 NOTE — BH Assessment (Signed)
LCSW Progress Note   0946 - LCSW spoke with Jamison Oka @ [address] [phone], regarding pt's presenting issue and needs for care.  LCSW made referral to Maeola Cletis, [facility], for CD-IOP.  CD-IOP will be in-person which is something that Ms. Elton Sin had requested given the pt's minimal motivation to fully invest in her treatment.  LCSW awaiting appointment date and time for the initial assessment.   Hansel Starling, MSW ,LCSW [facility] [phone] phone

## 2022-03-09 NOTE — ED Notes (Addendum)
Call from Mr. Thorton requesting wife [name] to call him at home [phone]. [name] is currently asleep will notifiy to call when she awakens.

## 2022-03-09 NOTE — ED Notes (Signed)
Pt is requesting assistance with getting home after discharge. Pt's husband called stating they require the facility assistance with getting the pt home. NP made aware.

## 2022-05-17 ENCOUNTER — Encounter: Payer: Self-pay | Admitting: *Deleted

## 2022-07-12 ENCOUNTER — Telehealth: Payer: Self-pay | Admitting: Physician Assistant

## 2022-07-12 DIAGNOSIS — J208 Acute bronchitis due to other specified organisms: Secondary | ICD-10-CM

## 2022-07-12 MED ORDER — PREDNISONE 10 MG (21) PO TBPK: ORAL_TABLET | ORAL | 0 refills | Status: DC

## 2022-07-12 MED ORDER — ALBUTEROL SULFATE HFA 108 (90 BASE) MCG/ACT IN AERS: 1.0000 | INHALATION_SPRAY | Freq: Three times a day (TID) | RESPIRATORY_TRACT | 0 refills | Status: DC | PRN

## 2022-07-12 NOTE — Progress Notes (Signed)
We are sorry that you are not feeling well.  Here is how we plan to help!  Based on your presentation I believe you most likely have A cough due to a virus.  This is called viral bronchitis and is best treated by rest, plenty of fluids and control of the cough.  You may use Ibuprofen or Tylenol as directed to help your symptoms.     In addition you may use A non-prescription cough medication called Mucinex DM: take 2 tablets every 12 hours.  Prednisone 10 mg daily for 6 days (see taper instructions below)  Directions for 6 day taper: Day 1: 2 tablets before breakfast, 1 after both lunch & dinner and 2 at bedtime Day 2: 1 tab before breakfast, 1 after both lunch & dinner and 2 at bedtime Day 3: 1 tab at each meal & 1 at bedtime Day 4: 1 tab at breakfast, 1 at lunch, 1 at bedtime Day 5: 1 tab at breakfast & 1 tab at bedtime Day 6: 1 tab at breakfast  Albuterol Use 1-2 puffs every 6-8 hours as needed for wheezing and shortness of breath  From your responses in the eVisit questionnaire you describe inflammation in the upper respiratory tract which is causing a significant cough.  This is commonly called Bronchitis and has four common causes:   Allergies Viral Infections Acid Reflux Bacterial Infection Allergies, viruses and acid reflux are treated by controlling symptoms or eliminating the cause. An example might be a cough caused by taking certain blood pressure medications. You stop the cough by changing the medication. Another example might be a cough caused by acid reflux. Controlling the reflux helps control the cough.  USE OF BRONCHODILATOR ("RESCUE") INHALERS: There is a risk from using your bronchodilator too frequently.  The risk is that over-reliance on a medication which only relaxes the muscles surrounding the breathing tubes can reduce the effectiveness of medications prescribed to reduce swelling and congestion of the tubes themselves.  Although you feel brief relief from the  bronchodilator inhaler, your asthma may actually be worsening with the tubes becoming more swollen and filled with mucus.  This can delay other crucial treatments, such as oral steroid medications. If you need to use a bronchodilator inhaler daily, several times per day, you should discuss this with your provider.  There are probably better treatments that could be used to keep your asthma under control.     HOME CARE Only take medications as instructed by your medical team. Complete the entire course of an antibiotic. Drink plenty of fluids and get plenty of rest. Avoid close contacts especially the very young and the elderly Cover your mouth if you cough or cough into your sleeve. Always remember to wash your hands A steam or ultrasonic humidifier can help congestion.   GET HELP RIGHT AWAY IF: You develop worsening fever. You become short of breath You cough up blood. Your symptoms persist after you have completed your treatment plan MAKE SURE YOU  Understand these instructions. Will watch your condition. Will get help right away if you are not doing well or get worse.    Thank you for choosing an e-visit.  Your e-visit answers were reviewed by a board certified advanced clinical practitioner to complete your personal care plan. Depending upon the condition, your plan could have included both over the counter or prescription medications.  Please review your pharmacy choice. Make sure the pharmacy is open so you can pick up prescription now. If there  is a problem, you may contact your provider through [employer] and have the prescription routed to another pharmacy.  Your safety is important to [place]. If you have drug allergies check your prescription carefully.   For the next 24 hours you can use MyChart to ask questions about today's visit, request a non-urgent call back, or ask for a work or school excuse. You will get an email in the next two days asking about your experience. I  hope that your e-visit has been valuable and will speed your recovery.  I have spent 5 minutes in review of e-visit questionnaire, review and updating patient chart, medical decision making and response to patient.   Margaretann Loveless, PA-C

## 2022-07-27 ENCOUNTER — Telehealth: Payer: Self-pay | Admitting: Family Medicine

## 2022-07-27 DIAGNOSIS — R051 Acute cough: Secondary | ICD-10-CM

## 2022-07-27 NOTE — Progress Notes (Signed)
[place] Because recurrent symptoms that occurred within a month of last bronchitis- post treatment, I feel your condition warrants further evaluation and I recommend that you be seen in a face to face visit.   NOTE: There will be NO CHARGE for this eVisit

## 2022-08-16 ENCOUNTER — Telehealth: Payer: Self-pay | Admitting: Nurse Practitioner

## 2022-08-16 DIAGNOSIS — N3000 Acute cystitis without hematuria: Secondary | ICD-10-CM

## 2022-08-16 MED ORDER — NITROFURANTOIN MONOHYD MACRO 100 MG PO CAPS: 100.0000 mg | ORAL_CAPSULE | Freq: Two times a day (BID) | ORAL | 0 refills | Status: AC

## 2022-08-16 NOTE — Progress Notes (Signed)

## 2022-09-20 ENCOUNTER — Telehealth: Payer: Self-pay | Admitting: Physician Assistant

## 2022-09-20 DIAGNOSIS — R3989 Other symptoms and signs involving the genitourinary system: Secondary | ICD-10-CM

## 2022-09-20 MED ORDER — SULFAMETHOXAZOLE-TRIMETHOPRIM 800-160 MG PO TABS: 1.0000 | ORAL_TABLET | Freq: Two times a day (BID) | ORAL | 0 refills | Status: DC

## 2022-09-20 NOTE — Progress Notes (Signed)
E-Visit for Urinary Problems  We are sorry that you are not feeling well.  Here is how we plan to help!  Based on what you shared with me it looks like you most likely have a simple urinary tract infection.  A UTI (Urinary Tract Infection) is a bacterial infection of the bladder.  Most cases of urinary tract infections are simple to treat but a key part of your care is to encourage you to drink plenty of fluids and watch your symptoms carefully.  I have prescribed Bactrim DS One tablet twice a day for 5 days.  Your symptoms should gradually improve. Call us if the burning in your urine worsens, you develop worsening fever, back pain or pelvic pain or if your symptoms do not resolve after completing the antibiotic.  Urinary tract infections can be prevented by drinking plenty of water to keep your body hydrated.  Also be sure when you wipe, wipe from front to back and don't hold it in!  If possible, empty your bladder every 4 hours.  HOME CARE Drink plenty of fluids Compete the full course of the antibiotics even if the symptoms resolve Remember, when you need to go.go. Holding in your urine can increase the likelihood of getting a UTI! GET HELP RIGHT AWAY IF: You cannot urinate You get a high fever Worsening back pain occurs You see blood in your urine You feel sick to your stomach or throw up You feel like you are going to pass out  MAKE SURE YOU  Understand these instructions. Will watch your condition. Will get help right away if you are not doing well or get worse.   Thank you for choosing an e-visit.  Your e-visit answers were reviewed by a board certified advanced clinical practitioner to complete your personal care plan. Depending upon the condition, your plan could have included both over the counter or prescription medications.  Please review your pharmacy choice. Make sure the pharmacy is open so you can pick up prescription now. If there is a problem, you may contact  your provider through [employer] and have the prescription routed to another pharmacy.  Your safety is important to [place]. If you have drug allergies check your prescription carefully.   For the next 24 hours you can use MyChart to ask questions about today's visit, request a non-urgent call back, or ask for a work or school excuse. You will get an email in the next two days asking about your experience. I hope that your e-visit has been valuable and will speed your recovery.  I have spent 5 minutes in review of e-visit questionnaire, review and updating patient chart, medical decision making and response to patient.   Mar Daring, PA-C

## 2022-12-09 ENCOUNTER — Telehealth: Payer: Medicaid Other | Admitting: Physician Assistant

## 2022-12-09 DIAGNOSIS — R051 Acute cough: Secondary | ICD-10-CM

## 2022-12-09 DIAGNOSIS — J029 Acute pharyngitis, unspecified: Secondary | ICD-10-CM | POA: Diagnosis not present

## 2022-12-09 NOTE — Progress Notes (Signed)
E-Visit for Sore Throat  We are sorry that you are not feeling well.  Here is how we plan to help!  Your symptoms indicate a likely viral infection (Pharyngitis).   Pharyngitis is inflammation in the back of the throat which can cause a sore throat, scratchiness and sometimes difficulty swallowing.   Pharyngitis is typically caused by a respiratory virus and will just run its course.  Please keep in mind that your symptoms could last up to 10 days.  For throat pain, we recommend over the counter oral pain relief medications such as acetaminophen or aspirin, or anti-inflammatory medications such as ibuprofen or naproxen sodium.  Topical treatments such as oral throat lozenges or sprays may be used as needed.  Avoid close contact with loved ones, especially the very young and elderly.  Remember to wash your hands thoroughly throughout the day as this is the number one way to prevent the spread of infection and wipe down door knobs and counters with disinfectant.  After careful review of your answers, I would not recommend an antibiotic for your condition.  Antibiotics should not be used to treat conditions that we suspect are caused by viruses like the virus that causes the common cold or flu. However, some people can have Strep with atypical symptoms. You may need formal testing in clinic or office to confirm if your symptoms continue or worsen.  I also recommend over the counter Delsym for your cough.  I recommend Mucinex and Flonase as well.  If no improvement you can follow up for in person evaluation.   Providers prescribe antibiotics to treat infections caused by bacteria. Antibiotics are very powerful in treating bacterial infections when they are used properly.  To maintain their effectiveness, they should be used only when necessary.  Overuse of antibiotics has resulted in the development of super bugs that are resistant to treatment!    Home Care: Only take medications as instructed by your  medical team. Do not drink alcohol while taking these medications. A steam or ultrasonic humidifier can help congestion.  You can place a towel over your head and breathe in the steam from hot water coming from a faucet. Avoid close contacts especially the very young and the elderly. Cover your mouth when you cough or sneeze. Always remember to wash your hands.  Get Help Right Away If: You develop worsening fever or throat pain. You develop a severe head ache or visual changes. Your symptoms persist after you have completed your treatment plan.  Make sure you Understand these instructions. Will watch your condition. Will get help right away if you are not doing well or get worse.   Thank you for choosing an e-visit.  Your e-visit answers were reviewed by a board certified advanced clinical practitioner to complete your personal care plan. Depending upon the condition, your plan could have included both over the counter or prescription medications.  Please review your pharmacy choice. Make sure the pharmacy is open so you can pick up prescription now. If there is a problem, you may contact your provider through [employer] and have the prescription routed to another pharmacy.  Your safety is important to [place]. If you have drug allergies check your prescription carefully.   For the next 24 hours you can use MyChart to ask questions about today's visit, request a non-urgent call back, or ask for a work or school excuse. You will get an email in the next two days asking about your experience. I hope  that your e-visit has been valuable and will speed your recovery.   I have spent 5 minutes in review of e-visit questionnaire, review and updating patient chart, medical decision making and response to patient.   Tylene Fantasia Ward, PA-C

## 2023-01-09 ENCOUNTER — Other Ambulatory Visit: Payer: Self-pay

## 2023-01-09 ENCOUNTER — Emergency Department (HOSPITAL_COMMUNITY): Payer: Medicaid Other

## 2023-01-09 ENCOUNTER — Encounter (HOSPITAL_COMMUNITY): Payer: Self-pay | Admitting: *Deleted

## 2023-01-09 ENCOUNTER — Emergency Department (HOSPITAL_COMMUNITY)
Admission: EM | Admit: 2023-01-09 | Discharge: 2023-01-09 | Disposition: A | Discharge status: Home / Self Care | Payer: Medicaid Other | Attending: Emergency Medicine | Admitting: Emergency Medicine

## 2023-01-09 DIAGNOSIS — S00512A Abrasion of oral cavity, initial encounter: Secondary | ICD-10-CM | POA: Diagnosis not present

## 2023-01-09 DIAGNOSIS — R002 Palpitations: Secondary | ICD-10-CM | POA: Diagnosis not present

## 2023-01-09 DIAGNOSIS — F419 Anxiety disorder, unspecified: Secondary | ICD-10-CM | POA: Insufficient documentation

## 2023-01-09 DIAGNOSIS — Z91040 Latex allergy status: Secondary | ICD-10-CM | POA: Diagnosis not present

## 2023-01-09 DIAGNOSIS — X58XXXA Exposure to other specified factors, initial encounter: Secondary | ICD-10-CM | POA: Diagnosis not present

## 2023-01-09 DIAGNOSIS — R569 Unspecified convulsions: Secondary | ICD-10-CM | POA: Insufficient documentation

## 2023-01-09 DIAGNOSIS — K146 Glossodynia: Secondary | ICD-10-CM | POA: Diagnosis present

## 2023-01-09 LAB — I-STAT BETA HCG BLOOD, ED (MC, WL, AP ONLY): I-stat hCG, quantitative: <5 m[IU]/mL (ref <5)

## 2023-01-09 LAB — CBC
HCT: 42.3 % (ref 36.0–46.0)
Hemoglobin: 13.4 g/dL (ref 12.0–15.0)
MCH: 26.5 pg (ref 26.0–34.0)
MCHC: 31.7 g/dL (ref 30.0–36.0)
MCV: 83.6 fL (ref 80.0–100.0)
Platelets: 246 10*3/uL (ref 150–400)
RBC: 5.06 MIL/uL (ref 3.87–5.11)
RDW: 15.6 % — ABNORMAL HIGH (ref 11.5–15.5)
WBC: 7.2 10*3/uL (ref 4.0–10.5)
nRBC: 0 % (ref 0.0–0.2)

## 2023-01-09 LAB — BASIC METABOLIC PANEL
Anion gap: 13 (ref 5–15)
BUN: 11 mg/dL (ref 6–20)
CO2: 21 mmol/L — ABNORMAL LOW (ref 22–32)
Calcium: 8.9 mg/dL (ref 8.9–10.3)
Chloride: 103 mmol/L (ref 98–111)
Creatinine, Ser: 0.68 mg/dL (ref 0.44–1.00)
GFR, Estimated: >60 mL/min (ref >60)
Glucose, Bld: 116 mg/dL — ABNORMAL HIGH (ref 70–99)
Potassium: 4.1 mmol/L (ref 3.5–5.1)
Sodium: 137 mmol/L (ref 135–145)

## 2023-01-09 LAB — TROPONIN I (HIGH SENSITIVITY): Troponin I (High Sensitivity): 3 ng/L (ref <18)

## 2023-01-09 NOTE — ED Triage Notes (Signed)
Pt here via [facility] from [facility] on [address] in [place]. Pt states she began experiencing chest pain and palpitations, like a panic attack.  Her staff called 911 and gave her a 325 mg asa to chew. Then gave her:  Hydroxazine Clonidine Keppra Naltrexone   - her daily meds around 13:15 approx  Pt now feels calm.  States she may have had a seizure, b/c she feels post ictal - headache, tired and feels as if she bit the L side of her tongue.  Staff did not visualize one.

## 2023-01-09 NOTE — ED Provider Notes (Signed)
[place] EMERGENCY DEPARTMENT AT [facility] Provider Note   CSN: [id number] Arrival date & time: 01/09/23  1359     History  Chief Complaint  Patient presents with   Palpitations   post-ictal    Jacqueline Jones is a 32 y.o. female.  HPI    32 year old female comes in with chief complaint of chest discomfort, palpitations.  Patient currently resides at [facility] facility.  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  Patient states that she has history of seizures for which she is on Keppra 1500 mg twice daily.  Her last seizure was 2 months ago.  Today she had sudden onset of chest tightness followed by shortness of breath.  She does not recall what happened there after.  She is having discomfort over her tongue.  She denies incontinence.  She suspects that she might have had a seizure as she is having headaches that are consistent with her seizures.  She was told by staff members at the facility that nobody witnessed seizure-like activity.  Patient does not want any Keppra in the ER.  She states that she feels now back to being baseline besides the tongue pain and wants to go home.   Home Medications Prior to Admission medications   Medication Sig Start Date End Date Taking? Authorizing Provider  albuterol (VENTOLIN HFA) 108 (90 Base) MCG/ACT inhaler Inhale 1 puff into the lungs every 8 (eight) hours as needed for wheezing or shortness of breath. 07/12/22   Margaretann Loveless, PA-C  escitalopram (LEXAPRO) 10 MG tablet Take 10 mg by mouth every morning. 02/20/22   [provider]  hydrOXYzine (ATARAX) 50 MG tablet Take 50 mg by mouth 4 (four) times daily as needed for anxiety. 03/01/22   [provider]  lamoTRIgine (LAMICTAL) 200 MG tablet Take 200-300 mg by mouth 2 (two) times daily. 10/29/20   [provider]  levETIRAcetam (KEPPRA) 1000 MG tablet Take 1,000-1,500 mg by mouth 2 (two) times daily. Take one tablet in the  morning and one and a half tablets in the evening. 12/03/20   [provider]  lurasidone (LATUDA) 40 MG TABS tablet Take 40 mg by mouth daily. 12/24/21   [provider]  pantoprazole (PROTONIX) 40 MG tablet Take 1 tablet (40 mg total) by mouth daily. 01/03/20   Meredith Pel, NP  predniSONE (STERAPRED UNI-PAK 21 TAB) 10 MG (21) TBPK tablet 6 day taper; take as directed on package instructions 07/12/22   Margaretann Loveless, PA-C  sulfamethoxazole-trimethoprim (BACTRIM DS) 800-160 MG tablet Take 1 tablet by mouth 2 (two) times daily. 09/20/22   Margaretann Loveless, PA-C      Allergies    Dilaudid [hydromorphone], Toradol [ketorolac tromethamine], Clonazepam, Flexeril [cyclobenzaprine], Gabapentin, Tramadol, Vicodin [hydrocodone-acetaminophen], Adhesive [tape], Azithromycin, Benzonatate, Keflet [cephalexin], Latex, and Levaquin [levofloxacin in d5w]    Review of Systems   Review of Systems  All other systems reviewed and are negative.   Physical Exam Updated Vital Signs BP 99/85 (BP Location: Right Arm)   Pulse 76   Temp 98.2 F (36.8 C) (Oral)   Resp 14   Ht 5' (1.524 m)   Wt 107 kg   LMP 01/08/2023   SpO2 100%   BMI 46.09 kg/m  Physical Exam Vitals and nursing note reviewed.  Constitutional:      Appearance: She is well-developed.  HENT:     Head: Atraumatic.     Mouth/Throat:     Comments:  Superficial abrasion to the L lateral toungue Eyes:     Extraocular Movements: Extraocular movements intact.     Pupils: Pupils are equal, round, and reactive to light.  Cardiovascular:     Rate and Rhythm: Normal rate.  Pulmonary:     Effort: Pulmonary effort is normal.  Musculoskeletal:     Cervical back: Normal range of motion and neck supple.  Skin:    General: Skin is warm and dry.  Neurological:     Mental Status: She is alert and oriented to person, place, and time.     ED Results / Procedures / Treatments   Labs (all labs ordered are listed, but  only abnormal results are displayed) Labs Reviewed  BASIC METABOLIC PANEL - Abnormal; Notable for the following components:      Result Value   CO2 21 (*)    Glucose, Bld 116 (*)    All other components within normal limits  CBC - Abnormal; Notable for the following components:   RDW 15.6 (*)    All other components within normal limits  I-STAT BETA HCG BLOOD, ED ([facility], [facility], [facility] ONLY)  TROPONIN I (HIGH SENSITIVITY)  TROPONIN I (HIGH SENSITIVITY)    EKG EKG Interpretation  Date/Time:  Sunday January 09 2023 13:56:41 EDT Ventricular Rate:  85 PR Interval:  148 QRS Duration: 76 QT Interval:  398 QTC Calculation: 473 R Axis:   20 Text Interpretation: Normal sinus rhythm Nonspecific T wave abnormality Abnormal ECG When compared with ECG of 17-Feb-2022 17:39, PREVIOUS ECG IS PRESENT No significant change since last tracing Confirmed by Derwood Kaplan [phone]) on 01/09/2023 6:33:28 PM  Radiology DG Chest 2 View  Result Date: 01/09/2023 CLINICAL DATA:  Chest pain EXAM: CHEST - 2 VIEW COMPARISON:  03/22/2020 FINDINGS: The heart size and mediastinal contours are within normal limits. Both lungs are clear. The visualized skeletal structures are unremarkable. IMPRESSION: No active cardiopulmonary disease. Electronically Signed   By: Duanne Guess D.O.   On: 01/09/2023 15:25    Procedures Procedures    Medications Ordered in ED Medications - No data to display  ED Course/ Medical Decision Making/ A&P                             Medical Decision Making Amount and/or Complexity of Data Reviewed Labs: ordered. Radiology: ordered.   Patient comes in with chief complaint of chest pain, shortness of breath.  She has history of seizure disorder for which she is on Keppra, and general anxiety disorder.  She is currently at [facility] rehab facility.  Her last alcohol use was 3 months ago.  Her last narcotic pill use was 2 weeks ago.  She suspects that she might have had a seizure, she is having  tongue discomfort/pain and also headache.  She has had seizures in the past with similar postictal phase.  Last seizure was 2 months ago.  Nobody at [facility] noticed that patient had seizures.  Patient states that she does not remember everything.  She denies any chest pain, palpitations. She has tongue abrasions - so likely seized.  Differential includes seizures, withdrawals, panic attack, e'lyte abnormality.   Labs ordered and independently interpreted - they are reassuring.  Pt doesn't want keppra load. She is feeling better and wants to leave. She is aware that there is a higher predisposition to having a repeat seizure within the first 6 hours, that I recommend that she get IV Keppra.  Patient feels better and just prefers leaving now. Stable for discharge.  Final Clinical Impression(s) / ED Diagnoses Final diagnoses:  Seizure [facility])  Anxiety    Rx / DC Orders ED Discharge Orders     None         Derwood Kaplan, MD 01/09/23 Paulo Fruit

## 2023-01-09 NOTE — Discharge Instructions (Signed)
You were seen in the ER what sounds like seizure like activity. The workup in the emergency room is reassuring.  You have declined Keppra or additional observation.  Please ensure you are taking your medications as prescribed. Return to the ER if there is repeat seizure.

## 2023-01-09 NOTE — ED Provider Triage Note (Signed)
Emergency Medicine Provider Triage Evaluation Note  Jacqueline Jones , a 31 y.o. female  was evaluated in triage.  Pt complains of having some chest discomfort and palpitations.  Pt reports she felt like she was having a panic attack.  Pt reports she thinks she may have had a seizure.  Pt reports pain in her tongue.  Pt reports facility staff told her they did not see her have any seizure like activity  Pt is at [facility] and is taking all her medicaitons   Review of Systems  Positive: Tongue soreness, tired Negative: Fever or chills   Physical Exam  BP 133/82 (BP Location: Right Arm)   Pulse 74   Temp 98.1 F (36.7 C) (Oral)   Resp 18   Ht 5' (1.524 m)   Wt 107 kg   LMP 01/08/2023   SpO2 100%   BMI 46.09 kg/m  Gen:   Awake, no distress   Resp:  Normal effort  MSK:   Moves extremities without difficulty  Other:    Medical Decision Making  Medically screening exam initiated at 2:17 PM.  Appropriate orders placed.  DANITY CHLADEK was informed that the remainder of the evaluation will be completed by another provider, this initial triage assessment does not replace that evaluation, and the importance of remaining in the ED until their evaluation is complete.     Elson Areas, [place] 01/09/23 1420

## 2023-02-27 ENCOUNTER — Telehealth: Payer: Self-pay | Admitting: Nurse Practitioner

## 2023-02-27 DIAGNOSIS — J069 Acute upper respiratory infection, unspecified: Secondary | ICD-10-CM

## 2023-02-27 MED ORDER — FLUTICASONE PROPIONATE 50 MCG/ACT NA SUSP: 2.0000 | Freq: Every day | NASAL | 0 refills | Status: DC

## 2023-02-27 NOTE — Progress Notes (Signed)
I have spent 5 minutes in review of e-visit questionnaire, review and updating patient chart, medical decision making and response to patient.  ° °Talvin Christianson W Nyomie Ehrlich, NP ° °  °

## 2023-02-27 NOTE — Progress Notes (Signed)
You would need to take an over the counter flu test (can be purchased at [employer]) to qualify for tamiflu as well as a COVID test to make sure you are not covid positive.   Providers prescribe antibiotics to treat infections caused by bacteria. Antibiotics are very powerful in treating bacterial infections when they are used properly. To maintain their effectiveness, they should be used only when necessary. Overuse of antibiotics has resulted in the development of superbugs that are resistant to treatment!    After careful review of your answers, I would not recommend an antibiotic for your condition.  Antibiotics are not effective against viruses and therefore should not be used to treat them. Common examples of infections caused by viruses include colds and flu   Please feel free to reach out to [place] in 4-5 days if your symptoms have not improved.  You can take tylenol and motrin every 4-6 hours for pain relief.  Warm salt water gargles or tea with honey will also help soothe the throat.  I have sent a nose spray for congestion.      E-Visit for Sore Throat  We are sorry that you are not feeling well.  Here is how we plan to help!  Your symptoms indicate a likely viral infection (Pharyngitis).   Pharyngitis is inflammation in the back of the throat which can cause a sore throat, scratchiness and sometimes difficulty swallowing.   Pharyngitis is typically caused by a respiratory virus and will just run its course.  Please keep in mind that your symptoms could last up to 10 days.  For throat pain, we recommend over the counter oral pain relief medications such as acetaminophen or aspirin, or anti-inflammatory medications such as ibuprofen or naproxen sodium.  Topical treatments such as oral throat lozenges or sprays may be used as needed.  Avoid close contact with loved ones, especially the very young and elderly.  Remember to wash your hands thoroughly throughout the day as this is the number one way  to prevent the spread of infection and wipe down door knobs and counters with disinfectant.  After careful review of your answers, I would not recommend an antibiotic for your condition.  Antibiotics should not be used to treat conditions that we suspect are caused by viruses like the virus that causes the common cold or flu. However, some people can have Strep with atypical symptoms. You may need formal testing in clinic or office to confirm if your symptoms continue or worsen.  Providers prescribe antibiotics to treat infections caused by bacteria. Antibiotics are very powerful in treating bacterial infections when they are used properly.  To maintain their effectiveness, they should be used only when necessary.  Overuse of antibiotics has resulted in the development of super bugs that are resistant to treatment!    Home Care: Only take medications as instructed by your medical team. Do not drink alcohol while taking these medications. A steam or ultrasonic humidifier can help congestion.  You can place a towel over your head and breathe in the steam from hot water coming from a faucet. Avoid close contacts especially the very young and the elderly. Cover your mouth when you cough or sneeze. Always remember to wash your hands.  Get Help Right Away If: You develop worsening fever or throat pain. You develop a severe head ache or visual changes. Your symptoms persist after you have completed your treatment plan.  Make sure you Understand these instructions. Will watch your condition. Will  get help right away if you are not doing well or get worse.   Thank you for choosing an e-visit.  Your e-visit answers were reviewed by a board certified advanced clinical practitioner to complete your personal care plan. Depending upon the condition, your plan could have included both over the counter or prescription medications.  Please review your pharmacy choice. Make sure the pharmacy is open so  you can pick up prescription now. If there is a problem, you may contact your provider through [employer] and have the prescription routed to another pharmacy.  Your safety is important to [place]. If you have drug allergies check your prescription carefully.   For the next 24 hours you can use MyChart to ask questions about today's visit, request a non-urgent call back, or ask for a work or school excuse. You will get an email in the next two days asking about your experience. I hope that your e-visit has been valuable and will speed your recovery.

## 2023-03-15 ENCOUNTER — Telehealth: Payer: MEDICAID | Admitting: Family Medicine

## 2023-03-15 DIAGNOSIS — L539 Erythematous condition, unspecified: Secondary | ICD-10-CM

## 2023-03-15 DIAGNOSIS — W5501XA Bitten by cat, initial encounter: Secondary | ICD-10-CM

## 2023-03-15 NOTE — Progress Notes (Signed)
Because you have had a puncture wound, you need to be seen in person to have it washed out well and started on medications. Your condition warrants further evaluation and I recommend that you be seen in a face to face visit.   NOTE: There will be NO CHARGE for this eVisit   If you are having a true medical emergency please call 911.

## 2023-05-04 ENCOUNTER — Telehealth: Payer: MEDICAID | Admitting: Family Medicine

## 2023-05-04 DIAGNOSIS — M549 Dorsalgia, unspecified: Secondary | ICD-10-CM

## 2023-05-04 NOTE — Progress Notes (Signed)
Because you have several allergies and that includes Flexeril, your condition warrants further evaluation and I recommend that you be seen in a face to face visit.   NOTE: There will be NO CHARGE for this eVisit   If you are having a true medical emergency please call 911.

## 2023-05-05 ENCOUNTER — Telehealth: Payer: MEDICAID | Admitting: Physician Assistant

## 2023-05-05 DIAGNOSIS — M545 Low back pain, unspecified: Secondary | ICD-10-CM

## 2023-05-05 NOTE — Progress Notes (Signed)
Due to things noted on e-visit submission yesterday, we will still require you to have a face-to-face evaluation in order to receive a muscle relaxant giving the documented side effects to this class of medication  NOTE: There will be NO CHARGE for this eVisit   If you are having a true medical emergency please call 911.      For an urgent face to face visit, [place] has eight urgent care centers for your convenience:   NEW!! [facility] Health Urgent Care Center at [facility] Get Driving Directions [phone] [address] [place], [address]    [facility] Health Urgent Care Center at [facility] Get Driving Directions [phone] [address] [place], [place] [address]   [facility] Health Urgent Care Center [facility]) Get Driving Directions [phone] [address] [place], [place] [address]  [facility] Health Urgent Care Center [facility]) Get Driving Directions [phone] [address] [place],  [place]  [address]  [facility] Health Urgent Care Center [facility] - at [employer]  [phone] [address] [place],  [place] [address]   [facility] Health Urgent Care at [facility] Get Driving Directions [phone] [address] [place], [place] [address]   [facility] Health Urgent Care at [facility] Get Driving Directions  [phone] [address] [place], [place] [address]   [facility] Health Urgent Care at [facility] Directions [phone] [address], [address] [place], [place] [address]  Your MyChart E-visit questionnaire answers were reviewed by a board certified advanced clinical practitioner to complete your personal care plan based on your specific symptoms.  Thank you for using e-Visits.

## 2023-05-24 ENCOUNTER — Telehealth: Payer: MEDICAID | Admitting: Physician Assistant

## 2023-05-24 DIAGNOSIS — R3989 Other symptoms and signs involving the genitourinary system: Secondary | ICD-10-CM | POA: Diagnosis not present

## 2023-05-24 MED ORDER — NITROFURANTOIN MONOHYD MACRO 100 MG PO CAPS: 100.0000 mg | ORAL_CAPSULE | Freq: Two times a day (BID) | ORAL | 0 refills | Status: DC

## 2023-05-24 NOTE — Progress Notes (Signed)
I have spent 5 minutes in review of e-visit questionnaire, review and updating patient chart, medical decision making and response to patient.   Mia Milan Cody Jacklynn Dehaas, PA-C    

## 2023-05-24 NOTE — Progress Notes (Signed)
E-Visit for Urinary Problems ? ?We are sorry that you are not feeling well.  Here is how we plan to help! ? ?Based on what you shared with me it looks like you most likely have a simple urinary tract infection. ? ?A UTI (Urinary Tract Infection) is a bacterial infection of the bladder. ? ?Most cases of urinary tract infections are simple to treat but a key part of your care is to encourage you to drink plenty of fluids and watch your symptoms carefully. ? ?I have prescribed MacroBid 100 mg twice a day for 5 days.  Your symptoms should gradually improve. Call us if the burning in your urine worsens, you develop worsening fever, back pain or pelvic pain or if your symptoms do not resolve after completing the antibiotic. ? ?Urinary tract infections can be prevented by drinking plenty of water to keep your body hydrated.  Also be sure when you wipe, wipe from front to back and don't hold it in!  If possible, empty your bladder every 4 hours. ? ?HOME CARE ?Drink plenty of fluids ?Compete the full course of the antibiotics even if the symptoms resolve ?Remember, when you need to go?go. Holding in your urine can increase the likelihood of getting a UTI! ?GET HELP RIGHT AWAY IF: ?You cannot urinate ?You get a high fever ?Worsening back pain occurs ?You see blood in your urine ?You feel sick to your stomach or throw up ?You feel like you are going to pass out ? ?MAKE SURE YOU  ?Understand these instructions. ?Will watch your condition. ?Will get help right away if you are not doing well or get worse. ? ? ?Thank you for choosing an e-visit. ? ?Your e-visit answers were reviewed by a board certified advanced clinical practitioner to complete your personal care plan. Depending upon the condition, your plan could have included both over the counter or prescription medications. ? ?Please review your pharmacy choice. Make sure the pharmacy is open so you can pick up prescription now. If there is a problem, you may contact your  provider through MyChart messaging and have the prescription routed to another pharmacy.  Your safety is important to us. If you have drug allergies check your prescription carefully.  ? ?For the next 24 hours you can use MyChart to ask questions about today's visit, request a non-urgent call back, or ask for a work or school excuse. ?You will get an email in the next two days asking about your experience. I hope that your e-visit has been valuable and will speed your recovery.  ?

## 2023-06-25 ENCOUNTER — Telehealth: Payer: MEDICAID | Admitting: Family Medicine

## 2023-06-25 DIAGNOSIS — R059 Cough, unspecified: Secondary | ICD-10-CM | POA: Diagnosis not present

## 2023-06-25 MED ORDER — DM-GUAIFENESIN ER 30-600 MG PO TB12: 1.0000 | ORAL_TABLET | Freq: Two times a day (BID) | ORAL | 0 refills | Status: AC

## 2023-06-25 MED ORDER — PREDNISONE 10 MG (21) PO TBPK: ORAL_TABLET | ORAL | 0 refills | Status: DC

## 2023-06-25 NOTE — Progress Notes (Signed)
Patient replied that they have taken Prednisone.  Will send a Prednisone taper.

## 2023-06-25 NOTE — Addendum Note (Signed)
Addended byReed Pandy on: 06/25/2023 01:47 PM   Modules accepted: Orders

## 2023-06-25 NOTE — Progress Notes (Signed)
E-Visit for Cough   We are sorry that you are not feeling well.  Here is how we plan to help!  Based on your presentation I believe you most likely have A cough due to a virus.  This is called viral bronchitis and is best treated by rest, plenty of fluids and control of the cough.  You may use Ibuprofen or Tylenol as directed to help your symptoms.     In addition you may use Mucinex.  We will send this to the pharmacy.   Have you taken prednisone in the past?  If so, we can send a prednisone taper, but please reply and let us know if you would like [place] to send the prednisone taper.    From your responses in the eVisit questionnaire you describe inflammation in the upper respiratory tract which is causing a significant cough.  This is commonly called Bronchitis and has four common causes:   Allergies Viral Infections Acid Reflux Bacterial Infection Allergies, viruses and acid reflux are treated by controlling symptoms or eliminating the cause. An example might be a cough caused by taking certain blood pressure medications. You stop the cough by changing the medication. Another example might be a cough caused by acid reflux. Controlling the reflux helps control the cough.  USE OF BRONCHODILATOR ("RESCUE") INHALERS: There is a risk from using your bronchodilator too frequently.  The risk is that over-reliance on a medication which only relaxes the muscles surrounding the breathing tubes can reduce the effectiveness of medications prescribed to reduce swelling and congestion of the tubes themselves.  Although you feel brief relief from the bronchodilator inhaler, your asthma may actually be worsening with the tubes becoming more swollen and filled with mucus.  This can delay other crucial treatments, such as oral steroid medications. If you need to use a bronchodilator inhaler daily, several times per day, you should discuss this with your provider.  There are probably better treatments that could be  used to keep your asthma under control.     HOME CARE Only take medications as instructed by your medical team. Complete the entire course of an antibiotic. Drink plenty of fluids and get plenty of rest. Avoid close contacts especially the very young and the elderly Cover your mouth if you cough or cough into your sleeve. Always remember to wash your hands A steam or ultrasonic humidifier can help congestion.   GET HELP RIGHT AWAY IF: You develop worsening fever. You become short of breath You cough up blood. Your symptoms persist after you have completed your treatment plan MAKE SURE YOU  Understand these instructions. Will watch your condition. Will get help right away if you are not doing well or get worse.    Thank you for choosing an e-visit.  Your e-visit answers were reviewed by a board certified advanced clinical practitioner to complete your personal care plan. Depending upon the condition, your plan could have included both over the counter or prescription medications.  Please review your pharmacy choice. Make sure the pharmacy is open so you can pick up prescription now. If there is a problem, you may contact your provider through [employer] and have the prescription routed to another pharmacy.  Your safety is important to [place]. If you have drug allergies check your prescription carefully.   For the next 24 hours you can use MyChart to ask questions about today's visit, request a non-urgent call back, or ask for a work or school excuse. You will get  an email in the next two days asking about your experience. I hope that your e-visit has been valuable and will speed your recovery.  I have spent 5 minutes in review of e-visit questionnaire, review and updating patient chart, medical decision making and response to patient.   Reed Pandy, PA-C

## 2023-07-06 ENCOUNTER — Telehealth: Payer: MEDICAID | Admitting: Physician Assistant

## 2023-07-06 DIAGNOSIS — B999 Unspecified infectious disease: Secondary | ICD-10-CM

## 2023-07-06 NOTE — Progress Notes (Signed)
Because of ongoing symptoms despite prednisone and need for examination, I feel your condition warrants further evaluation and I recommend that you be seen in a face to face visit.   NOTE: There will be NO CHARGE for this eVisit   If you are having a true medical emergency please call 911.      For an urgent face to face visit,  has eight urgent care centers for your convenience:   NEW!! [facility] at [facility] Get Driving Directions [phone] [address] [place], [address]    [facility] Ur[facility] Center at [facility] Get Driving Directions [phone] [address] [place], [place] [address]   [facility] Center [facility]) Get Driving Directions [phone] [address] [place], [place] [address]  [facility]) Get Driving Directions [phone] [address] [place],  [place]  [address]  [facility] - at [employer]  [phone] [address] [place],  [place] [address]   [facility] at [facility] Get Driving Directions [phone] [address] [place], [place] [address]   [facility] Urgent Care at [facility] Get Driving Directions  [phone] [address] [place], [place] [address]   [facility] Health Urgent Care at [facility] Directions [phone] [address] [place], [place] [address]  Your MyChart E-visit questionnaire answers were reviewed by a board certified advanced clinical practitioner to complete your personal care plan based on your specific symptoms.  Thank you for using e-Visits.

## 2023-08-11 ENCOUNTER — Telehealth: Payer: MEDICAID | Admitting: Physician Assistant

## 2023-08-11 DIAGNOSIS — J069 Acute upper respiratory infection, unspecified: Secondary | ICD-10-CM

## 2023-08-11 MED ORDER — PREDNISONE 20 MG PO TABS: 40.0000 mg | ORAL_TABLET | Freq: Every day | ORAL | 0 refills | Status: DC

## 2023-08-11 NOTE — Progress Notes (Signed)

## 2023-08-11 NOTE — Progress Notes (Signed)
I have spent 5 minutes in review of e-visit questionnaire, review and updating patient chart, medical decision making and response to patient.   Mia Milan Cody Jacklynn Dehaas, PA-C    

## 2023-08-28 ENCOUNTER — Telehealth: Payer: MEDICAID

## 2023-08-28 DIAGNOSIS — B9689 Other specified bacterial agents as the cause of diseases classified elsewhere: Secondary | ICD-10-CM

## 2023-08-28 DIAGNOSIS — J208 Acute bronchitis due to other specified organisms: Secondary | ICD-10-CM | POA: Diagnosis not present

## 2023-08-29 MED ORDER — DOXYCYCLINE HYCLATE 100 MG PO TABS: 100.0000 mg | ORAL_TABLET | Freq: Two times a day (BID) | ORAL | 0 refills | Status: DC

## 2023-08-29 MED ORDER — ALBUTEROL SULFATE HFA 108 (90 BASE) MCG/ACT IN AERS: 1.0000 | INHALATION_SPRAY | Freq: Four times a day (QID) | RESPIRATORY_TRACT | 0 refills | Status: DC | PRN

## 2023-08-29 NOTE — Progress Notes (Signed)
E-Visit for Cough   We are sorry that you are not feeling well.  Here is how we plan to help!  Based on your presentation I believe you most likely have A cough due to bacteria.  When patients have a fever and a productive cough with a change in color or increased sputum production, we are concerned about bacterial bronchitis.  If left untreated it can progress to pneumonia.  If your symptoms do not improve with your treatment plan it is important that you contact your provider.   I have prescribed Doxycycline 100 mg twice a day for 7 days     In addition you may use A non-prescription cough medication called Mucinex DM: take 2 tablets every 12 hours.  I have prescribed Albuterol inhaler Use 1-2 puffs every 6 hours as needed for shortness of breath, chest tightness, and/or wheezing.   From your responses in the eVisit questionnaire you describe inflammation in the upper respiratory tract which is causing a significant cough.  This is commonly called Bronchitis and has four common causes:   Allergies Viral Infections Acid Reflux Bacterial Infection Allergies, viruses and acid reflux are treated by controlling symptoms or eliminating the cause. An example might be a cough caused by taking certain blood pressure medications. You stop the cough by changing the medication. Another example might be a cough caused by acid reflux. Controlling the reflux helps control the cough.  USE OF BRONCHODILATOR ("RESCUE") INHALERS: There is a risk from using your bronchodilator too frequently.  The risk is that over-reliance on a medication which only relaxes the muscles surrounding the breathing tubes can reduce the effectiveness of medications prescribed to reduce swelling and congestion of the tubes themselves.  Although you feel brief relief from the bronchodilator inhaler, your asthma may actually be worsening with the tubes becoming more swollen and filled with mucus.  This can delay other crucial treatments,  such as oral steroid medications. If you need to use a bronchodilator inhaler daily, several times per day, you should discuss this with your provider.  There are probably better treatments that could be used to keep your asthma under control.     HOME CARE Only take medications as instructed by your medical team. Complete the entire course of an antibiotic. Drink plenty of fluids and get plenty of rest. Avoid close contacts especially the very young and the elderly Cover your mouth if you cough or cough into your sleeve. Always remember to wash your hands A steam or ultrasonic humidifier can help congestion.   GET HELP RIGHT AWAY IF: You develop worsening fever. You become short of breath You cough up blood. Your symptoms persist after you have completed your treatment plan MAKE SURE YOU  Understand these instructions. Will watch your condition. Will get help right away if you are not doing well or get worse.    Thank you for choosing an e-visit.  Your e-visit answers were reviewed by a board certified advanced clinical practitioner to complete your personal care plan. Depending upon the condition, your plan could have included both over the counter or prescription medications.  Please review your pharmacy choice. Make sure the pharmacy is open so you can pick up prescription now. If there is a problem, you may contact your provider through [employer] and have the prescription routed to another pharmacy.  Your safety is important to [place]. If you have drug allergies check your prescription carefully.   For the next 24 hours you can use  MyChart to ask questions about today's visit, request a non-urgent call back, or ask for a work or school excuse. You will get an email in the next two days asking about your experience. I hope that your e-visit has been valuable and will speed your recovery.   I have spent 5 minutes in review of e-visit questionnaire, review and updating patient  chart, medical decision making and response to patient.   Margaretann Loveless, PA-C

## 2023-09-15 ENCOUNTER — Telehealth: Payer: MEDICAID | Admitting: Nurse Practitioner

## 2023-09-15 DIAGNOSIS — R052 Subacute cough: Secondary | ICD-10-CM

## 2023-09-15 NOTE — Progress Notes (Signed)
Jacqueline Jones,  You have already been treated with antibiotics this month for bacterial bronchitis. We need you to be seen in person at this time.   I feel your condition warrants further evaluation and I recommend that you be seen in a face-to-face visit.   NOTE: There will be NO CHARGE for this E-Visit   If you are having a true medical emergency, please call 911.     For an urgent face to face visit, [place] has multiple urgent care centers for your convenience.  Click the link below for the full list of locations and hours, walk-in wait times, appointment scheduling options and driving directions:  Urgent Care - [place], [place], [place], [place], [place], [place]  [place]     Your MyChart E-visit questionnaire answers were reviewed by a board certified advanced clinical practitioner to complete your personal care plan based on your specific symptoms.    Thank you for using e-Visits.

## 2023-09-24 ENCOUNTER — Telehealth: Payer: MEDICAID | Admitting: Family Medicine

## 2023-09-24 DIAGNOSIS — K047 Periapical abscess without sinus: Secondary | ICD-10-CM | POA: Diagnosis not present

## 2023-09-24 MED ORDER — CLINDAMYCIN HCL 300 MG PO CAPS: 300.0000 mg | ORAL_CAPSULE | Freq: Three times a day (TID) | ORAL | 0 refills | Status: AC

## 2023-09-24 NOTE — Progress Notes (Signed)

## 2023-10-19 ENCOUNTER — Other Ambulatory Visit: Payer: Self-pay | Admitting: Surgery

## 2023-10-19 DIAGNOSIS — K439 Ventral hernia without obstruction or gangrene: Secondary | ICD-10-CM

## 2023-10-30 ENCOUNTER — Telehealth: Payer: MEDICAID | Admitting: Physician Assistant

## 2023-10-30 ENCOUNTER — Encounter: Payer: Self-pay | Admitting: Physician Assistant

## 2023-10-30 DIAGNOSIS — M545 Low back pain, unspecified: Secondary | ICD-10-CM

## 2023-10-30 MED ORDER — BACLOFEN 10 MG PO TABS: 10.0000 mg | ORAL_TABLET | Freq: Three times a day (TID) | ORAL | 0 refills | Status: DC

## 2023-10-30 NOTE — Progress Notes (Signed)
 E-Visit for Back Pain   We are sorry that you are not feeling well.  Here is how we plan to help!  Based on what you have shared with me it looks like you mostly have acute back pain.  Acute back pain is defined as musculoskeletal pain that can resolve in 1-3 weeks with conservative treatment.  I have prescribed  Baclofen 10 mg every eight hours as needed which is a muscle relaxer  and due to allergy, I recommend taking OTC pain reliever that you are able to without issue. Pain creams may be very helpful too. Some patients experience stomach irritation or in increased heartburn with anti-inflammatory drugs.  Please keep in mind that muscle relaxer's can cause fatigue and should not be taken while at work or driving.  Back pain is very common.  The pain often gets better over time.  The cause of back pain is usually not dangerous.  Most people can learn to manage their back pain on their own.  Home Care Stay active.  Start with short walks on flat ground if you can.  Try to walk farther each day. Do not sit, drive or stand in one place for more than 30 minutes.  Do not stay in bed. Do not avoid exercise or work.  Activity can help your back heal faster. Be careful when you bend or lift an object.  Bend at your knees, keep the object close to you, and do not twist. Sleep on a firm mattress.  Lie on your side, and bend your knees.  If you lie on your back, put a pillow under your knees. Only take medicines as told by your doctor. Put ice on the injured area. Put ice in a plastic bag Place a towel between your skin and the bag Leave the ice on for 15-20 minutes, 3-4 times a day for the first 2-3 days. 210 After that, you can switch between ice and heat packs. Ask your doctor about back exercises or massage. Avoid feeling anxious or stressed.  Find good ways to deal with stress, such as exercise.  Get Help Right Way If: Your pain does not go away with rest or medicine. Your pain does not go away  in 1 week. You have new problems. You do not feel well. The pain spreads into your legs. You cannot control when you poop (bowel movement) or pee (urinate) You feel sick to your stomach (nauseous) or throw up (vomit) You have belly (abdominal) pain. You feel like you may pass out (faint). If you develop a fever.  Make Sure you: Understand these instructions. Will watch your condition Will get help right away if you are not doing well or get worse.  Your e-visit answers were reviewed by a board certified advanced clinical practitioner to complete your personal care plan.  Depending on the condition, your plan could have included both over the counter or prescription medications.  If there is a problem please reply  once you have received a response from your provider.  Your safety is important to [place].  If you have drug allergies check your prescription carefully.    You can use MyChart to ask questions about today's visit, request a non-urgent call back, or ask for a work or school excuse for 24 hours related to this e-Visit. If it has been greater than 24 hours you will need to follow up with your provider, or enter a new e-Visit to address those concerns.  You will get  an e-mail in the next two days asking about your experience.  I hope that your e-visit has been valuable and will speed your recovery. Thank you for using e-visits.  I have spent 5 minutes in review of e-visit questionnaire, review and updating patient chart, medical decision making and response to patient.   Kasandra Knudsen Mayers, PA-C

## 2023-11-02 ENCOUNTER — Telehealth: Payer: MEDICAID | Admitting: Physician Assistant

## 2023-11-02 DIAGNOSIS — K047 Periapical abscess without sinus: Secondary | ICD-10-CM

## 2023-11-02 MED ORDER — CLINDAMYCIN HCL 300 MG PO CAPS: 300.0000 mg | ORAL_CAPSULE | Freq: Three times a day (TID) | ORAL | 0 refills | Status: DC

## 2023-11-02 NOTE — Progress Notes (Signed)
 E-Visit for Dental Pain  We are sorry that you are not feeling well.  Here is how we plan to help!  Based on what you have shared with me in the questionnaire, it sounds like you have a dental infection. I have prescribed  Clindamycin 300mg  3 times a day for 7 days. You can take Tylenol and Naproxen OTC. We cannot prescribe controlled medications like Tramadol so you would need to either follow-up with your PCP or at local in-person urgent care.   It is imperative that you see a dentist within 10 days of this eVisit to determine the cause of the dental pain and be sure it is adequately treated  A toothache or tooth pain is caused when the nerve in the root of a tooth or surrounding a tooth is irritated. Dental (tooth) infection, decay, injury, or loss of a tooth are the most common causes of dental pain. Pain may also occur after an extraction (tooth is pulled out). Pain sometimes originates from other areas and radiates to the jaw, thus appearing to be tooth pain.Bacteria growing inside your mouth can contribute to gum disease and dental decay, both of which can cause pain. A toothache occurs from inflammation of the central portion of the tooth called pulp. The pulp contains nerve endings that are very sensitive to pain. Inflammation to the pulp or pulpitis may be caused by dental cavities, trauma, and infection.    HOME CARE:   For toothaches: Over-the-counter pain medications such as acetaminophen or ibuprofen may be used. Take these as directed on the package while you arrange for a dental appointment. Avoid very cold or hot foods, because they may make the pain worse. You may get relief from biting on a cotton ball soaked in oil of cloves. You can get oil of cloves at most drug stores.  For jaw pain:  Aspirin may be helpful for problems in the joint of the jaw in adults. If pain happens every time you open your mouth widely, the temporomandibular joint (TMJ) may be the source of the pain.  Yawning or taking a large bite of food may worsen the pain. An appointment with your doctor or dentist will help you find the cause.     GET HELP RIGHT AWAY IF:  You have a high fever or chills If you have had a recent head or face injury and develop headache, light headedness, nausea, vomiting, or other symptoms that concern you after an injury to your face or mouth, you could have a more serious injury in addition to your dental injury. A facial rash associated with a toothache: This condition may improve with medication. Contact your doctor for them to decide what is appropriate. Any jaw pain occurring with chest pain: Although jaw pain is most commonly caused by dental disease, it is sometimes referred pain from other areas. People with heart disease, especially people who have had stents placed, people with diabetes, or those who have had heart surgery may have jaw pain as a symptom of heart attack or angina. If your jaw or tooth pain is associated with lightheadedness, sweating, or shortness of breath, you should see a doctor as soon as possible. Trouble swallowing or excessive pain or bleeding from gums: If you have a history of a weakened immune system, diabetes, or steroid use, you may be more susceptible to infections. Infections can often be more severe and extensive or caused by unusual organisms. Dental and gum infections in people with these conditions may  require more aggressive treatment. An abscess may need draining or IV antibiotics, for example.  MAKE SURE YOU   Understand these instructions. Will watch your condition. Will get help right away if you are not doing well or get worse.  Thank you for choosing an e-visit.  Your e-visit answers were reviewed by a board certified advanced clinical practitioner to complete your personal care plan. Depending upon the condition, your plan could have included both over the counter or prescription medications.  Please review your  pharmacy choice. Make sure the pharmacy is open so you can pick up prescription now. If there is a problem, you may contact your provider through [employer] and have the prescription routed to another pharmacy.  Your safety is important to [place]. If you have drug allergies check your prescription carefully.   For the next 24 hours you can use MyChart to ask questions about today's visit, request a non-urgent call back, or ask for a work or school excuse. You will get an email in the next two days asking about your experience. I hope that your e-visit has been valuable and will speed your recovery.

## 2023-11-02 NOTE — Progress Notes (Signed)
 I have spent 5 minutes in review of e-visit questionnaire, review and updating patient chart, medical decision making and response to patient.   Piedad Climes, PA-C

## 2023-11-09 ENCOUNTER — Other Ambulatory Visit: Payer: MEDICAID

## 2023-11-17 ENCOUNTER — Telehealth: Payer: MEDICAID | Admitting: Physician Assistant

## 2023-11-17 DIAGNOSIS — K047 Periapical abscess without sinus: Secondary | ICD-10-CM

## 2023-11-17 NOTE — Progress Notes (Signed)
  Because of recurring dental infection despite treatment via e-visit in the past few weeks, I feel your condition warrants further evaluation and I recommend that you be seen in person.   NOTE: There will be NO CHARGE for this E-Visit   If you are having a true medical emergency, please call 911.     For an urgent face to face visit, [place] has multiple urgent care centers for your convenience.  Click the link below for the full list of locations and hours, walk-in wait times, appointment scheduling options and driving directions:  Urgent Care - [place], [place], [place], [place], [place], [place]  [place]     Your MyChart E-visit questionnaire answers were reviewed by a board certified advanced clinical practitioner to complete your personal care plan based on your specific symptoms.    Thank you for using e-Visits.

## 2023-11-18 ENCOUNTER — Other Ambulatory Visit: Payer: MEDICAID

## 2023-11-18 ENCOUNTER — Telehealth: Payer: MEDICAID | Admitting: Physician Assistant

## 2023-11-18 DIAGNOSIS — K047 Periapical abscess without sinus: Secondary | ICD-10-CM

## 2023-11-18 NOTE — Progress Notes (Signed)
  Because of recently being treated for a dental infection and having recent dental procedure, I feel your condition warrants further evaluation and I recommend that you be seen in a face-to-face visit.  I would recommend for you to call the [facility]. They should have an after hours call line with a provider on call that should be able to assist you.    NOTE: There will be NO CHARGE for this E-Visit   If you are having a true medical emergency, please call 911.     For an urgent face to face visit, [place] has multiple urgent care centers for your convenience.  Click the link below for the full list of locations and hours, walk-in wait times, appointment scheduling options and driving directions:  Urgent Care - [place], [place], [place], [place], [place], [place]  [place]     Your MyChart E-visit questionnaire answers were reviewed by a board certified advanced clinical practitioner to complete your personal care plan based on your specific symptoms.    Thank you for using e-Visits.     I have spent 5 minutes in review of e-visit questionnaire, review and updating patient chart, medical decision making and response to patient.   Angelia Kelp, PA-C

## 2023-11-30 ENCOUNTER — Telehealth: Payer: MEDICAID | Admitting: Nurse Practitioner

## 2023-11-30 DIAGNOSIS — H9209 Otalgia, unspecified ear: Secondary | ICD-10-CM

## 2023-11-30 NOTE — Progress Notes (Signed)
 Sraah,  The ear pain you are experiencing is likely secondary to your dental issues. We advise you have an in person evaluation for this     I feel your condition warrants further evaluation and I recommend that you be seen in a face-to-face visit.   NOTE: There will be NO CHARGE for this E-Visit   If you are having a true medical emergency, please call 911.     For an urgent face to face visit, [place] has multiple urgent care centers for your convenience.  Click the link below for the full list of locations and hours, walk-in wait times, appointment scheduling options and driving directions:  Urgent Care - [place], [place], [place], [place], [place], [place]  [place]     Your MyChart E-visit questionnaire answers were reviewed by a board certified advanced clinical practitioner to complete your personal care plan based on your specific symptoms.    Thank you for using e-Visits.

## 2023-12-07 ENCOUNTER — Ambulatory Visit
Admission: RE | Admit: 2023-12-07 | Discharge: 2023-12-07 | Disposition: A | Discharge status: Home / Self Care | Payer: MEDICAID | Source: Ambulatory Visit | Attending: Surgery | Admitting: Surgery

## 2023-12-07 DIAGNOSIS — K439 Ventral hernia without obstruction or gangrene: Secondary | ICD-10-CM

## 2023-12-07 MED ORDER — IOPAMIDOL (ISOVUE-370) INJECTION 76%
80.0000 mL | Freq: Once | INTRAVENOUS | Status: AC | PRN
Start: 2023-12-07 — End: 2023-12-07
  Administered 2023-12-07: 80 mL via INTRAVENOUS

## 2023-12-12 ENCOUNTER — Ambulatory Visit: Payer: Self-pay | Admitting: Surgery

## 2023-12-12 NOTE — H&P (Signed)
 Subjective    Chief Complaint: New Problem (Periumbilical discomfort, lap chole 2022 )       History of Present Illness: Jacqueline Jones is a 33 y.o. female who is seen today as an office consultation at the request of Dr. Nathalie Baize for evaluation of New Problem (Periumbilical discomfort, lap chole 2022 ) .   This is a 33 year old female who was initially seen in June 2022 with acute onset of epigastric abdominal pain.  She was found to have acute cholecystitis.  She was admitted to the hospital.  She underwent laparoscopic cholecystectomy with intraoperative cholangiogram on 01/22/2021.  Pathology revealed chronic calculus cholecystitis.   Over the last year, the patient has noticed increasing discomfort and swelling of the above the umbilicus.  This improves slightly when supine.  She has irritable bowel syndrome has daily diarrhea.  She has not noticed a change in her bowel movements.  She has gained about 40 pounds since her surgery in 2022.  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  She does smoke about half a pack of cigarettes each day.   Review of Systems: A complete review of systems was obtained from the patient.  I have reviewed this information and discussed as appropriate with the patient.  See HPI as well for other ROS.   Review of Systems  Constitutional: Negative.   HENT: Negative.    Eyes: Negative.   Respiratory: Negative.    Cardiovascular: Negative.   Gastrointestinal:  Positive for abdominal pain and diarrhea.  Genitourinary: Negative.   Musculoskeletal:  Positive for back pain.  Skin: Negative.   Neurological: Negative.   Endo/Heme/Allergies: Negative.   Psychiatric/Behavioral: Negative.          Medical History: Past Medical History      Past Medical History:  Diagnosis Date   Anxiety     Asthma, unspecified asthma severity, unspecified whether complicated, unspecified whether persistent (HHS-HCC)     GERD  (gastroesophageal reflux disease)     Seizures (CMS/HHS-HCC)          Problem List     Patient Active Problem List  Diagnosis   Elevated LFTs   Epigastric pain   Folliculitis   GAD (generalized anxiety disorder)   Gallstones   Hypokalemia   Lower urinary tract infectious disease   Major depressive disorder, recurrent episode, severe (CMS/HHS-HCC)   Medical non-compliance   Neurologic abnormality   Non-intractable vomiting   Overdose of sedative or hypnotic   Polysubstance dependence (CMS/HHS-HCC)   Pupil asymmetry   QT prolongation   Seizure (CMS/HHS-HCC)        Past Surgical History       Past Surgical History:  Procedure Laterality Date   gallbladder removal surgery N/A          Allergies       Allergies  Allergen Reactions   Hydromorphone Other (See Comments)      Had a seizure after receiving   Clonazepam  Other (See Comments)      Seizure   Cyclobenzaprine  Other (See Comments)      seizure   Gabapentin Other (See Comments)      seizures   Hydrocodone -Acetaminophen  Itching      Pt states she can take Tylenol  without difficulty.   Adhesive Rash   Azithromycin Rash   Benzonatate Rash   Cephalexin  Rash      Did it involve swelling of the face/tongue/throat, SOB, or low BP? No Did it involve sudden  or severe rash/hives, skin peeling, or any reaction on the inside of your mouth or nose? No Did you need to seek medical attention at a hospital or doctor's office? No When did it last happen?       If all above answers are "NO", may proceed with cephalosporin use.        Medications Ordered Prior to Encounter        Current Outpatient Medications on File Prior to Visit  Medication Sig Dispense Refill   lamoTRIgine  (LAMICTAL ) 200 MG tablet Take 200 mg by mouth 2 (two) times daily       levETIRAcetam  (KEPPRA ) 1000 MG tablet Take by mouth       oxyCODONE  (ROXICODONE ) 5 MG immediate release tablet Take 1 tablet (5 mg total) by mouth every 6 (six) hours as  needed for Pain 30 tablet 0   cholestyramine (QUESTRAN) 4 gram oral powder Take 4 g by mouth 3 (three) times daily before meals Mix dose in 60-180 mL of water, milk or juice. 240 g 11    No current facility-administered medications on file prior to visit.        Family History       Family History  Problem Relation Age of Onset   High blood pressure (Hypertension) Mother     Obesity Sister          Tobacco Use History  Social History        Tobacco Use  Smoking Status Every Day   Current packs/day: 3.00   Types: Cigarettes  Smokeless Tobacco Never        Social History  Social History         Socioeconomic History   Marital status: Unknown  Tobacco Use   Smoking status: Every Day      Current packs/day: 3.00      Types: Cigarettes   Smokeless tobacco: Never  Vaping Use   Vaping status: Never Used    Social Drivers of Health      Received from [employer]    Social Network        Objective:         Vitals:    10/19/23 1349  BP: 123/84  Pulse: (!) 114  Temp: 36.8 C (98.2 F)  SpO2: 98%  Weight: (!) 104.1 kg (229 lb 6.4 oz)  Height: 152.4 cm (5')  PainSc:   6  PainLoc: Abdomen    Body mass index is 44.8 kg/m.   Physical Exam    Constitutional:  WDWN in NAD, conversant, no obvious deformities; lying in bed comfortably Eyes:  Pupils equal, round; sclera anicteric; moist conjunctiva; no lid lag HENT:  Oral mucosa moist; good dentition  Neck:  No masses palpated, trachea midline; no thyromegaly Lungs:  CTA bilaterally; normal respiratory effort CV:  Regular rate and rhythm; no murmurs; extremities well-perfused with no edema Abd:  +bowel sounds, soft, obese.  Well-healed laparoscopic incisions. Just above the umbilicus, there seems to be a palpable 5 cm subcutaneous fullness.  This area is moderately tender.  When she is relaxed the supine I cannot really reduce this area.  It does seem to enlarge with Valsalva maneuver.  However her body  habitus makes it difficult to clearly palpate a hernia sac. Musc:  Unable to assess gait; no apparent clubbing or cyanosis in extremities Lymphatic:  No palpable cervical or axillary lymphadenopathy Skin:  Warm, dry; no sign of jaundice Psychiatric - alert and oriented x 4; calm mood and  affect     CLINICAL INDICATION: Female, 33 years old. Possible supraumbilical ventral hernia   TECHNIQUE: Axial CT of the abdomen and pelvis with 80 mL Isovue -370 intravenous contrast. Multiplanar reformations provided. Unless otherwise specified, incidental thyroid , adrenal, renal lesions do not require dedicated imaging follow up. Additionally, any mentioned pulmonary nodules do not require dedicated imaging follow-up based on the Fleischner guidelines unless otherwise specified. Coronary calcifications are not identified unless otherwise specified.   COMPARISON: 01/15/2021   FINDINGS:   The lung bases are clear. The heart is normal in size. There is a small hiatal hernia.   The liver appears normal. The gallbladder is surgically absent. Lobulated splenic contour may be from remote infarcts. The pancreas appears normal. The adrenals are normal. The kidneys are normal. Abdominal aorta is normal in caliber. The bladder is normal. Uterus is normal. The appendix is normal. Large and small bowel loops are otherwise within normal limits. There is no free fluid or pathologic lymphadenopathy by size criteria. There is a moderate size fat-containing periumbilical hernia measuring 5.9 x 5.0 cm.   There are no acute osseous abnormalities.   IMPRESSION:   Moderate size fat-containing periumbilical hernia.   Additional small sliding-type hiatal hernia.       DOSE REDUCTION: This exam was performed according to our departmental dose-optimization program which includes automated exposure control, adjustment of the mA and/or kV according to patient size and/or use of iterative reconstruction  technique.   Electronically signed by: [place] Engel MD 12/07/2023 07:26 PM EDT RP Workstation: [id number]  Assessment and Plan:  Diagnoses and all orders for this visit:   Ventral hernia without obstruction or gangrene       Open repair of ventral (periumbilical) incisional hernia with mesh.  The surgical procedure has been discussed with the patient.  Potential risks, benefits, alternative treatments, and expected outcomes have been explained.  All of the patient's questions at this time have been answered.  The likelihood of reaching the patient's treatment goal is good.  The patient understand the proposed surgical procedure and wishes to proceed.  Kari Otto. Eli Grizzle, MD, [facility] Surgery  General Surgery   12/12/2023 10:59 AM

## 2023-12-25 ENCOUNTER — Telehealth: Payer: MEDICAID | Admitting: Family

## 2023-12-25 DIAGNOSIS — R399 Unspecified symptoms and signs involving the genitourinary system: Secondary | ICD-10-CM

## 2023-12-25 DIAGNOSIS — R109 Unspecified abdominal pain: Secondary | ICD-10-CM

## 2023-12-25 NOTE — Progress Notes (Signed)
  Because you are having back pain with UTI symptoms, I feel your condition warrants further evaluation and I recommend that you be seen in a face-to-face visit to rule out a more serious infection.    NOTE: There will be NO CHARGE for this E-Visit   If you are having a true medical emergency, please call 911.     For an urgent face to face visit, [place] has multiple urgent care centers for your convenience.  Click the link below for the full list of locations and hours, walk-in wait times, appointment scheduling options and driving directions:  Urgent Care - [place], [place], [place], [place], [place], [place]       Your MyChart E-visit questionnaire answers were reviewed by a board certified advanced clinical practitioner to complete your personal care plan based on your specific symptoms.    Thank you for using e-Visits.

## 2024-01-18 ENCOUNTER — Telehealth: Payer: MEDICAID | Admitting: Physician Assistant

## 2024-01-18 DIAGNOSIS — N3090 Cystitis, unspecified without hematuria: Secondary | ICD-10-CM | POA: Diagnosis not present

## 2024-01-18 MED ORDER — NITROFURANTOIN MONOHYD MACRO 100 MG PO CAPS: 100.0000 mg | ORAL_CAPSULE | Freq: Two times a day (BID) | ORAL | 0 refills | Status: AC

## 2024-01-18 NOTE — Progress Notes (Signed)
E-Visit for Urinary Problems  We are sorry that you are not feeling well.  Here is how we plan to help!  Based on what you shared with me it looks like you most likely have a simple urinary tract infection.  A UTI (Urinary Tract Infection) is a bacterial infection of the bladder.  Most cases of urinary tract infections are simple to treat but a key part of your care is to encourage you to drink plenty of fluids and watch your symptoms carefully.  I have prescribed MacroBid 100 mg twice a day for 5 days.  Your symptoms should gradually improve. Call us if the burning in your urine worsens, you develop worsening fever, back pain or pelvic pain or if your symptoms do not resolve after completing the antibiotic.  Urinary tract infections can be prevented by drinking plenty of water to keep your body hydrated.  Also be sure when you wipe, wipe from front to back and don't hold it in!  If possible, empty your bladder every 4 hours.  HOME CARE Drink plenty of fluids Compete the full course of the antibiotics even if the symptoms resolve Remember, when you need to go.go. Holding in your urine can increase the likelihood of getting a UTI! GET HELP RIGHT AWAY IF: You cannot urinate You get a high fever Worsening back pain occurs You see blood in your urine You feel sick to your stomach or throw up You feel like you are going to pass out  MAKE SURE YOU  Understand these instructions. Will watch your condition. Will get help right away if you are not doing well or get worse.   Thank you for choosing an e-visit.  Your e-visit answers were reviewed by a board certified advanced clinical practitioner to complete your personal care plan. Depending upon the condition, your plan could have included both over the counter or prescription medications.  Please review your pharmacy choice. Make sure the pharmacy is open so you can pick up prescription now. If there is a problem, you may contact your  provider through MyChart messaging and have the prescription routed to another pharmacy.  Your safety is important to us. If you have drug allergies check your prescription carefully.   For the next 24 hours you can use MyChart to ask questions about today's visit, request a non-urgent call back, or ask for a work or school excuse. You will get an email in the next two days asking about your experience. I hope that your e-visit has been valuable and will speed your recovery.  Approximately 5 minutes was spent documenting and reviewing patient's chart.   

## 2024-02-01 NOTE — Progress Notes (Signed)
 Surgical Instructions   Your procedure is scheduled on Thursday February 09, 2024. Report to [facility] Main Entrance A at 9:15 A.M., then check in with the Admitting office. Any questions or running late day of surgery: call [phone]  Questions prior to your surgery date: call [phone], Monday-Friday, 8am-4pm. If you experience any cold or flu symptoms such as cough, fever, chills, shortness of breath, etc. between now and your scheduled surgery, please notify us  at the above number.     Remember:  Do not eat after midnight the night before your surgery   You may drink clear liquids until 8:15 the morning of your surgery.   Clear liquids allowed are: Water, Non-Citrus Juices (without pulp), Carbonated Beverages, Clear Tea (no milk, honey, etc.), Black Coffee Only (NO MILK, CREAM OR POWDERED CREAMER of any kind), and Gatorade.  Patient Instructions  The night before surgery:  No food after midnight. ONLY clear liquids after midnight  The day of surgery (if you do NOT have diabetes):  Drink ONE (1) Pre-Surgery Clear Ensure by 8:15 the morning of surgery. Drink in one sitting. Do not sip.  This drink was given to you during your hospital  pre-op appointment visit.  Nothing else to drink after completing the  Pre-Surgery Clear Ensure.         If you have questions, please contact your surgeon's office.  Take these medicines the morning of surgery with A SIP OF WATER  levETIRAcetam  (KEPPRA )  pantoprazole  (PROTONIX )    May take these medicines IF NEEDED: albuterol  (VENTOLIN  HFA) 108 (90 Base) MCG/ACT inhaler Please bring with you to the hospital   One week prior to surgery, STOP taking any Aspirin (unless otherwise instructed by your surgeon) Aleve , Naproxen , Ibuprofen , Motrin , Advil , Goody's, BC's, all herbal medications, fish oil, and non-prescription vitamins.                     Do NOT Smoke (Tobacco/Vaping) for 24 hours prior to your procedure.  If you use a CPAP  at night, you may bring your mask/headgear for your overnight stay.   You will be asked to remove any contacts, glasses, piercing's, hearing aid's, dentures/partials prior to surgery. Please bring cases for these items if needed.    Patients discharged the day of surgery will not be allowed to drive home, and someone needs to stay with them for 24 hours.  SURGICAL WAITING ROOM VISITATION Patients may have no more than 2 support people in the waiting area - these visitors may rotate.   Pre-op nurse will coordinate an appropriate time for 1 ADULT support person, who may not rotate, to accompany patient in pre-op.  Children under the age of 22 must have an adult with them who is not the patient and must remain in the main waiting area with an adult.  If the patient needs to stay at the hospital during part of their recovery, the visitor guidelines for inpatient rooms apply.  Please refer to the [facility] website for the visitor guidelines for any additional information.   If you received a COVID test during your pre-op visit  it is requested that you wear a mask when out in public, stay away from anyone that may not be feeling well and notify your surgeon if you develop symptoms. If you have been in contact with anyone that has tested positive in the last 10 days please notify you surgeon.      Pre-operative CHG Bathing Instructions  You can play a key role in reducing the risk of infection after surgery. Your skin needs to be as free of germs as possible. You can reduce the number of germs on your skin by washing with CHG (chlorhexidine  gluconate) soap before surgery. CHG is an antiseptic soap that kills germs and continues to kill germs even after washing.   DO NOT use if you have an allergy to chlorhexidine /CHG or antibacterial soaps. If your skin becomes reddened or irritated, stop using the CHG and notify one of our RNs at [phone].              TAKE A SHOWER THE NIGHT BEFORE  SURGERY AND THE DAY OF SURGERY    Please keep in mind the following:  DO NOT shave, including legs and underarms, 48 hours prior to surgery.   Place clean sheets on your bed the night before surgery Use a clean washcloth (not used since being washed) for each shower. DO NOT sleep with pet's night before surgery.  CHG Shower Instructions:  Wash your face and private area with normal soap. If you choose to wash your hair, wash first with your normal shampoo.  After you use shampoo/soap, rinse your hair and body thoroughly to remove shampoo/soap residue.  Turn the water OFF and apply half the bottle of CHG soap to a CLEAN washcloth.  Apply CHG soap ONLY FROM YOUR NECK DOWN TO YOUR TOES (washing for 3-5 minutes)  DO NOT use CHG soap on face, private areas, open wounds, or sores.  Pay special attention to the area where your surgery is being performed.  If you are having back surgery, having someone wash your back for you may be helpful. Wait 2 minutes after CHG soap is applied, then you may rinse off the CHG soap.  Pat dry with a clean towel  Put on clean pajamas    Additional instructions for the day of surgery: DO NOT APPLY any lotions, deodorants or perfumes.   Do not wear jewelry or makeup Do not wear nail polish, gel polish, artificial nails, or any other type of covering on natural nails (fingers and toes) Do not bring valuables to the hospital. [facility] is not responsible for valuables/personal belongings. Put on clean/comfortable clothes.  Please brush your teeth.  Ask your nurse before applying any prescription medications to the skin.

## 2024-02-02 ENCOUNTER — Encounter (HOSPITAL_COMMUNITY)
Admission: RE | Admit: 2024-02-02 | Discharge: 2024-02-02 | Disposition: A | Discharge status: Home / Self Care | Payer: MEDICAID | Source: Ambulatory Visit | Attending: Surgery | Admitting: Surgery

## 2024-02-02 ENCOUNTER — Encounter (HOSPITAL_COMMUNITY): Payer: Self-pay

## 2024-02-02 ENCOUNTER — Other Ambulatory Visit: Payer: Self-pay

## 2024-02-02 VITALS — BP 124/85 | HR 80 | Temp 98.2°F | Resp 16 | Ht 60.0 in | Wt 223.7 lb

## 2024-02-02 DIAGNOSIS — F17200 Nicotine dependence, unspecified, uncomplicated: Secondary | ICD-10-CM | POA: Diagnosis not present

## 2024-02-02 DIAGNOSIS — R519 Headache, unspecified: Secondary | ICD-10-CM | POA: Insufficient documentation

## 2024-02-02 DIAGNOSIS — G4089 Other seizures: Secondary | ICD-10-CM | POA: Diagnosis not present

## 2024-02-02 DIAGNOSIS — Z79899 Other long term (current) drug therapy: Secondary | ICD-10-CM | POA: Insufficient documentation

## 2024-02-02 DIAGNOSIS — R9431 Abnormal electrocardiogram [ECG] [EKG]: Secondary | ICD-10-CM | POA: Diagnosis not present

## 2024-02-02 DIAGNOSIS — K219 Gastro-esophageal reflux disease without esophagitis: Secondary | ICD-10-CM | POA: Insufficient documentation

## 2024-02-02 DIAGNOSIS — Z01818 Encounter for other preprocedural examination: Secondary | ICD-10-CM | POA: Insufficient documentation

## 2024-02-02 DIAGNOSIS — F419 Anxiety disorder, unspecified: Secondary | ICD-10-CM | POA: Insufficient documentation

## 2024-02-02 DIAGNOSIS — G4733 Obstructive sleep apnea (adult) (pediatric): Secondary | ICD-10-CM | POA: Diagnosis not present

## 2024-02-02 DIAGNOSIS — F319 Bipolar disorder, unspecified: Secondary | ICD-10-CM | POA: Diagnosis not present

## 2024-02-02 DIAGNOSIS — J45909 Unspecified asthma, uncomplicated: Secondary | ICD-10-CM | POA: Diagnosis not present

## 2024-02-02 HISTORY — DX: Prediabetes: R73.03

## 2024-02-02 LAB — BASIC METABOLIC PANEL WITH GFR
Anion gap: 7 (ref 5–15)
BUN: 8 mg/dL (ref 6–20)
CO2: 24 mmol/L (ref 22–32)
Calcium: 9 mg/dL (ref 8.9–10.3)
Chloride: 105 mmol/L (ref 98–111)
Creatinine, Ser: 0.72 mg/dL (ref 0.44–1.00)
GFR, Estimated: >60 mL/min (ref >60)
Glucose, Bld: 151 mg/dL — ABNORMAL HIGH (ref 70–99)
Potassium: 4.3 mmol/L (ref 3.5–5.1)
Sodium: 136 mmol/L (ref 135–145)

## 2024-02-02 LAB — CBC
HCT: 41.6 % (ref 36.0–46.0)
Hemoglobin: 13.4 g/dL (ref 12.0–15.0)
MCH: 26.5 pg (ref 26.0–34.0)
MCHC: 32.2 g/dL (ref 30.0–36.0)
MCV: 82.4 fL (ref 80.0–100.0)
Platelets: 234 K/uL (ref 150–400)
RBC: 5.05 MIL/uL (ref 3.87–5.11)
RDW: 14.8 % (ref 11.5–15.5)
WBC: 9.9 K/uL (ref 4.0–10.5)
nRBC: 0 % (ref 0.0–0.2)

## 2024-02-02 NOTE — Progress Notes (Signed)
 PCP - [facility] -  Neurologist: Dr. Lawton, [facility]  PPM/ICD - denies Device Orders - na Rep Notified - na  Chest x-ray - 01/09/2023 EKG - PAT, 02/02/2024 Stress Test -  ECHO -  Cardiac Cath -   Sleep Study - OSA CPAP - denies  Non-diabetic  Blood Thinner Instructions:denies Aspirin Instructions:denies  ERAS Protcol - Ensure until 0815  Anesthesia review: Yes. Anesthetic complications, HTN, OSA, HLD, seizures  Patient denies shortness of breath, fever, cough and chest pain at PAT appointment   All instructions explained to the patient, with a verbal understanding of the material. Patient agrees to go over the instructions while at home for a better understanding. Patient also instructed to self quarantine after being tested for COVID-19. The opportunity to ask questions was provided.

## 2024-02-06 NOTE — Anesthesia Preprocedure Evaluation (Signed)
 Anesthesia Evaluation  Patient identified by MRN, date of birth, ID band Patient awake    Reviewed: Allergy & Precautions, NPO status , Patient's Chart, lab work & pertinent test results  History of Anesthesia Complications (+) PONV and history of anesthetic complications  Airway Mallampati: II  TM Distance: >3 FB Neck ROM: Full    Dental  (+) Edentulous Upper, Edentulous Lower, Dental Advisory Given   Pulmonary neg shortness of breath, asthma , neg sleep apnea, neg recent URI, Current Smoker and Patient abstained from smoking.   breath sounds clear to auscultation       Cardiovascular hypertension, (-) angina (-) Past MI and (-) CHF  Rhythm:Regular     Neuro/Psych  Headaches, Seizures -,  PSYCHIATRIC DISORDERS Anxiety Depression Bipolar Disorder      GI/Hepatic Neg liver ROS,GERD  Medicated and Controlled,,  Endo/Other  neg diabetes  Class 3 obesity  Renal/GU negative Renal ROS     Musculoskeletal negative musculoskeletal ROS (+)    Abdominal   Peds  Hematology negative hematology ROS (+) Lab Results      Component                Value               Date                      WBC                      9.9                 02/02/2024                HGB                      13.4                02/02/2024                HCT                      41.6                02/02/2024                MCV                      82.4                02/02/2024                PLT                      234                 02/02/2024              Anesthesia Other Findings   Reproductive/Obstetrics                              Anesthesia Physical Anesthesia Plan  ASA: 3  Anesthesia Plan: General   Post-op Pain Management: Tylenol  PO (pre-op)*   Induction: Intravenous  PONV Risk Score and Plan: 4 or greater and Ondansetron , Dexamethasone  and Midazolam   Airway Management Planned: Oral ETT  Additional  Equipment: None  Intra-op Plan:   Post-operative  Plan: Extubation in OR  Informed Consent: I have reviewed the patients History and Physical, chart, labs and discussed the procedure including the risks, benefits and alternatives for the proposed anesthesia with the patient or authorized representative who has indicated his/her understanding and acceptance.     Dental advisory given  Plan Discussed with: CRNA  Anesthesia Plan Comments: (PAT note by Lynwood Hope, PA-C:  33 year old female with pertinent history including asthma, OSA not on CPAP, current smoker, GERD on PPI, headaches, seizures, bipolar disorder.  Patient follows with neurologist Dr. Lawton at [facility] for history of seizures.  Recently had ambulatory EEG in February 2025.  Results discussed and telephone encounter by Dr. Lawton 09/09/2023, Spoke with patient regarding ambulatory EEG. While she was reading there appeared very subtle pauses when she had generalized 3 Hz spike-wave discharges lasting up to 5-6 seconds. 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 Patient notes that she does not drive. She is currently taking Keppra  2000 mg twice daily and oxcarbazepine 150 mg twice daily. The oxcarbazepine is from her psychiatrist for her bipolar. She is not sure if it is really helping. I discussed that 1 option we can consider is the addition of Depakote  250 mg twice daily to replace the oxcarbazepine as she denies any plans on pregnancy in the future. She believes she was on Depakote  in the past and made her more depressed but this was when she was very young and unclear. I advised her to speak with her psychiatrist about this option. Alternatively, we can try zonisamide or ethosuximide although given typically zonisamide being better tolerated will likely try that first. I also expressed my concern that she previously had taken more medication than advised but she notes that she has been 9 months without drug disuse. I explained  that Depakote  will can cause significant liver injury if taken more than prescribed and she expressed understanding. She will speak with her psychiatrist and give me an update.  Patient did subsequently call back on 09/12/2023 to advise that her psychiatrist had started her on Depakote  250 mg and was weaning her off Trileptal.  Antiepileptic regimen currently includes Depakote  250 mg nightly, Keppra  1000 mg twice daily.  She is also on clonazepam  0.5 mg twice daily as needed for anxiety and mirtazapine  15 mg nightly.  Patient had laparoscopic cholecystectomy on 01/22/2021, no perioperative complications noted.  Preop labs reviewed, unremarkable.  EKG 02/02/2024: Normal sinus rhythm.  Rate 84. Nonspecific T wave abnormality  )         Anesthesia Quick Evaluation

## 2024-02-06 NOTE — Progress Notes (Signed)
 Anesthesia Chart Review:  33 year old female with pertinent history including asthma, OSA not on CPAP, current smoker, GERD on PPI, headaches, seizures, bipolar disorder.  Patient follows with neurologist Dr. Lawton at [facility] for history of seizures.  Recently had ambulatory EEG in February 2025.  Results discussed and telephone encounter by Dr. Lawton 09/09/2023, Spoke with patient regarding ambulatory EEG. While she was reading there appeared very subtle pauses when she had generalized 3 Hz spike-wave discharges lasting up to 5-6 seconds. 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 Patient notes that she does not drive. She is currently taking Keppra  2000 mg twice daily and oxcarbazepine 150 mg twice daily. The oxcarbazepine is from her psychiatrist for her bipolar. She is not sure if it is really helping. I discussed that 1 option we can consider is the addition of Depakote  250 mg twice daily to replace the oxcarbazepine as she denies any plans on pregnancy in the future. She believes she was on Depakote  in the past and made her more depressed but this was when she was very young and unclear. I advised her to speak with her psychiatrist about this option. Alternatively, we can try zonisamide or ethosuximide although given typically zonisamide being better tolerated will likely try that first. I also expressed my concern that she previously had taken more medication than advised but she notes that she has been 9 months without drug disuse. I explained that Depakote  will can cause significant liver injury if taken more than prescribed and she expressed understanding. She will speak with her psychiatrist and give me an update.  Patient did subsequently call back on 09/12/2023 to advise that her psychiatrist had started her on Depakote  250 mg and was weaning her off Trileptal.  Antiepileptic regimen currently includes Depakote  250 mg nightly, Keppra  1000 mg twice daily.  She is also on clonazepam  0.5 mg  twice daily as needed for anxiety and mirtazapine  15 mg nightly.  Patient had laparoscopic cholecystectomy on 01/22/2021, no perioperative complications noted.  Preop labs reviewed, unremarkable.  EKG 02/02/2024: Normal sinus rhythm.  Rate 84. Nonspecific T wave abnormality     Lynwood Geofm [facility] Short Stay Center/Anesthesiology Phone [phone] 02/06/2024 1:52 PM

## 2024-02-08 NOTE — H&P (Signed)
 Subjective    Chief Complaint: New Problem (Periumbilical discomfort, lap chole 2022 )       History of Present Illness: Jacqueline Jones is a 33 y.o. female who is seen today as an office consultation at the request of Dr. Arch for evaluation of New Problem (Periumbilical discomfort, lap chole 2022 ) .   This is a 33 year old female who was initially seen in June 2022 with acute onset of epigastric abdominal pain.  She was found to have acute cholecystitis.  She was admitted to the hospital.  She underwent laparoscopic cholecystectomy with intraoperative cholangiogram on 01/22/2021.  Pathology revealed chronic calculus cholecystitis.   Over the last year, the patient has noticed increasing discomfort and swelling of the above the umbilicus.  This improves slightly when supine.  She has irritable bowel syndrome has daily diarrhea.  She has not noticed a change in her bowel movements.  She has gained about 40 pounds since her surgery in 2022.  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  She does smoke about half a pack of cigarettes each day.   Review of Systems: A complete review of systems was obtained from the patient.  I have reviewed this information and discussed as appropriate with the patient.  See HPI as well for other ROS.   Review of Systems  Constitutional: Negative.   HENT: Negative.    Eyes: Negative.   Respiratory: Negative.    Cardiovascular: Negative.   Gastrointestinal:  Positive for abdominal pain and diarrhea.  Genitourinary: Negative.   Musculoskeletal:  Positive for back pain.  Skin: Negative.   Neurological: Negative.   Endo/Heme/Allergies: Negative.   Psychiatric/Behavioral: Negative.          Medical History: Past Medical History         Past Medical History:  Diagnosis Date   Anxiety     Asthma, unspecified asthma severity, unspecified whether complicated, unspecified whether persistent (HHS-HCC)     GERD  (gastroesophageal reflux disease)     Seizures (CMS/HHS-HCC)          Problem List       Patient Active Problem List  Diagnosis   Elevated LFTs   Epigastric pain   Folliculitis   GAD (generalized anxiety disorder)   Gallstones   Hypokalemia   Lower urinary tract infectious disease   Major depressive disorder, recurrent episode, severe (CMS/HHS-HCC)   Medical non-compliance   Neurologic abnormality   Non-intractable vomiting   Overdose of sedative or hypnotic   Polysubstance dependence (CMS/HHS-HCC)   Pupil asymmetry   QT prolongation   Seizure (CMS/HHS-HCC)        Past Surgical History           Past Surgical History:  Procedure Laterality Date   gallbladder removal surgery N/A          Allergies           Allergies  Allergen Reactions   Hydromorphone Other (See Comments)      Had a seizure after receiving   Clonazepam  Other (See Comments)      Seizure   Cyclobenzaprine  Other (See Comments)      seizure   Gabapentin Other (See Comments)      seizures   Hydrocodone -Acetaminophen  Itching      Pt states she can take Tylenol  without difficulty.   Adhesive Rash   Azithromycin Rash   Benzonatate Rash   Cephalexin  Rash      Did it involve  swelling of the face/tongue/throat, SOB, or low BP? No Did it involve sudden or severe rash/hives, skin peeling, or any reaction on the inside of your mouth or nose? No Did you need to seek medical attention at a hospital or doctor's office? No When did it last happen?       If all above answers are "NO", may proceed with cephalosporin use.        Medications Ordered Prior to Encounter             Current Outpatient Medications on File Prior to Visit  Medication Sig Dispense Refill   lamoTRIgine  (LAMICTAL ) 200 MG tablet Take 200 mg by mouth 2 (two) times daily       levETIRAcetam  (KEPPRA ) 1000 MG tablet Take by mouth       oxyCODONE  (ROXICODONE ) 5 MG immediate release tablet Take 1 tablet (5 mg total) by mouth every 6  (six) hours as needed for Pain 30 tablet 0   cholestyramine (QUESTRAN) 4 gram oral powder Take 4 g by mouth 3 (three) times daily before meals Mix dose in 60-180 mL of water, milk or juice. 240 g 11    No current facility-administered medications on file prior to visit.        Family History           Family History  Problem Relation Age of Onset   High blood pressure (Hypertension) Mother     Obesity Sister          Tobacco Use History  Social History           Tobacco Use  Smoking Status Every Day   Current packs/day: 3.00   Types: Cigarettes  Smokeless Tobacco Never        Social History  Social History             Socioeconomic History   Marital status: Unknown  Tobacco Use   Smoking status: Every Day      Current packs/day: 3.00      Types: Cigarettes   Smokeless tobacco: Never  Vaping Use   Vaping status: Never Used    Social Drivers of Health      Received from [employer]    Social Network        Objective:           Vitals:    10/19/23 1349  BP: 123/84  Pulse: (!) 114  Temp: 36.8 C (98.2 F)  SpO2: 98%  Weight: (!) 104.1 kg (229 lb 6.4 oz)  Height: 152.4 cm (5')  PainSc:   6  PainLoc: Abdomen    Body mass index is 44.8 kg/m.   Physical Exam    Constitutional:  WDWN in NAD, conversant, no obvious deformities; lying in bed comfortably Eyes:  Pupils equal, round; sclera anicteric; moist conjunctiva; no lid lag HENT:  Oral mucosa moist; good dentition  Neck:  No masses palpated, trachea midline; no thyromegaly Lungs:  CTA bilaterally; normal respiratory effort CV:  Regular rate and rhythm; no murmurs; extremities well-perfused with no edema Abd:  +bowel sounds, soft, obese.  Well-healed laparoscopic incisions. Just above the umbilicus, there seems to be a palpable 5 cm subcutaneous fullness.  This area is moderately tender.  When she is relaxed the supine I cannot really reduce this area.  It does seem to enlarge with Valsalva  maneuver.  However her body habitus makes it difficult to clearly palpate a hernia sac. Musc:  Unable to assess gait; no apparent  clubbing or cyanosis in extremities Lymphatic:  No palpable cervical or axillary lymphadenopathy Skin:  Warm, dry; no sign of jaundice Psychiatric - alert and oriented x 4; calm mood and affect     CLINICAL INDICATION: Female, 33 years old. Possible supraumbilical ventral hernia   TECHNIQUE: Axial CT of the abdomen and pelvis with 80 mL Isovue -370 intravenous contrast. Multiplanar reformations provided. Unless otherwise specified, incidental thyroid , adrenal, renal lesions do not require dedicated imaging follow up. Additionally, any mentioned pulmonary nodules do not require dedicated imaging follow-up based on the Fleischner guidelines unless otherwise specified. Coronary calcifications are not identified unless otherwise specified.   COMPARISON: 01/15/2021   FINDINGS:   The lung bases are clear. The heart is normal in size. There is a small hiatal hernia.   The liver appears normal. The gallbladder is surgically absent. Lobulated splenic contour may be from remote infarcts. The pancreas appears normal. The adrenals are normal. The kidneys are normal. Abdominal aorta is normal in caliber. The bladder is normal. Uterus is normal. The appendix is normal. Large and small bowel loops are otherwise within normal limits. There is no free fluid or pathologic lymphadenopathy by size criteria. There is a moderate size fat-containing periumbilical hernia measuring 5.9 x 5.0 cm.   There are no acute osseous abnormalities.   IMPRESSION:   Moderate size fat-containing periumbilical hernia.   Additional small sliding-type hiatal hernia.       DOSE REDUCTION: This exam was performed according to our departmental dose-optimization program which includes automated exposure control, adjustment of the mA and/or kV according to patient size and/or use of  iterative reconstruction technique.   Electronically signed by: Italy Engel MD 12/07/2023 07:26 PM EDT RP Workstation: [id number]   Assessment and Plan:  Diagnoses and all orders for this visit:   Ventral hernia without obstruction or gangrene       Open repair of ventral (periumbilical) incisional hernia with mesh.  The surgical procedure has been discussed with the patient.  Potential risks, benefits, alternative treatments, and expected outcomes have been explained.  All of the patient's questions at this time have been answered.  The likelihood of reaching the patient's treatment goal is good.  The patient understand the proposed surgical procedure and wishes to proceed.    Donnice POUR. Belinda, MD, [facility]  General Surgery   02/08/2024 11:30 AM

## 2024-02-09 ENCOUNTER — Ambulatory Visit (HOSPITAL_BASED_OUTPATIENT_CLINIC_OR_DEPARTMENT_OTHER): Payer: MEDICAID | Admitting: Anesthesiology

## 2024-02-09 ENCOUNTER — Ambulatory Visit (HOSPITAL_COMMUNITY): Payer: MEDICAID | Admitting: Physician Assistant

## 2024-02-09 ENCOUNTER — Other Ambulatory Visit: Payer: Self-pay

## 2024-02-09 ENCOUNTER — Encounter (HOSPITAL_COMMUNITY): Payer: Self-pay | Admitting: Surgery

## 2024-02-09 ENCOUNTER — Encounter (HOSPITAL_COMMUNITY)
Admission: RE | Disposition: A | Discharge status: Home / Self Care | Payer: Self-pay | Source: Home / Self Care | Attending: Surgery

## 2024-02-09 ENCOUNTER — Ambulatory Visit (HOSPITAL_COMMUNITY)
Admission: RE | Admit: 2024-02-09 | Discharge: 2024-02-09 | Disposition: A | Discharge status: Home / Self Care | Payer: MEDICAID | Attending: Surgery | Admitting: Surgery

## 2024-02-09 ENCOUNTER — Other Ambulatory Visit (HOSPITAL_COMMUNITY): Payer: Self-pay

## 2024-02-09 DIAGNOSIS — I10 Essential (primary) hypertension: Secondary | ICD-10-CM | POA: Diagnosis not present

## 2024-02-09 DIAGNOSIS — F17210 Nicotine dependence, cigarettes, uncomplicated: Secondary | ICD-10-CM | POA: Insufficient documentation

## 2024-02-09 DIAGNOSIS — F418 Other specified anxiety disorders: Secondary | ICD-10-CM

## 2024-02-09 DIAGNOSIS — K429 Umbilical hernia without obstruction or gangrene: Secondary | ICD-10-CM | POA: Insufficient documentation

## 2024-02-09 DIAGNOSIS — K432 Incisional hernia without obstruction or gangrene: Secondary | ICD-10-CM | POA: Diagnosis not present

## 2024-02-09 DIAGNOSIS — Z8249 Family history of ischemic heart disease and other diseases of the circulatory system: Secondary | ICD-10-CM | POA: Diagnosis not present

## 2024-02-09 DIAGNOSIS — K439 Ventral hernia without obstruction or gangrene: Secondary | ICD-10-CM | POA: Diagnosis present

## 2024-02-09 DIAGNOSIS — J45909 Unspecified asthma, uncomplicated: Secondary | ICD-10-CM | POA: Insufficient documentation

## 2024-02-09 DIAGNOSIS — F17200 Nicotine dependence, unspecified, uncomplicated: Secondary | ICD-10-CM | POA: Diagnosis not present

## 2024-02-09 DIAGNOSIS — Z6841 Body Mass Index (BMI) 40.0 and over, adult: Secondary | ICD-10-CM | POA: Insufficient documentation

## 2024-02-09 DIAGNOSIS — K219 Gastro-esophageal reflux disease without esophagitis: Secondary | ICD-10-CM | POA: Insufficient documentation

## 2024-02-09 DIAGNOSIS — E66813 Obesity, class 3: Secondary | ICD-10-CM | POA: Insufficient documentation

## 2024-02-09 DIAGNOSIS — K580 Irritable bowel syndrome with diarrhea: Secondary | ICD-10-CM | POA: Insufficient documentation

## 2024-02-09 DIAGNOSIS — F319 Bipolar disorder, unspecified: Secondary | ICD-10-CM | POA: Diagnosis not present

## 2024-02-09 HISTORY — PX: VENTRAL HERNIA REPAIR: SHX424

## 2024-02-09 HISTORY — PX: INSERTION OF MESH: SHX5868

## 2024-02-09 HISTORY — DX: Other specified postprocedural states: Z98.890

## 2024-02-09 LAB — POCT PREGNANCY, URINE: Preg Test, Ur: NEGATIVE

## 2024-02-09 SURGERY — REPAIR, HERNIA, VENTRAL
Anesthesia: General | Site: Abdomen

## 2024-02-09 MED ORDER — LIDOCAINE 2% (20 MG/ML) 5 ML SYRINGE
INTRAMUSCULAR | Status: DC | PRN
  Administered 2024-02-09: 100 mg via INTRAVENOUS

## 2024-02-09 MED ORDER — ROCURONIUM BROMIDE 10 MG/ML (PF) SYRINGE
PREFILLED_SYRINGE | INTRAVENOUS | Status: DC | PRN
  Administered 2024-02-09: 80 mg via INTRAVENOUS

## 2024-02-09 MED ORDER — OXYCODONE HCL 5 MG/5ML PO SOLN
5.0000 mg | Freq: Once | ORAL | Status: AC | PRN
  Administered 2024-02-09: 5 mg via ORAL

## 2024-02-09 MED ORDER — BUPIVACAINE-EPINEPHRINE 0.25% -1:200000 IJ SOLN
INTRAMUSCULAR | Status: DC | PRN
  Administered 2024-02-09: 20 mL

## 2024-02-09 MED ORDER — 0.9 % SODIUM CHLORIDE (POUR BTL) OPTIME
TOPICAL | Status: DC | PRN
  Administered 2024-02-09: 1000 mL

## 2024-02-09 MED ORDER — FENTANYL CITRATE (PF) 250 MCG/5ML IJ SOLN
INTRAMUSCULAR | Status: DC | PRN
  Administered 2024-02-09: 50 ug via INTRAVENOUS
  Administered 2024-02-09: 100 ug via INTRAVENOUS
  Administered 2024-02-09: 50 ug via INTRAVENOUS

## 2024-02-09 MED ORDER — PROPOFOL 10 MG/ML IV BOLUS
INTRAVENOUS | Status: DC | PRN
  Administered 2024-02-09: 200 mg via INTRAVENOUS

## 2024-02-09 MED ORDER — FENTANYL CITRATE (PF) 100 MCG/2ML IJ SOLN
25.0000 ug | INTRAMUSCULAR | Status: DC | PRN
  Administered 2024-02-09: 50 ug via INTRAVENOUS

## 2024-02-09 MED ORDER — CHLORHEXIDINE GLUCONATE 0.12 % MT SOLN
15.0000 mL | Freq: Once | OROMUCOSAL | Status: AC
  Administered 2024-02-09: 15 mL via OROMUCOSAL
  Filled 2024-02-09: qty 15

## 2024-02-09 MED ORDER — ACETAMINOPHEN 10 MG/ML IV SOLN: 1000.0000 mg | Freq: Once | INTRAVENOUS | Status: DC | PRN

## 2024-02-09 MED ORDER — SUGAMMADEX SODIUM 200 MG/2ML IV SOLN
INTRAVENOUS | Status: AC
  Filled 2024-02-09: qty 2

## 2024-02-09 MED ORDER — ORAL CARE MOUTH RINSE: 15.0000 mL | Freq: Once | OROMUCOSAL | Status: AC

## 2024-02-09 MED ORDER — ACETAMINOPHEN 500 MG PO TABS
1000.0000 mg | ORAL_TABLET | ORAL | Status: AC
  Administered 2024-02-09: 1000 mg via ORAL
  Filled 2024-02-09: qty 2

## 2024-02-09 MED ORDER — FENTANYL CITRATE (PF) 250 MCG/5ML IJ SOLN
INTRAMUSCULAR | Status: AC
  Filled 2024-02-09: qty 5

## 2024-02-09 MED ORDER — BUPIVACAINE-EPINEPHRINE (PF) 0.25% -1:200000 IJ SOLN
INTRAMUSCULAR | Status: AC
  Filled 2024-02-09: qty 30

## 2024-02-09 MED ORDER — VANCOMYCIN HCL 1500 MG/300ML IV SOLN
1500.0000 mg | INTRAVENOUS | Status: AC
  Administered 2024-02-09: 1500 mg via INTRAVENOUS
  Filled 2024-02-09: qty 300

## 2024-02-09 MED ORDER — OXYCODONE HCL 5 MG/5ML PO SOLN
ORAL | Status: AC
  Filled 2024-02-09: qty 5

## 2024-02-09 MED ORDER — CHLORHEXIDINE GLUCONATE CLOTH 2 % EX PADS: 6.0000 | MEDICATED_PAD | Freq: Once | CUTANEOUS | Status: DC

## 2024-02-09 MED ORDER — FENTANYL CITRATE (PF) 100 MCG/2ML IJ SOLN
INTRAMUSCULAR | Status: AC
Start: 2024-02-09 — End: 2024-02-09
  Filled 2024-02-09: qty 2

## 2024-02-09 MED ORDER — MIDAZOLAM HCL 2 MG/2ML IJ SOLN
INTRAMUSCULAR | Status: AC
  Filled 2024-02-09: qty 2

## 2024-02-09 MED ORDER — SUGAMMADEX SODIUM 200 MG/2ML IV SOLN
INTRAVENOUS | Status: DC | PRN
  Administered 2024-02-09: 400 mg via INTRAVENOUS

## 2024-02-09 MED ORDER — MIDAZOLAM HCL 2 MG/2ML IJ SOLN
INTRAMUSCULAR | Status: DC | PRN
  Administered 2024-02-09: 2 mg via INTRAVENOUS

## 2024-02-09 MED ORDER — PROPOFOL 10 MG/ML IV BOLUS
INTRAVENOUS | Status: AC
  Filled 2024-02-09: qty 20

## 2024-02-09 MED ORDER — DEXAMETHASONE SODIUM PHOSPHATE 10 MG/ML IJ SOLN
INTRAMUSCULAR | Status: DC | PRN
  Administered 2024-02-09: 10 mg via INTRAVENOUS

## 2024-02-09 MED ORDER — ONDANSETRON HCL 4 MG/2ML IJ SOLN
INTRAMUSCULAR | Status: DC | PRN
  Administered 2024-02-09: 4 mg via INTRAVENOUS

## 2024-02-09 MED ORDER — OXYCODONE HCL 5 MG PO TABS: 5.0000 mg | ORAL_TABLET | Freq: Once | ORAL | Status: AC | PRN

## 2024-02-09 MED ORDER — TRAMADOL HCL 50 MG PO TABS
50.0000 mg | ORAL_TABLET | Freq: Four times a day (QID) | ORAL | 0 refills | Status: DC | PRN
  Filled 2024-02-09: qty 30, 4d supply, fill #0

## 2024-02-09 MED ORDER — LACTATED RINGERS IV SOLN: INTRAVENOUS | Status: DC

## 2024-02-09 SURGICAL SUPPLY — 35 items
BAG COUNTER SPONGE SURGICOUNT (BAG) ×2 IMPLANT
BENZOIN TINCTURE PRP APPL 2/3 (GAUZE/BANDAGES/DRESSINGS) ×2 IMPLANT
BINDER ABDOMINAL 12 ML 46-62 (SOFTGOODS) ×2 IMPLANT
BLADE CLIPPER SURG (BLADE) IMPLANT
CANISTER SUCTION 3000ML PPV (SUCTIONS) ×2 IMPLANT
CHLORAPREP W/TINT 26 (MISCELLANEOUS) IMPLANT
CLSR STERI-STRIP ANTIMIC 1/2X4 (GAUZE/BANDAGES/DRESSINGS) IMPLANT
COVER SURGICAL LIGHT HANDLE (MISCELLANEOUS) ×2 IMPLANT
DRAPE LAPAROSCOPIC ABDOMINAL (DRAPES) ×2 IMPLANT
DRSG TEGADERM 4X4.75 (GAUZE/BANDAGES/DRESSINGS) ×2 IMPLANT
ELECT CAUTERY BLADE 6.4 (BLADE) ×2 IMPLANT
ELECTRODE REM PT RTRN 9FT ADLT (ELECTROSURGICAL) ×2 IMPLANT
GAUZE SPONGE 2X2 8PLY STRL LF (GAUZE/BANDAGES/DRESSINGS) IMPLANT
GAUZE SPONGE 4X4 12PLY STRL (GAUZE/BANDAGES/DRESSINGS) ×2 IMPLANT
GLOVE BIO SURGEON STRL SZ7 (GLOVE) ×2 IMPLANT
GLOVE BIOGEL PI IND STRL 7.5 (GLOVE) ×2 IMPLANT
GOWN STRL REUS W/ TWL LRG LVL3 (GOWN DISPOSABLE) ×4 IMPLANT
KIT BASIN OR (CUSTOM PROCEDURE TRAY) ×2 IMPLANT
KIT TURNOVER KIT B (KITS) ×2 IMPLANT
MESH VENTRALEX ST 2.5 CRC MED (Mesh General) IMPLANT
NDL HYPO 25GX1X1/2 BEV (NEEDLE) ×2 IMPLANT
NEEDLE HYPO 25GX1X1/2 BEV (NEEDLE) ×2 IMPLANT
NS IRRIG 1000ML POUR BTL (IV SOLUTION) ×2 IMPLANT
PACK GENERAL/GYN (CUSTOM PROCEDURE TRAY) ×2 IMPLANT
PAD ARMBOARD POSITIONER FOAM (MISCELLANEOUS) ×4 IMPLANT
PENCIL SMOKE EVACUATOR (MISCELLANEOUS) ×2 IMPLANT
STRIP CLOSURE SKIN 1/2X4 (GAUZE/BANDAGES/DRESSINGS) ×2 IMPLANT
SUT MNCRL AB 4-0 PS2 18 (SUTURE) ×2 IMPLANT
SUT NOVA NAB DX-16 0-1 5-0 T12 (SUTURE) ×2 IMPLANT
SUT NOVA NAB GS-21 0 18 T12 DT (SUTURE) ×2 IMPLANT
SUT VIC AB 3-0 SH 27XBRD (SUTURE) ×2 IMPLANT
SYR CONTROL 10ML LL (SYRINGE) ×2 IMPLANT
TOWEL GREEN STERILE (TOWEL DISPOSABLE) ×2 IMPLANT
TOWEL GREEN STERILE FF (TOWEL DISPOSABLE) ×2 IMPLANT
TRAY FOLEY MTR SLVR 14FR STAT (SET/KITS/TRAYS/PACK) IMPLANT

## 2024-02-09 NOTE — Op Note (Signed)
 Ventral Hernia Repair with mesh Procedure Note  Indications: This is a 33 year old female who was initially seen in June 2022 with acute onset of epigastric abdominal pain.  She was found to have acute cholecystitis.  She was admitted to the hospital.  She underwent laparoscopic cholecystectomy with intraoperative cholangiogram on 01/22/2021.  Pathology revealed chronic calculus cholecystitis.   Over the last year, the patient has noticed increasing discomfort and swelling of the above the umbilicus.  This improves slightly when supine.  She has irritable bowel syndrome has daily diarrhea.  She has not noticed a change in her bowel movements.  She has gained about 40 pounds since her surgery in 2022.  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  She does smoke about half a pack of cigarettes each day.  CT scan confirmed a periumbilical ventral incisional hernia with no involvement of the small bowel  Pre-operative Diagnosis: Periumbilical ventral incisional hernia  Post-operative Diagnosis: Same (3.5 cm defect)  Surgeon: Jacqueline Jones   Assistants: none  Anesthesia: General endotracheal anesthesia  ASA Class: 2  Procedure Details  The patient was seen in the Holding Room. The risks, benefits, complications, treatment options, and expected outcomes were discussed with the patient. The possibilities of reaction to medication, pulmonary aspiration, perforation of viscus, bleeding, recurrent infection, the need for additional procedures, failure to diagnose a condition, and creating a complication requiring transfusion or operation were discussed with the patient. The patient concurred with the proposed plan, giving informed consent.  The site of surgery properly noted/marked. The patient was taken to the operating room, identified as Jacqueline Jones and the procedure verified as ventral hernia repair. A Time Out was held and the above information  confirmed.  The patient was placed supine.  After establishing general anesthesia, the abdomen was prepped with Chloraprep and draped in sterile fashion.  We made a transverse incision over the palpable hernia using her old scar.   Dissection was carried down to the hernia sac located above the fascia and mobilized from surrounding structures.  Intact fascia was identified circumferentially around the defect.  The hernia sac was opened and we reduced some omentum.  The defect measured 3.5 cm in diameter.  We used a medium Ventralex mesh and secured this to the fascia with interrupted 0 Novofil sutures.  The fascial defect was reapproximated with interrupted figure-of-8 1 Novofil sutures.  The subcutaneous tissues were irrigated.  Hemostasis was confirmed.  The skin incision was closed in layers with a 3-0 Vicryl in the subcutaneous space and a 4-0 Monocryl subcuticular closure.  Steri-Strips were applied at the end of the operation.    Instrument, sponge, and needle counts were correct prior to closure and at the conclusion of the case.   Findings: 3.5 cm hernia defect containing omentum  Estimated Blood Loss:  Minimal         Drains: none                      Complications:  None; patient tolerated the procedure well.         Disposition: PACU - hemodynamically stable.         Condition: stable  Jacqueline MARLA. Lima, MD, [facility]  General Surgery   02/09/2024 8:37 AM

## 2024-02-09 NOTE — Anesthesia Procedure Notes (Signed)
 Procedure Name: Intubation Date/Time: 02/09/2024 7:44 AM  Performed by: Virgil Ee, CRNAPre-anesthesia Checklist: Patient identified, Patient being monitored, Timeout performed, Emergency Drugs available and Suction available Patient Re-evaluated:Patient Re-evaluated prior to induction Oxygen Delivery Method: Circle system utilized Preoxygenation: Pre-oxygenation with 100% oxygen Induction Type: IV induction Ventilation: Mask ventilation without difficulty Laryngoscope Size: Mac and 3 Grade View: Grade I Tube type: Oral Tube size: 7.0 mm Number of attempts: 1 Airway Equipment and Method: Stylet Placement Confirmation: ETT inserted through vocal cords under direct vision, positive ETCO2 and breath sounds checked- equal and bilateral Secured at: 21 cm Tube secured with: Tape Dental Injury: Teeth and Oropharynx as per pre-operative assessment

## 2024-02-09 NOTE — Transfer of Care (Signed)
 Immediate Anesthesia Transfer of Care Note  Patient: Jacqueline Jones  Procedure(s) Performed: REPAIR, HERNIA, VENTRAL INSERTION OF MESH (Abdomen)  Patient Location: PACU  Anesthesia Type:General  Level of Consciousness: awake  Airway & Oxygen Therapy: Patient Spontanous Breathing and Patient connected to nasal cannula oxygen  Post-op Assessment: Report given to RN and Post -op Vital signs reviewed and stable  Post vital signs: Reviewed and stable  Last Vitals:  Vitals Value Taken Time  BP 117/53 02/09/24 08:45  Temp    Pulse 101 02/09/24 08:51  Resp 13 02/09/24 08:51  SpO2 96 % 02/09/24 08:51  Vitals shown include unfiled device data.  Last Pain:  Vitals:   02/09/24 0614  TempSrc: Oral  PainSc: 6          Complications: No notable events documented.

## 2024-02-09 NOTE — Interval H&P Note (Signed)
 History and Physical Interval Note:  02/09/2024 7:23 AM  Jacqueline Jones  has presented today for surgery, with the diagnosis of VENTRAL INCISIONAL HERNIA.  The various methods of treatment have been discussed with the patient and family. After consideration of risks, benefits and other options for treatment, the patient has consented to  Procedure(s): REPAIR, HERNIA, VENTRAL (N/A) as a surgical intervention.  The patient's history has been reviewed, patient examined, no change in status, stable for surgery.  I have reviewed the patient's chart and labs.  Questions were answered to the patient's satisfaction.     Jacqueline Jones

## 2024-02-09 NOTE — Discharge Instructions (Signed)
 [facility]__[facility]   HERNIA REPAIR: POST OP INSTRUCTIONS  Always review your discharge instruction sheet given to you by the facility where your surgery was performed. IF YOU HAVE DISABILITY OR FAMILY LEAVE FORMS, YOU MUST BRING THEM TO THE OFFICE FOR PROCESSING.   DO NOT GIVE THEM TO YOUR DOCTOR.  1. A  prescription for pain medication may be given to you upon discharge.  Take your pain medication as prescribed, if needed.  If narcotic pain medicine is not needed, then you may take acetaminophen  (Tylenol ) or ibuprofen  (Advil ) as needed. 2. Take your usually prescribed medications unless otherwise directed. If you need a refill on your pain medication, please contact your pharmacy.  They will contact our office to request authorization. Prescriptions will not be filled after 5 pm or on week-ends. 3. You should follow a light diet the first 24 hours after arrival home, such as soup and crackers, etc.  Be sure to include lots of fluids daily.  Resume your normal diet the day after surgery. 4.Most patients will experience some swelling and bruising around the umbilicus.  Ice packs and reclining will help.  Swelling and bruising can take several days to resolve.  6. It is common to experience some constipation if taking pain medication after surgery.  Increasing fluid intake and taking a stool softener (such as Colace) will usually help or prevent this problem from occurring.  A mild laxative (Milk of Magnesia or Miralax ) should be taken according to package directions if there are no bowel movements after 48 hours. 7. Unless discharge instructions indicate otherwise, you may remove your bandages 24-48 hours after surgery, and you may shower at that time.  You may have steri-strips (small skin tapes) in place directly over the incision.  These strips should be left on the skin for 7-10 days.   8. ACTIVITIES:  You may resume regular (light) daily activities beginning the next day--such  as daily self-care, walking, climbing stairs--gradually increasing activities as tolerated.  You may have sexual intercourse when it is comfortable.  Refrain from any heavy lifting or straining until approved by your doctor.  a.You may drive when you are no longer taking prescription pain medication, you can comfortably wear a seatbelt, and you can safely maneuver your car and apply brakes. b.RETURN TO WORK:   _____________________________________________  9.You should see your doctor in the office for a follow-up appointment approximately 2-3 weeks after your surgery.  Make sure that you call for this appointment within a day or two after you arrive home to insure a convenient appointment time. 10.OTHER INSTRUCTIONS: _________________________    _____________________________________  WHEN TO CALL YOUR DOCTOR: Fever over 101.0 Inability to urinate Nausea and/or vomiting Extreme swelling or bruising Continued bleeding from incision. Increased pain, redness, or drainage from the incision  The clinic staff is available to answer your questions during regular business hours.  Please don't hesitate to call and ask to speak to one of the nurses for clinical concerns.  If you have a medical emergency, go to the nearest emergency room or call 911.  A surgeon from [facility] is always on call at the hospital   [address], [address], [place], [place]  [address] ?  [address] [address], [place], [place]   [address] [phone] ? [phone] ? FAX [phone] Web site: [email or web address]

## 2024-02-10 ENCOUNTER — Encounter (HOSPITAL_COMMUNITY): Payer: Self-pay | Admitting: Surgery

## 2024-02-13 LAB — SURGICAL PATHOLOGY

## 2024-02-14 NOTE — Anesthesia Postprocedure Evaluation (Signed)
 Anesthesia Post Note  Patient: Jacqueline Jones  Procedure(s) Performed: REPAIR, HERNIA, VENTRAL INSERTION OF MESH (Abdomen)     Patient location during evaluation: PACU Anesthesia Type: General Level of consciousness: awake and alert Pain management: pain level controlled Vital Signs Assessment: post-procedure vital signs reviewed and stable Respiratory status: spontaneous breathing, nonlabored ventilation and respiratory function stable Cardiovascular status: blood pressure returned to baseline and stable Postop Assessment: no apparent nausea or vomiting Anesthetic complications: no   No notable events documented.              Kaiser Belluomini

## 2024-02-26 ENCOUNTER — Telehealth: Payer: MEDICAID | Admitting: Physician Assistant

## 2024-02-26 DIAGNOSIS — B9689 Other specified bacterial agents as the cause of diseases classified elsewhere: Secondary | ICD-10-CM

## 2024-02-26 DIAGNOSIS — J208 Acute bronchitis due to other specified organisms: Secondary | ICD-10-CM

## 2024-02-26 MED ORDER — DOXYCYCLINE HYCLATE 100 MG PO TABS: 100.0000 mg | ORAL_TABLET | Freq: Two times a day (BID) | ORAL | 0 refills | Status: AC

## 2024-02-26 NOTE — Progress Notes (Signed)
 E-Visit for Cough   We are sorry that you are not feeling well.  Here is how we plan to help!  Based on your presentation I believe you most likely have A cough due to bacteria.  When patients have a fever and a productive cough with a change in color or increased sputum production, we are concerned about bacterial bronchitis.  If left untreated it can progress to pneumonia.  If your symptoms do not improve with your treatment plan it is important that you contact your provider.   I have prescribed Doxycycline  100 mg twice a day for 7 days     In addition you may use A prescription cough medication called Tessalon Perles 100mg . You may take 1-2 capsules every 8 hours as needed for your cough.  From your responses in the eVisit questionnaire you describe inflammation in the upper respiratory tract which is causing a significant cough.  This is commonly called Bronchitis and has four common causes:   Allergies Viral Infections Acid Reflux Bacterial Infection Allergies, viruses and acid reflux are treated by controlling symptoms or eliminating the cause. An example might be a cough caused by taking certain blood pressure medications. You stop the cough by changing the medication. Another example might be a cough caused by acid reflux. Controlling the reflux helps control the cough.  USE OF BRONCHODILATOR (RESCUE) INHALERS: There is a risk from using your bronchodilator too frequently.  The risk is that over-reliance on a medication which only relaxes the muscles surrounding the breathing tubes can reduce the effectiveness of medications prescribed to reduce swelling and congestion of the tubes themselves.  Although you feel brief relief from the bronchodilator inhaler, your asthma may actually be worsening with the tubes becoming more swollen and filled with mucus.  This can delay other crucial treatments, such as oral steroid medications. If you need to use a bronchodilator inhaler daily, several  times per day, you should discuss this with your provider.  There are probably better treatments that could be used to keep your asthma under control.     HOME CARE Only take medications as instructed by your medical team. Complete the entire course of an antibiotic. Drink plenty of fluids and get plenty of rest. Avoid close contacts especially the very young and the elderly Cover your mouth if you cough or cough into your sleeve. Always remember to wash your hands A steam or ultrasonic humidifier can help congestion.   GET HELP RIGHT AWAY IF: You develop worsening fever. You become short of breath You cough up blood. Your symptoms persist after you have completed your treatment plan MAKE SURE YOU  Understand these instructions. Will watch your condition. Will get help right away if you are not doing well or get worse.    Thank you for choosing an e-visit.  Your e-visit answers were reviewed by a board certified advanced clinical practitioner to complete your personal care plan. Depending upon the condition, your plan could have included both over the counter or prescription medications.  Please review your pharmacy choice. Make sure the pharmacy is open so you can pick up prescription now. If there is a problem, you may contact your provider through [employer] and have the prescription routed to another pharmacy.  Your safety is important to us . If you have drug allergies check your prescription carefully.   For the next 24 hours you can use MyChart to ask questions about today's visit, request a non-urgent call back, or ask for  a work or school excuse. You will get an email in the next two days asking about your experience. I hope that your e-visit has been valuable and will speed your recovery.   I have spent 5 minutes in review of e-visit questionnaire, review and updating patient chart, medical decision making and response to patient.   Harlene PEDLAR Ward, PA-C

## 2024-04-18 ENCOUNTER — Telehealth: Payer: MEDICAID | Admitting: Physician Assistant

## 2024-04-18 DIAGNOSIS — J301 Allergic rhinitis due to pollen: Secondary | ICD-10-CM

## 2024-04-18 MED ORDER — FLUTICASONE PROPIONATE 50 MCG/ACT NA SUSP: 2.0000 | Freq: Every day | NASAL | 0 refills | Status: AC

## 2024-04-18 MED ORDER — CETIRIZINE HCL 10 MG PO TABS: 10.0000 mg | ORAL_TABLET | Freq: Every day | ORAL | 0 refills | Status: AC

## 2024-04-18 NOTE — Progress Notes (Signed)
 E visit for Allergic Rhinitis We are sorry that you are not feeling well.  Here is how we plan to help!  Based on what you have shared with me it looks like you have Allergic Rhinitis.  Rhinitis is when a reaction occurs that causes nasal congestion, runny nose, sneezing, and itching.  Most types of rhinitis are caused by an inflammation and are associated with symptoms in the eyes ears or throat. There are several types of rhinitis.  The most common are acute rhinitis, which is usually caused by a viral illness, allergic or seasonal rhinitis, and nonallergic or year-round rhinitis.  Nasal allergies occur certain times of the year.  Allergic rhinitis is caused when allergens in the air trigger the release of histamine in the body.  Histamine causes itching, swelling, and fluid to build up in the fragile linings of the nasal passages, sinuses and eyelids.  An itchy nose and clear discharge are common.  I recommend the following over the counter treatments: I have prescribed Cetirizine  10mg  Take 1 tablet daily   I also would recommend a nasal spray: I have prescribed Flonase  2 sprays into each nostril once daily  HOME CARE:  You can use an over-the-counter saline nasal spray as needed Avoid areas where there is heavy dust, mites, or molds Stay indoors on windy days during the pollen season Keep windows closed in home, at least in bedroom; use air conditioner. Use high-efficiency house air filter Keep windows closed in car, turn AC on re-circulate Avoid playing out with dog during pollen season  GET HELP RIGHT AWAY IF:  If your symptoms do not improve within 10 days You become short of breath You develop yellow or green discharge from your nose for over 3 days You have coughing fits  MAKE SURE YOU:  Understand these instructions Will watch your condition Will get help right away if you are not doing well or get worse  Thank you for choosing an e-visit. Your e-visit answers were  reviewed by a board certified advanced clinical practitioner to complete your personal care plan. Depending upon the condition, your plan could have included both over the counter or prescription medications. Please review your pharmacy choice. Be sure that the pharmacy you have chosen is open so that you can pick up your prescription now.  If there is a problem you may message your provider in MyChart to have the prescription routed to another pharmacy. Your safety is important to us . If you have drug allergies check your prescription carefully.  For the next 24 hours, you can use MyChart to ask questions about today's visit, request a non-urgent call back, or ask for a work or school excuse from your e-visit provider. You will get an email in the next two days asking about your experience. I hope that your e-visit has been valuable and will speed your recovery.         I have spent 5 minutes in review of e-visit questionnaire, review and updating patient chart, medical decision making and response to patient.   Delon CHRISTELLA Dickinson, PA-C

## 2024-04-30 ENCOUNTER — Telehealth: Payer: MEDICAID

## 2024-04-30 DIAGNOSIS — R3989 Other symptoms and signs involving the genitourinary system: Secondary | ICD-10-CM

## 2024-05-01 MED ORDER — NITROFURANTOIN MONOHYD MACRO 100 MG PO CAPS: 100.0000 mg | ORAL_CAPSULE | Freq: Two times a day (BID) | ORAL | 0 refills | Status: DC

## 2024-05-01 NOTE — Progress Notes (Signed)

## 2024-05-20 ENCOUNTER — Telehealth: Payer: MEDICAID | Admitting: Nurse Practitioner

## 2024-05-20 DIAGNOSIS — J40 Bronchitis, not specified as acute or chronic: Secondary | ICD-10-CM | POA: Diagnosis not present

## 2024-05-20 MED ORDER — DOXYCYCLINE HYCLATE 100 MG PO TABS: 100.0000 mg | ORAL_TABLET | Freq: Two times a day (BID) | ORAL | 0 refills | Status: AC

## 2024-05-20 NOTE — Progress Notes (Signed)
 Virtual Visit Consent   Jacqueline Jones, you are scheduled for a virtual visit with a [facility] provider today. Just as with appointments in the office, your consent must be obtained to participate. Your consent will be active for this visit and any virtual visit you may have with one of our providers in the next 365 days. If you have a MyChart account, a copy of this consent can be sent to you electronically.  As this is a virtual visit, video technology does not allow for your provider to perform a traditional examination. This may limit your provider's ability to fully assess your condition. If your provider identifies any concerns that need to be evaluated in person or the need to arrange testing (such as labs, EKG, etc.), we will make arrangements to do so. Although advances in technology are sophisticated, we cannot ensure that it will always work on either your end or our end. If the connection with a video visit is poor, the visit may have to be switched to a telephone visit. With either a video or telephone visit, we are not always able to ensure that we have a secure connection.  By engaging in this virtual visit, you consent to the provision of healthcare and authorize for your insurance to be billed (if applicable) for the services provided during this visit. Depending on your insurance coverage, you may receive a charge related to this service.  I need to obtain your verbal consent now. Are you willing to proceed with your visit today? Jacqueline Jones has provided verbal consent on 05/20/2024 for a virtual visit (video or telephone). [name], NP  Date: 05/20/2024 5:17 PM   Virtual Visit via Video Note   I, [name], connected with  Jacqueline Jones  ([id number], 22-Apr-1991) on 05/20/24 at  5:00 PM EDT by a video-enabled telemedicine application and verified that I am speaking with the correct person using two identifiers.  Location: Patient: Virtual Visit Location  Patient: Home Provider: Virtual Visit Location Provider: Home Office   I discussed the limitations of evaluation and management by telemedicine and the availability of in person appointments. The patient expressed understanding and agreed to proceed.    History of Present Illness: Jacqueline Jones is a 33 y.o. who identifies as a female who was assigned female at birth, and is being seen today for cough.  Over the past several days Jacqueline Jones has been experiencing increased productive cough with green and yellow sputum.  She reports a history of asthma and has been using her albuterol  inhaler.  She is also a current smoker.  Associated symptoms include runny nose and nasal congestion.  She denies any fever, shortness of breath or chest pain. States symptoms are similar to previous bronchitis symptoms for which she was treated with Doxy in the past.   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   Adhesive [Tape] Rash   Azithromycin Rash   Benzonatate Rash   Keflet  [Cephalexin ] Rash and Other (See Comments)    Did it involve swelling of the face/tongue/throat, SOB, or low BP? No Did it involve sudden or severe rash/hives, skin peeling, or any reaction on the inside of your mouth or nose? No Did you need to seek medical attention at a hospital or doctor's office? No When did it last happen?       If all above answers are NO, may proceed with cephalosporin use.    Latex Rash   Levaquin [Levofloxacin In D5w] Rash   Medications:  Current Outpatient Medications:    doxycycline  (VIBRA -TABS) 100 MG tablet, Take 1 tablet (100 mg total) by mouth 2 (two) times daily for 7 days., Disp: 14 tablet, Rfl: 0   albuterol  (VENTOLIN  HFA) 108 (90 Base) MCG/ACT inhaler, Inhale 1-2 puffs into the lungs every 6 (six) hours as needed., Disp: 8 g, Rfl: 0   baclofen  (LIORESAL ) 20 MG tablet, Take 20 mg by mouth 2 (two) times daily., Disp: , Rfl:    cetirizine  (ZYRTEC ) 10 MG tablet, Take 1 tablet (10 mg total) by mouth daily., Disp: 30 tablet, Rfl: 0   clonazePAM  (KLONOPIN ) 0.5 MG tablet, Take 0.5 mg by mouth 2 (two) times daily as needed for anxiety., Disp: , Rfl:    divalproex  (DEPAKOTE ) 250 MG DR tablet, Take 250 mg by mouth at bedtime., Disp: , Rfl:    fluticasone  (FLONASE ) 50 MCG/ACT nasal spray, Place 2 sprays into both nostrils daily., Disp: 16 g, Rfl: 0   levETIRAcetam  (KEPPRA ) 1000 MG tablet, Take 2,000 mg by mouth 2 (two) times daily., Disp: , Rfl:    mirtazapine  (REMERON ) 15 MG tablet, Take 15 mg by mouth at bedtime., Disp: , Rfl:    nitrofurantoin , macrocrystal-monohydrate, (MACROBID ) 100 MG capsule, Take 1 capsule (100 mg total) by mouth 2 (two) times daily., Disp: 10 capsule, Rfl: 0   pantoprazole  (PROTONIX ) 40 MG tablet, Take 1 tablet (40 mg total) by mouth daily., Disp: 30 tablet, Rfl: 3  Observations/Objective: Patient is well-developed, well-nourished in no  acute distress.  Resting comfortably at home.  Head is normocephalic, atraumatic.  No labored breathing.  Speech is clear and coherent with logical content.  Patient is alert and oriented at baseline.    Assessment and Plan: 1. Bronchitis (Primary) - doxycycline  (VIBRA -TABS) 100 MG tablet; Take 1 tablet (100 mg total) by mouth 2 (two) times daily for 7 days.  Dispense: 14 tablet; Refill: 0  Instructed that she will need a face-to-face visit with PCP if she experiences bronchitis symptoms within the next 2 to 3 months as she is already being treated for bronchitis 2 months ago and is being treated today  Follow Up Instructions: I discussed the assessment and treatment plan with the patient. The patient was provided an opportunity to ask questions and all were answered. The patient agreed with the plan and demonstrated an understanding of the instructions.  A copy of instructions were sent to the patient via  MyChart unless otherwise noted below.     The patient was advised to call back or seek an in-person evaluation if the symptoms worsen or if the condition fails to improve as anticipated.    Deshannon Hinchliffe W Orie Cuttino, NP

## 2024-05-20 NOTE — Patient Instructions (Signed)
 Jacqueline Jones, thank you for joining Jacqueline LELON Servant, NP for today's virtual visit.  While this provider is not your primary care provider (PCP), if your PCP is located in our provider database this encounter information will be shared with them immediately following your visit.   A [facility] MyChart account gives you access to today's visit and all your visits, tests, and labs performed at [facility]  click here if you don't have a [facility] MyChart account or go to [email or web address]  Consent: (Patient) Jacqueline Jones provided verbal consent for this virtual visit at the beginning of the encounter.  Current Medications:  Current Outpatient Medications:    doxycycline  (VIBRA -TABS) 100 MG tablet, Take 1 tablet (100 mg total) by mouth 2 (two) times daily for 7 days., Disp: 14 tablet, Rfl: 0   albuterol  (VENTOLIN  HFA) 108 (90 Base) MCG/ACT inhaler, Inhale 1-2 puffs into the lungs every 6 (six) hours as needed., Disp: 8 g, Rfl: 0   baclofen  (LIORESAL ) 20 MG tablet, Take 20 mg by mouth 2 (two) times daily., Disp: , Rfl:    cetirizine  (ZYRTEC ) 10 MG tablet, Take 1 tablet (10 mg total) by mouth daily., Disp: 30 tablet, Rfl: 0   clonazePAM  (KLONOPIN ) 0.5 MG tablet, Take 0.5 mg by mouth 2 (two) times daily as needed for anxiety., Disp: , Rfl:    divalproex  (DEPAKOTE ) 250 MG DR tablet, Take 250 mg by mouth at bedtime., Disp: , Rfl:    fluticasone  (FLONASE ) 50 MCG/ACT nasal spray, Place 2 sprays into both nostrils daily., Disp: 16 g, Rfl: 0   levETIRAcetam  (KEPPRA ) 1000 MG tablet, Take 2,000 mg by mouth 2 (two) times daily., Disp: , Rfl:    mirtazapine  (REMERON ) 15 MG tablet, Take 15 mg by mouth at bedtime., Disp: , Rfl:    nitrofurantoin , macrocrystal-monohydrate, (MACROBID ) 100 MG capsule, Take 1 capsule (100 mg total) by mouth 2 (two) times daily., Disp: 10 capsule, Rfl: 0   pantoprazole  (PROTONIX ) 40 MG tablet, Take 1 tablet (40 mg total) by mouth daily., Disp: 30  tablet, Rfl: 3   Medications ordered in this encounter:  Meds ordered this encounter  Medications   doxycycline  (VIBRA -TABS) 100 MG tablet    Sig: Take 1 tablet (100 mg total) by mouth 2 (two) times daily for 7 days.    Dispense:  14 tablet    Refill:  0    She took doxy in August with no allergic reaction    Supervising Provider:   BLAISE ALEENE KIDD [[id number]]     *If you need refills on other medications prior to your next appointment, please contact your pharmacy*  Follow-Up: Call back or seek an in-person evaluation if the symptoms worsen or if the condition fails to improve as anticipated.  [facility] [phone]  Other Instructions Instructed that she will need a face-to-face visit with PCP if she experiences bronchitis symptoms within the next 2 to 3 months as she is already being treated for bronchitis 2 months ago and is being treated today   If you have been instructed to have an in-person evaluation today at a local Urgent Care facility, please use the link below. It will take you to a list of all of our available [facility], including address, phone number and hours of operation. Please do not delay care.  [facility]  If you or a family member do not have a primary care provider, use the link below to  schedule a visit and establish care. When you choose a [facility] primary care physician or advanced practice provider, you gain a long-term partner in health. Find a Primary Care Provider  Learn more about [facility]'s in-office and virtual care options: [facility] - Get Care Now

## 2024-06-15 ENCOUNTER — Telehealth: Payer: MEDICAID | Admitting: Family Medicine

## 2024-06-15 DIAGNOSIS — M549 Dorsalgia, unspecified: Secondary | ICD-10-CM

## 2024-06-16 MED ORDER — BACLOFEN 20 MG PO TABS: 20.0000 mg | ORAL_TABLET | Freq: Three times a day (TID) | ORAL | 0 refills | Status: AC

## 2024-06-16 NOTE — Progress Notes (Signed)
 We are sorry that you are not feeling well.  Here is how we plan to help!  Based on what you have shared with me it looks like you mostly have acute back pain.  Acute back pain is defined as musculoskeletal pain that can resolve in 1-3 weeks with conservative treatment.  I have prescribed Baclofen  10 mg every eight hours as needed which is a muscle relaxer  Some patients experience stomach irritation or in increased heartburn with anti-inflammatory drugs.  Please keep in mind that muscle relaxer's can cause fatigue and should not be taken while at work or driving.  Back pain is very common.  The pain often gets better over time.  The cause of back pain is usually not dangerous.  Most people can learn to manage their back pain on their own.  Home Care Stay active.  Start with short walks on flat ground if you can.  Try to walk farther each day. Do not sit, drive or stand in one place for more than 30 minutes.  Do not stay in bed. Do not avoid exercise or work.  Activity can help your back heal faster. Be careful when you bend or lift an object.  Bend at your knees, keep the object close to you, and do not twist. Sleep on a firm mattress.  Lie on your side, and bend your knees.  If you lie on your back, put a pillow under your knees. Only take medicines as told by your doctor. Put ice on the injured area. Put ice in a plastic bag Place a towel between your skin and the bag Leave the ice on for 15-20 minutes, 3-4 times a day for the first 2-3 days. 210 After that, you can switch between ice and heat packs. Ask your doctor about back exercises or massage. Avoid feeling anxious or stressed.  Find good ways to deal with stress, such as exercise.  Get Help Right Way If: Your pain does not go away with rest or medicine. Your pain does not go away in 1 week. You have new problems. You do not feel well. The pain spreads into your legs. You cannot control when you poop (bowel movement) or pee  (urinate) You feel sick to your stomach (nauseous) or throw up (vomit) You have belly (abdominal) pain. You feel like you may pass out (faint). If you develop a fever.  Make Sure you: Understand these instructions. Will watch your condition Will get help right away if you are not doing well or get worse.  Your e-visit answers were reviewed by a board certified advanced clinical practitioner to complete your personal care plan.  Depending on the condition, your plan could have included both over the counter or prescription medications.  If there is a problem please reply  once you have received a response from your provider.  Your safety is important to us .  If you have drug allergies check your prescription carefully.    You can use MyChart to ask questions about today's visit, request a non-urgent call back, or ask for a work or school excuse for 24 hours related to this e-Visit. If it has been greater than 24 hours you will need to follow up with your provider, or enter a new e-Visit to address those concerns.  You will get an e-mail in the next two days asking about your experience.  I hope that your e-visit has been valuable and will speed your recovery. Thank you for using e-visits.  I have spent 5 minutes in review of e-visit questionnaire, review and updating patient chart, medical decision making and response to patient.   Roosvelt Mater, PA-C

## 2024-07-03 ENCOUNTER — Telehealth: Payer: MEDICAID | Admitting: Physician Assistant

## 2024-07-03 DIAGNOSIS — J069 Acute upper respiratory infection, unspecified: Secondary | ICD-10-CM

## 2024-07-03 NOTE — Progress Notes (Signed)
 Because of medication allergies/intolerances and medical history, there is not anything I can safely prescribe in an e-visit for your cough. As such, I feel your condition warrants further evaluation and I recommend that you be seen for a face to face visit.  Please contact your primary care physician practice to be seen. Many offices offer virtual options to be seen via video if you are not comfortable going in person to a medical facility at this time. There are things for cough that can be prescribed in a face-to-face visit that we are not allowed to prescribe via e-visit.  NOTE: You will NOT be charged for this eVisit.  If you do not have a PCP, [facility] offers a free physician referral service available at [phone]. Our trained staff has the experience, knowledge and resources to put you in touch with a physician who is right for you.    If you are having a true medical emergency please call 911.   Your e-visit answers were reviewed by a board certified advanced clinical practitioner to complete your personal care plan.  Thank you for using e-Visits.

## 2024-07-20 ENCOUNTER — Telehealth: Payer: MEDICAID | Admitting: Family Medicine

## 2024-07-20 DIAGNOSIS — R300 Dysuria: Secondary | ICD-10-CM | POA: Diagnosis not present

## 2024-07-20 MED ORDER — NITROFURANTOIN MONOHYD MACRO 100 MG PO CAPS: 100.0000 mg | ORAL_CAPSULE | Freq: Two times a day (BID) | ORAL | 0 refills | Status: AC

## 2024-07-20 NOTE — Progress Notes (Signed)

## 2024-08-05 ENCOUNTER — Encounter (HOSPITAL_COMMUNITY): Payer: Self-pay

## 2024-08-05 ENCOUNTER — Ambulatory Visit (HOSPITAL_COMMUNITY): Payer: MEDICAID

## 2024-08-05 ENCOUNTER — Ambulatory Visit (HOSPITAL_COMMUNITY)
Admission: EM | Admit: 2024-08-05 | Discharge: 2024-08-05 | Disposition: A | Discharge status: Home / Self Care | Payer: MEDICAID

## 2024-08-05 DIAGNOSIS — J0100 Acute maxillary sinusitis, unspecified: Secondary | ICD-10-CM | POA: Diagnosis not present

## 2024-08-05 DIAGNOSIS — J40 Bronchitis, not specified as acute or chronic: Secondary | ICD-10-CM

## 2024-08-05 DIAGNOSIS — R051 Acute cough: Secondary | ICD-10-CM

## 2024-08-05 MED ORDER — DOXYCYCLINE HYCLATE 100 MG PO CAPS: 100.0000 mg | ORAL_CAPSULE | Freq: Two times a day (BID) | ORAL | 0 refills | Status: AC

## 2024-08-05 MED ORDER — ALBUTEROL SULFATE HFA 108 (90 BASE) MCG/ACT IN AERS: 2.0000 | INHALATION_SPRAY | Freq: Four times a day (QID) | RESPIRATORY_TRACT | 0 refills | Status: AC | PRN

## 2024-08-05 NOTE — ED Triage Notes (Signed)
 Patient c/o a productive cough with yellow/green sputum, sneezing, sore throat, and nasal congestion. Patient states the cough is worse at night. Patient also c/o chest and back pain when she coughs. C/o symptoms x 1 1/2 weeks.   Patient has been taking TheraFlu for her symptoms.

## 2024-08-05 NOTE — Discharge Instructions (Addendum)
 Your evaluation shows you have a bacterial sinus infection plus a viral infection of the upper airways of your lungs. Use the following medicines to help with your symptoms:  Take doxycycline  (antibiotic) as prescribed each morning and each evening for 7 days with food.   Albuterol  inhaler 2 puffs every 4-6 hours as needed for cough, shortness of breath, and wheezing.  Tylenol  and/or ibuprofen  as needed for fever, body aches, pain.  Guaifenesin  (mucinex ) as needed for cough/nasal congestion.   Honey water, 2 teaspoons of honey dissolved in 1 cup warm water, drink every 4-6 hours for throat pains and cough.  Salt water gargles, 1/2-1 teaspoon of salt dissolved in 1 cup of warm water, gargle and spit out every 4-6 hours for throat pain.  Cool-mist humidifier in bedroom can help sooth cough at night.  If you develop any new or worsening symptoms or if your symptoms do not start to improve, please return here or follow-up with your primary care provider. If your symptoms are severe, please go to the emergency room.

## 2024-08-05 NOTE — ED Provider Notes (Signed)
 " [facility]    CSN: [id number] Arrival date & time: 08/05/24  9164      History   Chief Complaint Chief Complaint  Patient presents with   Nasal Congestion   Cough   Sore Throat    HPI Jacqueline Jones is a 34 y.o. female.   This 34 year old female is being seen for complaints of nasal congestion, sinus tenderness and pressure, sore throat, productive cough, chest pain and shortness of breath with cough, decreased appetite ongoing for a week and a half.  She reports her husband has been sick with similar symptoms.  She has been taking TheraFlu with minimal relief of symptoms.  She says her cough is worse at night.  She reports coughing up yellow and green phlegm.  She complains of right ear pain.  She denies headache, dizziness.  She denies abdominal pain, nausea, vomiting, diarrhea.   Cough Associated symptoms: chest pain, chills, ear pain, shortness of breath and sore throat   Associated symptoms: no fever, no headaches and no rash   Sore Throat Associated symptoms include chest pain and shortness of breath. Pertinent negatives include no abdominal pain and no headaches.    Past Medical History:  Diagnosis Date   Anemia    Anxiety    Anxiety    Asthma    Bipolar disorder (HCC)    Complication of anesthesia    Pt states she has had seizures during and after surgery   Depression    Depression    Elevated cholesterol    Enlarged liver    Frequent headaches    GERD (gastroesophageal reflux disease)    High blood pressure    IBS (irritable bowel syndrome)    Infection    UTI   Obesity    Pneumonia    PONV (postoperative nausea and vomiting)    Pre-diabetes    Seizures (HCC)    unknown cause- started 08/14  monthly   Sepsis (HCC)    Sleep apnea    No Cpap   UTI (lower urinary tract infection)     Patient Active Problem List   Diagnosis Date Noted   Cholelithiasis 01/16/2021   Gallstones 01/15/2021   Epigastric pain    Elevated LFTs     Hypokalemia    Non-intractable vomiting    QT prolongation    Overdose of sedative or hypnotic 11/06/2019   Major depressive disorder, recurrent episode, severe (HCC) 11/04/2019   Seizure (HCC) 11/03/2019   Medical non-compliance 05/13/2014   Neurologic abnormality 05/13/2014   Folliculitis 05/13/2014   Lower urinary tract infectious disease 02/18/2014   Pupil asymmetry 02/05/2014   Polysubstance dependence (HCC) 02/28/2012   GAD (generalized anxiety disorder) 09/02/2011    Past Surgical History:  Procedure Laterality Date   CHOLECYSTECTOMY N/A 01/22/2021   Procedure: LAPAROSCOPIC CHOLECYSTECTOMY WITH INTRAOPERATIVE CHOLANGIOGRAM;  Surgeon: Belinda Cough, MD;  Location: [facility] OR;  Service: General;  Laterality: N/A;   COLONOSCOPY     age 26 - [place]   DIAGNOSTIC LAPAROSCOPY     INSERTION OF MESH  02/09/2024   Procedure: INSERTION OF MESH;  Surgeon: Belinda Cough, MD;  Location: [facility] OR;  Service: General;;   VENTRAL HERNIA REPAIR N/A 02/09/2024   Procedure: REPAIR, HERNIA, VENTRAL;  Surgeon: Belinda Cough, MD;  Location: [facility] OR;  Service: General;  Laterality: N/A;    OB History     Gravida  0   Para  0   Term      Preterm  AB      Living         SAB      IAB      Ectopic      Multiple      Live Births               Home Medications    Prior to Admission medications  Medication Sig Start Date End Date Taking? Authorizing Provider  albuterol  (VENTOLIN  HFA) 108 (90 Base) MCG/ACT inhaler Inhale 2 puffs into the lungs every 6 (six) hours as needed for wheezing or shortness of breath. 08/05/24  Yes Amirra Herling C, FNP  doxycycline  (VIBRAMYCIN ) 100 MG capsule Take 1 capsule (100 mg total) by mouth 2 (two) times daily for 7 days. 08/05/24 08/12/24 Yes Zamiya Dillard C, FNP  LORazepam  (ATIVAN ) 0.5 MG tablet Take 0.5 mg by mouth every 8 (eight) hours.   Yes [provider]  cetirizine  (ZYRTEC ) 10 MG tablet Take 1 tablet (10 mg total) by mouth daily.  04/18/24   Vivienne Delon HERO, PA-C  clonazePAM  (KLONOPIN ) 0.5 MG tablet Take 0.5 mg by mouth 2 (two) times daily as needed for anxiety. Patient not taking: Reported on 08/05/2024    [provider]  divalproex  (DEPAKOTE ) 250 MG DR tablet Take 250 mg by mouth at bedtime. 01/02/24   [provider]  fluticasone  (FLONASE ) 50 MCG/ACT nasal spray Place 2 sprays into both nostrils daily. Patient not taking: Reported on 08/05/2024 04/18/24   Vivienne Delon HERO, PA-C  levETIRAcetam  (KEPPRA ) 1000 MG tablet Take 2,000 mg by mouth 2 (two) times daily. 12/03/20   [provider]  mirtazapine  (REMERON ) 15 MG tablet Take 15 mg by mouth at bedtime. Patient not taking: Reported on 08/05/2024 01/02/24   [provider]  pantoprazole  (PROTONIX ) 40 MG tablet Take 1 tablet (40 mg total) by mouth daily. 01/03/20   Kerman Vina HERO, NP    Family History Family History  Problem Relation Age of Onset   Drug abuse Father    Cancer Father        age orange   Lung cancer Father    Depression Mother    Anxiety disorder Mother    Drug abuse Brother        incarcirated    Colon cancer Neg Hx     Social History Social History[1]   Allergies   Dilaudid [hydromorphone], Toradol  [ketorolac  tromethamine ], Flexeril  [cyclobenzaprine ], Gabapentin, Vicodin [hydrocodone -acetaminophen ], Adhesive [tape], Azithromycin, Benzonatate, Keflet  [cephalexin ], Latex, and Levaquin [levofloxacin in d5w]   Review of Systems Review of Systems  Constitutional:  Positive for activity change, appetite change and chills. Negative for fever.  HENT:  Positive for congestion, ear pain, sinus pressure, sinus pain, sneezing and sore throat.   Respiratory:  Positive for cough and shortness of breath.   Cardiovascular:  Positive for chest pain.  Gastrointestinal:  Negative for abdominal pain, diarrhea, nausea and vomiting.  Skin:  Negative for color change and rash.  Neurological:  Negative for dizziness and  headaches.  All other systems reviewed and are negative.    Physical Exam Triage Vital Signs ED Triage Vitals [08/05/24 0943]  Encounter Vitals Group     BP 106/78     Girls Systolic BP Percentile      Girls Diastolic BP Percentile      Boys Systolic BP Percentile      Boys Diastolic BP Percentile      Pulse Rate 64     Resp 14  Temp 98.5 F (36.9 C)     Temp Source Oral     SpO2 96 %     Weight      Height      Head Circumference      Peak Flow      Pain Score 0     Pain Loc      Pain Education      Exclude from Growth Chart    No data found.  Updated Vital Signs BP 106/78 (BP Location: Left Arm)   Pulse 64   Temp 98.5 F (36.9 C) (Oral)   Resp 14   LMP 07/25/2024 (Exact Date)   SpO2 96%   Visual Acuity Right Eye Distance:   Left Eye Distance:   Bilateral Distance:    Right Eye Near:   Left Eye Near:    Bilateral Near:     Physical Exam Vitals and nursing note reviewed.  Constitutional:      General: She is not in acute distress.    Appearance: She is well-developed. She is not toxic-appearing.     Comments: Pleasant female appearing stated age found sitting in chair in no acute distress.  HENT:     Head: Normocephalic and atraumatic.     Right Ear: Tympanic membrane and external ear normal.     Left Ear: Tympanic membrane and external ear normal.     Nose: Congestion present.     Right Turbinates: Enlarged.     Left Turbinates: Enlarged.     Right Sinus: Maxillary sinus tenderness present.     Left Sinus: Maxillary sinus tenderness present.     Mouth/Throat:     Lips: Pink.     Mouth: Mucous membranes are moist.     Pharynx: No oropharyngeal exudate or posterior oropharyngeal erythema.  Eyes:     Conjunctiva/sclera: Conjunctivae normal.  Cardiovascular:     Rate and Rhythm: Normal rate and regular rhythm.     Heart sounds: Normal heart sounds. No murmur heard. Pulmonary:     Effort: Pulmonary effort is normal. No respiratory distress.      Breath sounds: Decreased breath sounds present.  Musculoskeletal:     Cervical back: Neck supple.  Lymphadenopathy:     Cervical: No cervical adenopathy.  Skin:    General: Skin is warm and dry.     Capillary Refill: Capillary refill takes less than 2 seconds.  Neurological:     Mental Status: She is alert.  Psychiatric:        Mood and Affect: Mood normal.      UC Treatments / Results  Labs (all labs ordered are listed, but only abnormal results are displayed) Labs Reviewed - No data to display  EKG   Radiology DG Chest 2 View Result Date: 08/05/2024 CLINICAL DATA:  Cough. EXAM: DG CHEST 2V COMPARISON:  January 09, 2023 FINDINGS: The heart size and mediastinal contours are within normal limits. Both lungs are clear. The visualized skeletal structures are unremarkable. IMPRESSION: No active cardiopulmonary disease. Electronically Signed   By: Suzen Dials M.D.   On: 08/05/2024 10:43    Procedures Procedures (including critical care time)  Medications Ordered in UC Medications - No data to display  Initial Impression / Assessment and Plan / UC Course  I have reviewed the triage vital signs and the nursing notes.  Pertinent labs & imaging results that were available during my care of the patient were reviewed by me and considered in my medical decision making (see  chart for details).     Vitals and triage reviewed, patient is hemodynamically stable.  Due to duration of symptoms, COVID and flu swabs deferred at this time.  Chest x-ray negative for acute findings.  Presentation consistent with sinusitis and bronchitis.  Due to allergies, unable to prescribe steroids.  She is given prescription for doxycycline  and albuterol  inhaler.  Advised Tylenol  and/or ibuprofen , guaifenesin .  Advised honey water, salt water gargles, cool-mist humidifier.  Plan of care, follow-up care, return precautions given, no questions at this time. Final Clinical Impressions(s) / UC Diagnoses    Final diagnoses:  Acute cough  Acute non-recurrent maxillary sinusitis  Bronchitis     Discharge Instructions      Your evaluation shows you have a bacterial sinus infection plus a viral infection of the upper airways of your lungs. Use the following medicines to help with your symptoms:  Take doxycycline  (antibiotic) as prescribed each morning and each evening for 7 days with food.   Albuterol  inhaler 2 puffs every 4-6 hours as needed for cough, shortness of breath, and wheezing.  Tylenol  and/or ibuprofen  as needed for fever, body aches, pain.  Guaifenesin  (mucinex ) as needed for cough/nasal congestion.   Honey water, 2 teaspoons of honey dissolved in 1 cup warm water, drink every 4-6 hours for throat pains and cough.  Salt water gargles, 1/2-1 teaspoon of salt dissolved in 1 cup of warm water, gargle and spit out every 4-6 hours for throat pain.  Cool-mist humidifier in bedroom can help sooth cough at night.  If you develop any new or worsening symptoms or if your symptoms do not start to improve, please return here or follow-up with your primary care provider. If your symptoms are severe, please go to the emergency room.      ED Prescriptions     Medication Sig Dispense Auth. Provider   doxycycline  (VIBRAMYCIN ) 100 MG capsule Take 1 capsule (100 mg total) by mouth 2 (two) times daily for 7 days. 14 capsule Reka Wist C, FNP   albuterol  (VENTOLIN  HFA) 108 (90 Base) MCG/ACT inhaler Inhale 2 puffs into the lungs every 6 (six) hours as needed for wheezing or shortness of breath. 1 each Arney Mayabb C, FNP      PDMP not reviewed this encounter.    [1]  Social History Tobacco Use   Smoking status: Every Day    Current packs/day: 0.25    Average packs/day: 0.3 packs/day for 10.0 years (2.5 ttl pk-yrs)    Types: Cigarettes   Smokeless tobacco: Former   Tobacco comments:    she is wanting to quit trying --  pt states smokes 3 cigs/day  Vaping Use   Vaping  status: Never Used  Substance Use Topics   Alcohol use: Not Currently   Drug use: Not Currently    Frequency: 7.0 times per week    Comment: In recovery     Lennice Jon BROCKS, FNP 08/05/24 1135  "
